# Patient Record
Sex: Female | Born: 1945
Health system: Southern US, Community
[De-identification: ages and names within clinical notes are randomized; demographics above are authoritative.]

## PROBLEM LIST (undated history)

## (undated) DIAGNOSIS — D472 Monoclonal gammopathy: Secondary | ICD-10-CM

## (undated) DIAGNOSIS — G4733 Obstructive sleep apnea (adult) (pediatric): Secondary | ICD-10-CM

## (undated) DIAGNOSIS — H269 Unspecified cataract: Secondary | ICD-10-CM

## (undated) DIAGNOSIS — I5189 Other ill-defined heart diseases: Secondary | ICD-10-CM

## (undated) DIAGNOSIS — G4730 Sleep apnea, unspecified: Secondary | ICD-10-CM

## (undated) DIAGNOSIS — K219 Gastro-esophageal reflux disease without esophagitis: Secondary | ICD-10-CM

## (undated) DIAGNOSIS — H9319 Tinnitus, unspecified ear: Secondary | ICD-10-CM

## (undated) DIAGNOSIS — E7800 Pure hypercholesterolemia, unspecified: Secondary | ICD-10-CM

## (undated) DIAGNOSIS — I700 Atherosclerosis of aorta: Secondary | ICD-10-CM

## (undated) DIAGNOSIS — Z860100 Personal history of colon polyps, unspecified: Secondary | ICD-10-CM

## (undated) DIAGNOSIS — K589 Irritable bowel syndrome without diarrhea: Secondary | ICD-10-CM

## (undated) DIAGNOSIS — I872 Venous insufficiency (chronic) (peripheral): Secondary | ICD-10-CM

## (undated) DIAGNOSIS — K449 Diaphragmatic hernia without obstruction or gangrene: Secondary | ICD-10-CM

## (undated) DIAGNOSIS — I340 Nonrheumatic mitral (valve) insufficiency: Secondary | ICD-10-CM

## (undated) DIAGNOSIS — N189 Chronic kidney disease, unspecified: Secondary | ICD-10-CM

## (undated) DIAGNOSIS — Z8619 Personal history of other infectious and parasitic diseases: Secondary | ICD-10-CM

## (undated) DIAGNOSIS — R112 Nausea with vomiting, unspecified: Secondary | ICD-10-CM

## (undated) DIAGNOSIS — Z9989 Dependence on other enabling machines and devices: Principal | ICD-10-CM

## (undated) DIAGNOSIS — M1990 Unspecified osteoarthritis, unspecified site: Secondary | ICD-10-CM

## (undated) DIAGNOSIS — R519 Headache, unspecified: Secondary | ICD-10-CM

## (undated) DIAGNOSIS — Z9889 Other specified postprocedural states: Secondary | ICD-10-CM

## (undated) DIAGNOSIS — T7840XA Allergy, unspecified, initial encounter: Secondary | ICD-10-CM

## (undated) DIAGNOSIS — R51 Headache: Secondary | ICD-10-CM

## (undated) DIAGNOSIS — M810 Age-related osteoporosis without current pathological fracture: Secondary | ICD-10-CM

## (undated) HISTORY — DX: Irritable bowel syndrome, unspecified: K58.9

## (undated) HISTORY — DX: Dependence on other enabling machines and devices: Z99.89

## (undated) HISTORY — DX: Tinnitus, unspecified ear: H93.19

## (undated) HISTORY — DX: Chronic kidney disease, unspecified: N18.9

## (undated) HISTORY — DX: Headache, unspecified: R51.9

## (undated) HISTORY — DX: Personal history of colon polyps, unspecified: Z86.0100

## (undated) HISTORY — DX: Personal history of colonic polyps: Z86.010

## (undated) HISTORY — DX: Personal history of other infectious and parasitic diseases: Z86.19

## (undated) HISTORY — DX: Allergy, unspecified, initial encounter: T78.40XA

## (undated) HISTORY — PX: ABDOMINAL HYSTERECTOMY: SHX81

## (undated) HISTORY — DX: Obstructive sleep apnea (adult) (pediatric): G47.33

## (undated) HISTORY — PX: DILATION AND CURETTAGE OF UTERUS: SHX78

## (undated) HISTORY — PX: OTHER SURGICAL HISTORY: SHX169

## (undated) HISTORY — PX: TONSILLECTOMY: SUR1361

## (undated) HISTORY — DX: Headache: R51

---

## 2005-12-09 DIAGNOSIS — K2960 Other gastritis without bleeding: Secondary | ICD-10-CM | POA: Insufficient documentation

## 2006-12-10 DIAGNOSIS — K449 Diaphragmatic hernia without obstruction or gangrene: Secondary | ICD-10-CM | POA: Insufficient documentation

## 2016-02-14 DIAGNOSIS — K5909 Other constipation: Secondary | ICD-10-CM | POA: Insufficient documentation

## 2016-02-14 DIAGNOSIS — K219 Gastro-esophageal reflux disease without esophagitis: Secondary | ICD-10-CM | POA: Insufficient documentation

## 2016-03-01 ENCOUNTER — Encounter: Payer: Self-pay | Admitting: Emergency Medicine

## 2016-03-01 ENCOUNTER — Emergency Department: Payer: Medicare Other

## 2016-03-01 ENCOUNTER — Emergency Department
Admission: EM | Admit: 2016-03-01 | Discharge: 2016-03-01 | Disposition: A | Discharge status: Home / Self Care | Payer: Medicare Other | Attending: Emergency Medicine | Admitting: Emergency Medicine

## 2016-03-01 DIAGNOSIS — R05 Cough: Secondary | ICD-10-CM | POA: Diagnosis not present

## 2016-03-01 DIAGNOSIS — R059 Cough, unspecified: Secondary | ICD-10-CM

## 2016-03-01 DIAGNOSIS — R197 Diarrhea, unspecified: Secondary | ICD-10-CM | POA: Diagnosis not present

## 2016-03-01 DIAGNOSIS — Z87891 Personal history of nicotine dependence: Secondary | ICD-10-CM | POA: Diagnosis not present

## 2016-03-01 DIAGNOSIS — Z79899 Other long term (current) drug therapy: Secondary | ICD-10-CM | POA: Insufficient documentation

## 2016-03-01 HISTORY — DX: Unspecified osteoarthritis, unspecified site: M19.90

## 2016-03-01 HISTORY — DX: Age-related osteoporosis without current pathological fracture: M81.0

## 2016-03-01 HISTORY — DX: Pure hypercholesterolemia, unspecified: E78.00

## 2016-03-01 LAB — COMPREHENSIVE METABOLIC PANEL
ALBUMIN: 3.7 g/dL (ref 3.5–5.0)
ALT: 17 U/L (ref 14–54)
AST: 26 U/L (ref 15–41)
Alkaline Phosphatase: 51 U/L (ref 38–126)
Anion gap: 9 (ref 5–15)
BUN: 17 mg/dL (ref 6–20)
CHLORIDE: 104 mmol/L (ref 101–111)
CO2: 21 mmol/L — ABNORMAL LOW (ref 22–32)
CREATININE: 0.9 mg/dL (ref 0.44–1.00)
Calcium: 8.5 mg/dL — ABNORMAL LOW (ref 8.9–10.3)
GFR calc Af Amer: >60 mL/min (ref >60)
GLUCOSE: 254 mg/dL — AB (ref 65–99)
POTASSIUM: 3.7 mmol/L (ref 3.5–5.1)
Sodium: 134 mmol/L — ABNORMAL LOW (ref 135–145)
Total Bilirubin: 0.5 mg/dL (ref 0.3–1.2)
Total Protein: 6.8 g/dL (ref 6.5–8.1)

## 2016-03-01 LAB — CBC WITH DIFFERENTIAL/PLATELET
Basophils Absolute: 0 10*3/uL (ref 0–0.1)
Basophils Relative: 1 %
EOS PCT: 0 %
Eosinophils Absolute: 0 10*3/uL (ref 0–0.7)
HEMATOCRIT: 39.7 % (ref 35.0–47.0)
Hemoglobin: 13.3 g/dL (ref 12.0–16.0)
LYMPHS ABS: 1.4 10*3/uL (ref 1.0–3.6)
LYMPHS PCT: 45 %
MCH: 30.2 pg (ref 26.0–34.0)
MCHC: 33.4 g/dL (ref 32.0–36.0)
MCV: 90.3 fL (ref 80.0–100.0)
MONO ABS: 0.1 10*3/uL — AB (ref 0.2–0.9)
MONOS PCT: 4 %
NEUTROS ABS: 1.5 10*3/uL (ref 1.4–6.5)
Neutrophils Relative %: 50 %
PLATELETS: 161 10*3/uL (ref 150–440)
RBC: 4.39 MIL/uL (ref 3.80–5.20)
RDW: 13.2 % (ref 11.5–14.5)
WBC: 3.1 10*3/uL — ABNORMAL LOW (ref 3.6–11.0)

## 2016-03-01 LAB — LIPASE, BLOOD: LIPASE: 63 U/L — AB (ref 11–51)

## 2016-03-01 LAB — GLUCOSE, CAPILLARY: Glucose-Capillary: 107 mg/dL — ABNORMAL HIGH (ref 65–99)

## 2016-03-01 MED ORDER — ALBUTEROL SULFATE HFA 108 (90 BASE) MCG/ACT IN AERS: 2.0000 | INHALATION_SPRAY | Freq: Four times a day (QID) | RESPIRATORY_TRACT | Status: DC | PRN

## 2016-03-01 MED ORDER — SODIUM CHLORIDE 0.9 % IV BOLUS (SEPSIS)
1000.0000 mL | Freq: Once | INTRAVENOUS | Status: AC
  Administered 2016-03-01: 1000 mL via INTRAVENOUS

## 2016-03-01 MED ORDER — IPRATROPIUM-ALBUTEROL 0.5-2.5 (3) MG/3ML IN SOLN
3.0000 mL | Freq: Once | RESPIRATORY_TRACT | Status: AC
  Administered 2016-03-01: 3 mL via RESPIRATORY_TRACT
  Filled 2016-03-01: qty 3

## 2016-03-01 MED ORDER — SODIUM CHLORIDE 0.9 % IV BOLUS (SEPSIS)
500.0000 mL | Freq: Once | INTRAVENOUS | Status: AC
  Administered 2016-03-01: 500 mL via INTRAVENOUS

## 2016-03-01 NOTE — ED Notes (Signed)
Pt in with co diarrhea for few days, today she started feeling dizzy.

## 2016-03-01 NOTE — ED Provider Notes (Addendum)
[facility] Emergency Department Provider Note  ____________________________________________   I have reviewed the triage vital signs and the nursing notes.   HISTORY  Chief Complaint Diarrhea    HPI Carol Stone is a 70 y.o. female presents today complaining of diarrhea. She is largely healthy person. She states she's had diarrhea for the last 3 days. Watery. She has had no recent antibiotics. She's had no recent travel. She has no history of C. difficile. She states that she felt a little lightheaded. She is eating and drinking well and affect medially prior to coming in she had 2 large sugar filled protein  shakes.She denies any fever or chills she denies any abdominal pain, she also states she has a slight cough which is been present for the last week or so which seems to be getting better. It is nonproductive. No fever or chills not vomited with any of these illnesses.  Past Medical History  Diagnosis Date  . Hypercholesteremia   . Arthritis   . Osteoporosis     There are no active problems to display for this patient.   History reviewed. No pertinent past surgical history.  Current Outpatient Rx  Name  Route  Sig  Dispense  Refill  . pravastatin (PRAVACHOL) 40 MG tablet   Oral   Take 40 mg by mouth daily.         . ranitidine (ZANTAC) 150 MG tablet   Oral   Take 150 mg by mouth 2 (two) times daily.         . simethicone (MYLICON) 0000000 MG chewable tablet   Oral   Chew 125 mg by mouth every 6 (six) hours as needed for flatulence.           Allergies Bactrim; Codeine; Compazine; Demerol; Indocin; and Reglan  History reviewed. No pertinent family history.  Social History Social History  Substance Use Topics  . Smoking status: Former Research scientist (life sciences)  . Smokeless tobacco: None  . Alcohol Use: No    Review of Systems Constitutional: No fever/chills Eyes: No visual changes. ENT: No sore throat. No stiff neck no neck  pain Cardiovascular: Denies chest pain. Respiratory: Denies shortness of breath. Gastrointestinal:   no vomiting.  Positive diarrhea.  No constipation. Genitourinary: Negative for dysuria. Musculoskeletal: Negative lower extremity swelling Skin: Negative for rash. Neurological: Negative for headaches, focal weakness or numbness. 10-point ROS otherwise negative.  ____________________________________________   PHYSICAL EXAM:  VITAL SIGNS: ED Triage Vitals  Enc Vitals Group     BP 03/01/16 0647 136/74 mmHg     Pulse Rate 03/01/16 0647 101     Resp 03/01/16 0647 18     Temp 03/01/16 0647 97.7 F (36.5 C)     Temp Source 03/01/16 0647 Oral     SpO2 03/01/16 0647 100 %     Weight 03/01/16 0647 123 lb (55.792 kg)     Height 03/01/16 0647 5\' 1"  (1.549 m)     Head Cir --      Peak Flow --      Pain Score 03/01/16 0648 0     Pain Loc --      Pain Edu? --      Excl. in [place]? --     Constitutional: Alert and oriented. Well appearing and in no acute distress. Eyes: Conjunctivae are normal. PERRL. EOMI. Head: Atraumatic. Nose: No congestion/rhinnorhea. Mouth/Throat: Mucous membranes are moist.  Oropharynx non-erythematous. Neck: No stridor.   Nontender with no meningismus Cardiovascular: Normal rate,  regular rhythm. Grossly normal heart sounds.  Good peripheral circulation. Respiratory: Normal respiratory effort.  No retractions. Lungs CTAB.Occasional dry cough appreciated Abdominal: Soft and nontender. No distention. No guarding no rebound Back:  There is no focal tenderness or step off there is no midline tenderness there are no lesions noted. there is no CVA tenderness Musculoskeletal: No lower extremity tenderness. No joint effusions, no DVT signs strong distal pulses no edema Neurologic:  Normal speech and language. No gross focal neurologic deficits are appreciated.  Skin:  Skin is warm, dry and intact. No rash noted. Psychiatric: Mood and affect are normal. Speech and behavior  are normal.  ____________________________________________   LABS (all labs ordered are listed, but only abnormal results are displayed)  Labs Reviewed  COMPREHENSIVE METABOLIC PANEL - Abnormal; Notable for the following:    Sodium 134 (*)    CO2 21 (*)    Glucose, Bld 254 (*)    Calcium 8.5 (*)    All other components within normal limits  LIPASE, BLOOD - Abnormal; Notable for the following:    Lipase 63 (*)    All other components within normal limits  CBC WITH DIFFERENTIAL/PLATELET - Abnormal; Notable for the following:    WBC 3.1 (*)    Monocytes Absolute 0.1 (*)    All other components within normal limits   ____________________________________________  EKG  I personally interpreted any EKGs ordered by me or triage  ____________________________________________  RADIOLOGY  I reviewed any imaging ordered by me or triage that were performed during my shift and, if possible, patient and/or family made aware of any abnormal findings. ____________________________________________   PROCEDURES  Procedure(s) performed: None  Critical Care performed: None  ____________________________________________   INITIAL IMPRESSION / ASSESSMENT AND PLAN / ED COURSE  Pertinent labs & imaging results that were available during my care of the patient were reviewed by me and considered in my medical decision making (see chart for details).  Patient with diarrheal illness, sugar somewhat up and just had a large amount of sugar to eat. We will recheck that. Giving her copious IV fluids. This is continuously supportive care. Abdomen is completely benign nothing to suggest diverticular other intra-abdominal pathology. No risk factors for C. difficile. Not likely amenable to antibiotics and in fact antibiotics will likely make things worse for her. Very strong correlation with likely viral illness. In addition, patient has a dry cough with no evidence of pneumonia. At her request we'll chest  x-ray although low suspicion for pneumonia.  ----------------------------------------- 9:38 AM on 03/01/2016 -----------------------------------------  Patient feels much better, we will check orthostatic vital signs after IV fluid is done. ____________________________________________   FINAL CLINICAL IMPRESSION(S) / ED DIAGNOSES  Final diagnoses:  None      This chart was dictated using voice recognition software.  Despite best efforts to proofread,  errors can occur which can change meaning.     Schuyler Amor, MD 03/01/16 [id number]  Schuyler Amor, MD 03/01/16 [phone]

## 2016-03-03 ENCOUNTER — Emergency Department: Payer: Medicare Other

## 2016-03-03 ENCOUNTER — Encounter: Payer: Self-pay | Admitting: Emergency Medicine

## 2016-03-03 ENCOUNTER — Emergency Department
Admission: EM | Admit: 2016-03-03 | Discharge: 2016-03-04 | Disposition: A | Discharge status: Home / Self Care | Payer: Medicare Other | Attending: Emergency Medicine | Admitting: Emergency Medicine

## 2016-03-03 DIAGNOSIS — K219 Gastro-esophageal reflux disease without esophagitis: Secondary | ICD-10-CM | POA: Diagnosis not present

## 2016-03-03 DIAGNOSIS — M1990 Unspecified osteoarthritis, unspecified site: Secondary | ICD-10-CM | POA: Insufficient documentation

## 2016-03-03 DIAGNOSIS — Z79899 Other long term (current) drug therapy: Secondary | ICD-10-CM | POA: Insufficient documentation

## 2016-03-03 DIAGNOSIS — M810 Age-related osteoporosis without current pathological fracture: Secondary | ICD-10-CM | POA: Diagnosis not present

## 2016-03-03 DIAGNOSIS — R0602 Shortness of breath: Secondary | ICD-10-CM | POA: Diagnosis present

## 2016-03-03 DIAGNOSIS — E782 Mixed hyperlipidemia: Secondary | ICD-10-CM | POA: Insufficient documentation

## 2016-03-03 DIAGNOSIS — K529 Noninfective gastroenteritis and colitis, unspecified: Secondary | ICD-10-CM

## 2016-03-03 DIAGNOSIS — Z87891 Personal history of nicotine dependence: Secondary | ICD-10-CM | POA: Insufficient documentation

## 2016-03-03 DIAGNOSIS — R197 Diarrhea, unspecified: Secondary | ICD-10-CM | POA: Diagnosis not present

## 2016-03-03 HISTORY — DX: Gastro-esophageal reflux disease without esophagitis: K21.9

## 2016-03-03 LAB — GASTROINTESTINAL PANEL BY PCR, STOOL (REPLACES STOOL CULTURE)
ASTROVIRUS: NOT DETECTED
Adenovirus F40/41: NOT DETECTED
Campylobacter species: NOT DETECTED
Cryptosporidium: NOT DETECTED
Cyclospora cayetanensis: NOT DETECTED
E. COLI O157: NOT DETECTED
ENTAMOEBA HISTOLYTICA: NOT DETECTED
ENTEROAGGREGATIVE E COLI (EAEC): NOT DETECTED
ENTEROTOXIGENIC E COLI (ETEC): NOT DETECTED
Enteropathogenic E coli (EPEC): NOT DETECTED
GIARDIA LAMBLIA: NOT DETECTED
NOROVIRUS GI/GII: NOT DETECTED
Plesimonas shigelloides: NOT DETECTED
Rotavirus A: NOT DETECTED
SHIGA LIKE TOXIN PRODUCING E COLI (STEC): NOT DETECTED
SHIGELLA/ENTEROINVASIVE E COLI (EIEC): NOT DETECTED
Salmonella species: NOT DETECTED
Sapovirus (I, II, IV, and V): NOT DETECTED
VIBRIO CHOLERAE: NOT DETECTED
Vibrio species: NOT DETECTED
Yersinia enterocolitica: NOT DETECTED

## 2016-03-03 LAB — COMPREHENSIVE METABOLIC PANEL
ALT: 32 U/L (ref 14–54)
AST: 40 U/L (ref 15–41)
Albumin: 4.1 g/dL (ref 3.5–5.0)
Alkaline Phosphatase: 63 U/L (ref 38–126)
Anion gap: 12 (ref 5–15)
BILIRUBIN TOTAL: 0.6 mg/dL (ref 0.3–1.2)
BUN: 10 mg/dL (ref 6–20)
CHLORIDE: 104 mmol/L (ref 101–111)
CO2: 19 mmol/L — ABNORMAL LOW (ref 22–32)
Calcium: 9.4 mg/dL (ref 8.9–10.3)
Creatinine, Ser: 0.68 mg/dL (ref 0.44–1.00)
Glucose, Bld: 103 mg/dL — ABNORMAL HIGH (ref 65–99)
POTASSIUM: 3.1 mmol/L — AB (ref 3.5–5.1)
Sodium: 135 mmol/L (ref 135–145)
TOTAL PROTEIN: 7.4 g/dL (ref 6.5–8.1)

## 2016-03-03 LAB — CBC
HEMATOCRIT: 41.4 % (ref 35.0–47.0)
Hemoglobin: 14.1 g/dL (ref 12.0–16.0)
MCH: 30.2 pg (ref 26.0–34.0)
MCHC: 34.2 g/dL (ref 32.0–36.0)
MCV: 88.5 fL (ref 80.0–100.0)
PLATELETS: 216 10*3/uL (ref 150–440)
RBC: 4.67 MIL/uL (ref 3.80–5.20)
RDW: 13 % (ref 11.5–14.5)
WBC: 3.7 10*3/uL (ref 3.6–11.0)

## 2016-03-03 LAB — C DIFFICILE QUICK SCREEN W PCR REFLEX
C DIFFICLE (CDIFF) ANTIGEN: NEGATIVE
C Diff interpretation: NEGATIVE
C Diff toxin: NEGATIVE

## 2016-03-03 LAB — TROPONIN I: Troponin I: <0.03 ng/mL (ref <0.031)

## 2016-03-03 MED ORDER — DIATRIZOATE MEGLUMINE & SODIUM 66-10 % PO SOLN
15.0000 mL | ORAL | Status: AC
  Administered 2016-03-03: 15 mL via ORAL

## 2016-03-03 MED ORDER — SODIUM CHLORIDE 0.9 % IV BOLUS (SEPSIS)
500.0000 mL | INTRAVENOUS | Status: AC
  Administered 2016-03-03: 500 mL via INTRAVENOUS

## 2016-03-03 MED ORDER — POTASSIUM CHLORIDE CRYS ER 20 MEQ PO TBCR
40.0000 meq | EXTENDED_RELEASE_TABLET | Freq: Once | ORAL | Status: AC
  Administered 2016-03-03: 40 meq via ORAL
  Filled 2016-03-03: qty 2

## 2016-03-03 MED ORDER — IOPAMIDOL (ISOVUE-370) INJECTION 76%
100.0000 mL | Freq: Once | INTRAVENOUS | Status: AC | PRN
  Administered 2016-03-03: 100 mL via INTRAVENOUS

## 2016-03-03 NOTE — ED Notes (Addendum)
Patient presents to the ED with tachypnea and shortness of breath since 2pm.  Patient was seen on Friday for shortness of breath and congestion with diarrhea x 6 days.  Patient reports the diarrhea has continued but is getting much better.  Patient appears very winded and her face appears reddened.  Patient denies any pain.

## 2016-03-03 NOTE — ED Provider Notes (Signed)
[facility] Emergency Department Provider Note  ____________________________________________  Time seen: Approximately 7:23 PM  I have reviewed the triage vital signs and the nursing notes.   HISTORY  Chief Complaint Shortness of Breath    HPI MAETTA BRANDSTETTER is a 70 y.o. female presents with persistent diarrhea for nearly a week.  She was seen about 3 days ago in the emergency Department for the same complaint.  The etiology of her diarrheal illness was unclear as she has no risk factors for C. difficile, no abdominal tenderness, no recent known sick contacts.  It was presumably a viral illness.  She was also noted to have a dry cough with no evidence of pneumonia and was given albuterol inhaler to try and help with a small amount of wheezing.  It was back today with persistent copious watery diarrhea with no blood.  It is greatly impacting her daily life as she is having so many episodes of bowel movements.  She continues to have no abdominal pain, just some mild cramping at times.  Additionally she is feeling increasingly short of breath and is noted to have tachypnea and even pursed lips upon arrival to the emergency department.  Overall she describes her symptoms as severe and nothing is making them better.  She wonders if the albuterol is actually making her feel worse.  She denies fever/chills, chest pain, dysuria.  She also denies N/V.   Past Medical History  Diagnosis Date  . Hypercholesteremia   . Arthritis   . Osteoporosis   . GERD (gastroesophageal reflux disease)     There are no active problems to display for this patient.   Past Surgical History  Procedure Laterality Date  . Abdominal hysterectomy      Current Outpatient Rx  Name  Route  Sig  Dispense  Refill  . pravastatin (PRAVACHOL) 40 MG tablet   Oral   Take 40 mg by mouth daily.         . ranitidine (ZANTAC) 150 MG tablet   Oral   Take 150 mg by mouth 2 (two) times daily.          . simethicone (MYLICON) 0000000 MG chewable tablet   Oral   Chew 125 mg by mouth every 6 (six) hours as needed for flatulence.         Marland Kitchen albuterol (PROVENTIL HFA;VENTOLIN HFA) 108 (90 Base) MCG/ACT inhaler   Inhalation   Inhale 2 puffs into the lungs every 6 (six) hours as needed for wheezing or shortness of breath.   1 Inhaler   2     Allergies Bactrim; Codeine; Compazine; Demerol; Indocin; and Reglan  No family history on file.  Social History Social History  Substance Use Topics  . Smoking status: Former Research scientist (life sciences)  . Smokeless tobacco: None  . Alcohol Use: No    Review of Systems Constitutional: No fever/chills Eyes: No visual changes. ENT: No sore throat. Cardiovascular: Denies chest pain. Respiratory: Denies shortness of breath. Gastrointestinal: No abdominal pain except mild intermittent cramping.  Copious watery diarrhea, no N/V. Genitourinary: Negative for dysuria. Musculoskeletal: Negative for back pain. Skin: Negative for rash. Neurological: Negative for headaches, focal weakness or numbness.  10-point ROS otherwise negative.  ____________________________________________   PHYSICAL EXAM:  VITAL SIGNS: ED Triage Vitals  Enc Vitals Group     BP 03/03/16 1734 154/78 mmHg     Pulse Rate 03/03/16 1734 94     Resp 03/03/16 1734 28     Temp 03/03/16  1734 97.5 F (36.4 C)     Temp Source 03/03/16 1734 Oral     SpO2 03/03/16 1734 100 %     Weight 03/03/16 1734 121 lb (54.885 kg)     Height 03/03/16 1734 5\' 1"  (1.549 m)     Head Cir --      Peak Flow --      Pain Score 03/03/16 1736 0     Pain Loc --      Pain Edu? --      Excl. in [place]? --     Constitutional: Alert and oriented. Well appearing in general but breathing rapidly and pursing lips at times. Eyes: Conjunctivae are normal. PERRL. EOMI. Head: Atraumatic. Nose: No congestion/rhinnorhea. Mouth/Throat: Mucous membranes are moist.  Oropharynx non-erythematous. Neck: No stridor.  No  meningeal signs.   Cardiovascular: Normal rate, regular rhythm. Good peripheral circulation. Grossly normal heart sounds.   Respiratory: Normal respiratory effort.  No retractions. Lungs CTAB. Gastrointestinal: Soft and nontender. No distention.  Musculoskeletal: No lower extremity tenderness nor edema. No gross deformities of extremities. Neurologic:  Normal speech and language. No gross focal neurologic deficits are appreciated.  Skin:  Skin is warm, dry and intact. No rash noted. Psychiatric: Mood and affect are normal. Speech and behavior are normal.  ____________________________________________   LABS (all labs ordered are listed, but only abnormal results are displayed)  Labs Reviewed  COMPREHENSIVE METABOLIC PANEL - Abnormal; Notable for the following:    Potassium 3.1 (*)    CO2 19 (*)    Glucose, Bld 103 (*)    All other components within normal limits  GASTROINTESTINAL PANEL BY PCR, STOOL (REPLACES STOOL CULTURE)  C DIFFICILE QUICK SCREEN W PCR REFLEX  CBC  TROPONIN I  URINALYSIS COMPLETEWITH MICROSCOPIC ([facility] ONLY)   ____________________________________________  EKG  ED ECG REPORT I, Reeder Brisby, the attending physician, personally viewed and interpreted this ECG.   Date: 03/03/2016  EKG Time: 17:33  Rate: 93  Rhythm: normal sinus rhythm  Axis: Normal  Intervals: Normal  ST&T Change: No evidence of acute ischemia  ____________________________________________  RADIOLOGY   Dg Chest 2 View  03/03/2016  CLINICAL DATA:  Worsening shortness of breath EXAM: CHEST  2 VIEW COMPARISON:  03/01/2016 FINDINGS: Lungs are clear.  No pleural effusion or pneumothorax. The heart is normal in size. Visualized osseous structures are within normal limits. IMPRESSION: No evidence of acute cardiopulmonary disease. Electronically Signed   By: Julian Hy M.D.   On: 03/03/2016 18:28    ____________________________________________   PROCEDURES  Procedure(s)  performed: None  Critical Care performed: No ____________________________________________   INITIAL IMPRESSION / ASSESSMENT AND PLAN / ED COURSE  Pertinent labs & imaging results that were available during my care of the patient were reviewed by me and considered in my medical decision making (see chart for details).  The patient has tachypnea consistently in the mid to upper 20s when I have evaluated her.  She purses her lips at times but has an unremarkable pulmonary exam.  Her chest x-ray is unremarkable.  It is possible that anxiety is congested into it, but the way she describes feeling winded like she has just exercise and cannot catch her breath, I am concerned about the possibility of a pulmonary embolism.  Additionally, I have evaluated her for diarrhea, but the GI pathogen panel is negative, greatly reducing the possibility that she has a specific viral or bacterial illness.  She has no significant tenderness to palpation.  However I  do not have a good reason for her diarrhea which is greatly affecting her life at this point.  I discussed with patient and her husband agreed to my plan of a CTA chest for rule out pulmonary embolism and CT abdomen and pelvis with by mouth and IV contrast to further evaluate the etiology of the diarrhea.  Transferred ED care to Dr. Marcelene Butte for ED follow up of CTA chest, CT abd/pelvis, and urinalysis.  ____________________________________________  FINAL CLINICAL IMPRESSION(S) / ED DIAGNOSES  Final diagnoses:  Severe diarrhea  Shortness of breath      NEW MEDICATIONS STARTED DURING THIS VISIT:  New Prescriptions   No medications on file      Note:  This document was prepared using Dragon voice recognition software and may include unintentional dictation errors.   Hinda Kehr, MD 03/03/16 2102

## 2016-03-04 DIAGNOSIS — R0602 Shortness of breath: Secondary | ICD-10-CM | POA: Diagnosis not present

## 2016-03-04 LAB — URINALYSIS COMPLETE WITH MICROSCOPIC (ARMC ONLY)
BACTERIA UA: NONE SEEN
BILIRUBIN URINE: NEGATIVE
GLUCOSE, UA: NEGATIVE mg/dL
Hgb urine dipstick: NEGATIVE
Leukocytes, UA: NEGATIVE
Nitrite: NEGATIVE
PH: 9 — AB (ref 5.0–8.0)
Protein, ur: NEGATIVE mg/dL
SQUAMOUS EPITHELIAL / LPF: NONE SEEN
Specific Gravity, Urine: 1.005 (ref 1.005–1.030)

## 2016-03-04 MED ORDER — LEVALBUTEROL TARTRATE 45 MCG/ACT IN AERO
2.0000 | INHALATION_SPRAY | RESPIRATORY_TRACT | Status: DC | PRN
Start: 2016-03-04 — End: 2017-04-18

## 2016-03-04 MED ORDER — METHYLPREDNISOLONE 4 MG PO TBPK: ORAL_TABLET | ORAL | Status: DC

## 2016-03-04 MED ORDER — CIPROFLOXACIN HCL 500 MG PO TABS
500.0000 mg | ORAL_TABLET | Freq: Once | ORAL | Status: AC
  Administered 2016-03-04: 500 mg via ORAL
  Filled 2016-03-04: qty 1

## 2016-03-04 MED ORDER — POTASSIUM CHLORIDE CRYS ER 20 MEQ PO TBCR: 40.0000 meq | EXTENDED_RELEASE_TABLET | Freq: Once | ORAL | Status: DC

## 2016-03-04 MED ORDER — CIPROFLOXACIN HCL 500 MG PO TABS: 500.0000 mg | ORAL_TABLET | Freq: Two times a day (BID) | ORAL | Status: DC

## 2016-03-04 NOTE — ED Provider Notes (Signed)
-----------------------------------------   23:45 PM on 03/04/2016 -----------------------------------------  Care assumed from Dr. Marcelene Butte. In short, this is a 70 year old female who presents with persistent diarrhea for nearly 1 week. She has had a prior recent emergency department visit for the same complaints. She is also having a dry cough for the past several days and was given an albuterol inhaler to help with her small amount of wheezing. Thus far, her biofire panel has been negative and she is pending CTA chest, CT abdomen/pelvis as well as urinalysis.  ----------------------------------------- 1:57 AM on 03/04/2016 -----------------------------------------  Updated patient and spouse of all results and had a lengthy discussion of her symptoms and course of management. For her wheezing and mild bronchiectasis, will start patient on Medrol dose pack. She feels very anxious while taking the albuterol inhaler; I will change this to Xopenex to lessen the side effects. As for the continued diarrhea, although the biofire panel and C. difficile are negative, we will try a three-day course of Cipro to see if this lessens patient's symptoms. Patient already received potassium replacement while in the emergency department. She will follow up closely with her PCP next week. Strict return precautions given. Both verbalize understanding the plan of care.  CTA chest, abdomen/pelvis interpreted per Dr. Radene Knee: 1. No evidence of pulmonary embolus. 2. Mild bilateral bronchiectasis noted. Lungs otherwise clear. 3. Dense calcification noted at the mitral valve. 4. Scattered diverticulosis along the mid sigmoid colon, without evidence of diverticulitis.  Paulette Blanch, MD 03/04/16 [phone]

## 2016-03-04 NOTE — Discharge Instructions (Signed)
1. Take antibiotic as prescribed (Cipro 500mg  twice daily x 3 days). 2. Take steroid taper as prescribed (Medrol dose pack). 3. Use Xopenex inhaler 2 puffs every 4 hours as need for wheezing. Use this instead of the albuterol inhaler. 4. Eat a BRAT diet for 1 week, then slowly advance diet as tolerated. 5. Return to the ER for worsening symptoms, persistent vomiting, difficult breathing or other concerns.  Food Choices to Help Relieve Diarrhea, Adult When you have diarrhea, the foods you eat and your eating habits are very important. Choosing the right foods and drinks can help relieve diarrhea. Also, because diarrhea can last up to 7 days, you need to replace lost fluids and electrolytes (such as sodium, potassium, and chloride) in order to help prevent dehydration.  WHAT GENERAL GUIDELINES DO I NEED TO FOLLOW?  Slowly drink 1 cup (8 oz) of fluid for each episode of diarrhea. If you are getting enough fluid, your urine will be clear or pale yellow.  Eat starchy foods. Some good choices include white rice, white toast, pasta, low-fiber cereal, baked potatoes (without the skin), saltine crackers, and bagels.  Avoid large servings of any cooked vegetables.  Limit fruit to two servings per day. A serving is  cup or 1 small piece.  Choose foods with less than 2 g of fiber per serving.  Limit fats to less than 8 tsp (38 g) per day.  Avoid fried foods.  Eat foods that have probiotics in them. Probiotics can be found in certain dairy products.  Avoid foods and beverages that may increase the speed at which food moves through the stomach and intestines (gastrointestinal tract). Things to avoid include:  High-fiber foods, such as dried fruit, raw fruits and vegetables, nuts, seeds, and whole grain foods.  Spicy foods and high-fat foods.  Foods and beverages sweetened with high-fructose corn syrup, honey, or sugar alcohols such as xylitol, sorbitol, and mannitol. WHAT FOODS ARE  RECOMMENDED? Grains White rice. White, [place], or pita breads (fresh or toasted), including plain rolls, buns, or bagels. White pasta. Saltine, soda, or graham crackers. Pretzels. Low-fiber cereal. Cooked cereals made with water (such as cornmeal, farina, or cream cereals). Plain muffins. Matzo. Melba toast. Zwieback.  Vegetables Potatoes (without the skin). Strained tomato and vegetable juices. Most well-cooked and canned vegetables without seeds. Tender lettuce. Fruits Cooked or canned applesauce, apricots, cherries, fruit cocktail, grapefruit, peaches, pears, or plums. Fresh bananas, apples without skin, cherries, grapes, cantaloupe, grapefruit, peaches, oranges, or plums.  Meat and Other Protein Products Baked or boiled chicken. Eggs. Tofu. Fish. Seafood. Smooth peanut butter. Ground or well-cooked tender beef, ham, veal, lamb, pork, or poultry.  Dairy Plain yogurt, kefir, and unsweetened liquid yogurt. Lactose-free milk, buttermilk, or soy milk. Plain hard cheese. Beverages Sport drinks. Clear broths. Diluted fruit juices (except prune). Regular, caffeine-free sodas such as ginger ale. Water. Decaffeinated teas. Oral rehydration solutions. Sugar-free beverages not sweetened with sugar alcohols. Other Bouillon, broth, or soups made from recommended foods.  The items listed above may not be a complete list of recommended foods or beverages. Contact your dietitian for more options. WHAT FOODS ARE NOT RECOMMENDED? Grains Whole grain, whole wheat, bran, or rye breads, rolls, pastas, crackers, and cereals. Wild or brown rice. Cereals that contain more than 2 g of fiber per serving. Corn tortillas or taco shells. Cooked or dry oatmeal. Granola. Popcorn. Vegetables Raw vegetables. Cabbage, broccoli, Brussels sprouts, artichokes, baked beans, beet greens, corn, kale, legumes, peas, sweet potatoes, and yams. Potato skins.  Cooked spinach and cabbage. Fruits Dried fruit, including raisins and dates.  Raw fruits. Stewed or dried prunes. Fresh apples with skin, apricots, mangoes, pears, raspberries, and strawberries.  Meat and Other Protein Products Chunky peanut butter. Nuts and seeds. Beans and lentils. Berniece Salines.  Dairy High-fat cheeses. Milk, chocolate milk, and beverages made with milk, such as milk shakes. Cream. Ice cream. Sweets and Desserts Sweet rolls, doughnuts, and sweet breads. Pancakes and waffles. Fats and Oils Butter. Cream sauces. Margarine. Salad oils. Plain salad dressings. Olives. Avocados.  Beverages Caffeinated beverages (such as coffee, tea, soda, or energy drinks). Alcoholic beverages. Fruit juices with pulp. Prune juice. Soft drinks sweetened with high-fructose corn syrup or sugar alcohols. Other Coconut. Hot sauce. Chili powder. Mayonnaise. Gravy. Cream-based or milk-based soups.  The items listed above may not be a complete list of foods and beverages to avoid. Contact your dietitian for more information. WHAT SHOULD I DO IF I BECOME DEHYDRATED? Diarrhea can sometimes lead to dehydration. Signs of dehydration include dark urine and dry mouth and skin. If you think you are dehydrated, you should rehydrate with an oral rehydration solution. These solutions can be purchased at pharmacies, retail stores, or online.  Drink -1 cup (120-240 mL) of oral rehydration solution each time you have an episode of diarrhea. If drinking this amount makes your diarrhea worse, try drinking smaller amounts more often. For example, drink 1-3 tsp (5-15 mL) every 5-10 minutes.  A general rule for staying hydrated is to drink 1-2 L of fluid per day. Talk to your health care provider about the specific amount you should be drinking each day. Drink enough fluids to keep your urine clear or pale yellow.   This information is not intended to replace advice given to you by your health care provider. Make sure you discuss any questions you have with your health care provider.   Document Released:  02/15/2004 Document Revised: 12/16/2014 Document Reviewed: 10/18/2013 Elsevier Interactive Patient Education 2016 [place] of Breath Shortness of breath means you have trouble breathing. Shortness of breath needs medical care right away. HOME CARE   Do not smoke.  Avoid being around chemicals or things (paint fumes, dust) that may bother your breathing.  Rest as needed. Slowly begin your normal activities.  Only take medicines as told by your doctor.  Keep all doctor visits as told. GET HELP RIGHT AWAY IF:   Your shortness of breath gets worse.  You feel lightheaded, pass out (faint), or have a cough that is not helped by medicine.  You cough up blood.  You have pain with breathing.  You have pain in your chest, arms, shoulders, or belly (abdomen).  You have a fever.  You cannot walk up stairs or exercise the way you normally do.  You do not get better in the time expected.  You have a hard time doing normal activities even with rest.  You have problems with your medicines.  You have any new symptoms. MAKE SURE YOU:  Understand these instructions.  Will watch your condition.  Will get help right away if you are not doing well or get worse.   This information is not intended to replace advice given to you by your health care provider. Make sure you discuss any questions you have with your health care provider.   Document Released: 05/13/2008 Document Revised: 11/30/2013 Document Reviewed: 02/10/2012 Elsevier Interactive Patient Education [employer].

## 2016-03-13 ENCOUNTER — Encounter: Payer: Self-pay | Admitting: *Deleted

## 2016-03-14 ENCOUNTER — Ambulatory Visit
Admission: RE | Admit: 2016-03-14 | Discharge: 2016-03-14 | Disposition: A | Discharge status: Home / Self Care | Payer: Medicare Other | Source: Ambulatory Visit | Attending: Gastroenterology | Admitting: Gastroenterology

## 2016-03-14 ENCOUNTER — Ambulatory Visit: Payer: Medicare Other | Admitting: Anesthesiology

## 2016-03-14 ENCOUNTER — Encounter
Admission: RE | Disposition: A | Discharge status: Home / Self Care | Payer: Self-pay | Source: Ambulatory Visit | Attending: Gastroenterology

## 2016-03-14 ENCOUNTER — Encounter: Payer: Self-pay | Admitting: *Deleted

## 2016-03-14 DIAGNOSIS — Z79899 Other long term (current) drug therapy: Secondary | ICD-10-CM | POA: Insufficient documentation

## 2016-03-14 DIAGNOSIS — I739 Peripheral vascular disease, unspecified: Secondary | ICD-10-CM | POA: Insufficient documentation

## 2016-03-14 DIAGNOSIS — Z87891 Personal history of nicotine dependence: Secondary | ICD-10-CM | POA: Insufficient documentation

## 2016-03-14 DIAGNOSIS — Z882 Allergy status to sulfonamides status: Secondary | ICD-10-CM | POA: Diagnosis not present

## 2016-03-14 DIAGNOSIS — E7800 Pure hypercholesterolemia, unspecified: Secondary | ICD-10-CM | POA: Insufficient documentation

## 2016-03-14 DIAGNOSIS — R197 Diarrhea, unspecified: Secondary | ICD-10-CM | POA: Diagnosis not present

## 2016-03-14 DIAGNOSIS — G4730 Sleep apnea, unspecified: Secondary | ICD-10-CM | POA: Insufficient documentation

## 2016-03-14 DIAGNOSIS — K219 Gastro-esophageal reflux disease without esophagitis: Secondary | ICD-10-CM | POA: Diagnosis not present

## 2016-03-14 DIAGNOSIS — Z90710 Acquired absence of both cervix and uterus: Secondary | ICD-10-CM | POA: Insufficient documentation

## 2016-03-14 DIAGNOSIS — K5730 Diverticulosis of large intestine without perforation or abscess without bleeding: Secondary | ICD-10-CM | POA: Diagnosis not present

## 2016-03-14 DIAGNOSIS — M1990 Unspecified osteoarthritis, unspecified site: Secondary | ICD-10-CM | POA: Insufficient documentation

## 2016-03-14 DIAGNOSIS — M810 Age-related osteoporosis without current pathological fracture: Secondary | ICD-10-CM | POA: Diagnosis not present

## 2016-03-14 DIAGNOSIS — Z888 Allergy status to other drugs, medicaments and biological substances status: Secondary | ICD-10-CM | POA: Insufficient documentation

## 2016-03-14 DIAGNOSIS — Z885 Allergy status to narcotic agent status: Secondary | ICD-10-CM | POA: Insufficient documentation

## 2016-03-14 HISTORY — DX: Sleep apnea, unspecified: G47.30

## 2016-03-14 HISTORY — DX: Venous insufficiency (chronic) (peripheral): I87.2

## 2016-03-14 HISTORY — DX: Unspecified cataract: H26.9

## 2016-03-14 HISTORY — PX: COLONOSCOPY WITH PROPOFOL: SHX5780

## 2016-03-14 SURGERY — COLONOSCOPY WITH PROPOFOL
Anesthesia: General

## 2016-03-14 MED ORDER — SODIUM CHLORIDE 0.9 % IV SOLN
INTRAVENOUS | Status: DC
  Administered 2016-03-14: 1000 mL via INTRAVENOUS

## 2016-03-14 MED ORDER — LIDOCAINE HCL (CARDIAC) 20 MG/ML IV SOLN
INTRAVENOUS | Status: DC | PRN
  Administered 2016-03-14: 100 mg via INTRAVENOUS

## 2016-03-14 MED ORDER — PROPOFOL 10 MG/ML IV BOLUS
INTRAVENOUS | Status: DC | PRN
  Administered 2016-03-14: 30 mg via INTRAVENOUS
  Administered 2016-03-14: 60 mg via INTRAVENOUS

## 2016-03-14 MED ORDER — PROPOFOL 500 MG/50ML IV EMUL
INTRAVENOUS | Status: DC | PRN
  Administered 2016-03-14: 100 ug/kg/min via INTRAVENOUS

## 2016-03-14 MED ORDER — SODIUM CHLORIDE 0.9 % IV SOLN: INTRAVENOUS | Status: DC

## 2016-03-14 NOTE — Anesthesia Postprocedure Evaluation (Signed)
Anesthesia Post Note  Patient: ANU CAMILLERI  Procedure(s) Performed: Procedure(s) (LRB): COLONOSCOPY WITH PROPOFOL (N/A)  Patient location during evaluation: PACU Anesthesia Type: General Level of consciousness: awake and alert Pain management: pain level controlled Vital Signs Assessment: post-procedure vital signs reviewed and stable Respiratory status: spontaneous breathing, nonlabored ventilation, respiratory function stable and patient connected to nasal cannula oxygen Cardiovascular status: blood pressure returned to baseline and stable Postop Assessment: no signs of nausea or vomiting Anesthetic complications: no    Last Vitals:  Filed Vitals:   03/14/16 1425 03/14/16 1622  BP: 161/78 101/53  Pulse: 104 84  Temp: 36.4 C 36 C  Resp: 20 15    Last Pain: There were no vitals filed for this visit.               Molli Barrows

## 2016-03-14 NOTE — Transfer of Care (Signed)
Immediate Anesthesia Transfer of Care Note  Patient: Carol Stone  Procedure(s) Performed: Procedure(s): COLONOSCOPY WITH PROPOFOL (N/A)  Patient Location: PACU  Anesthesia Type:General  Level of Consciousness: sedated  Airway & Oxygen Therapy: Patient Spontanous Breathing and Patient connected to nasal cannula oxygen  Post-op Assessment: Report given to RN and Post -op Vital signs reviewed and stable  Post vital signs: Reviewed and stable  Last Vitals:  Filed Vitals:   03/14/16 1425  BP: 161/78  Pulse: 104  Temp: 36.4 C  Resp: 20    Complications: No apparent anesthesia complications

## 2016-03-14 NOTE — Op Note (Signed)
[facility] Patient Name: Carol Stone Procedure Date: 03/14/2016 3:58 PM MRN: [id number] Account #: [id number] Date of Birth: [birth year] Admit Type: Outpatient Age: 70 Room: [facility] ENDO ROOM 4 Gender: Female Note Status: Finalized Procedure:            Colonoscopy Indications:          Diarrhea, Diarhea stopped after one day of medrol dose                        pak. Now on Brat diet.. Providers:            Lupita Dawn. Candace Cruise, MD Referring MD:         Youlanda Roys. Lovie Macadamia, MD (Referring MD) Medicines:            Monitored Anesthesia Care Complications:        No immediate complications. Procedure:            Pre-Anesthesia Assessment:                       - Prior to the procedure, a History and Physical was                        performed, and patient medications, allergies and                        sensitivities were reviewed. The patient's tolerance of                        previous anesthesia was reviewed.                       - The risks and benefits of the procedure and the                        sedation options and risks were discussed with the                        patient. All questions were answered and informed                        consent was obtained.                       - After reviewing the risks and benefits, the patient                        was deemed in satisfactory condition to undergo the                        procedure.                       After obtaining informed consent, the colonoscope was                        passed under direct vision. Throughout the procedure,                        the patient's blood pressure, pulse, and oxygen  saturations were monitored continuously. The                        Colonoscope was introduced through the anus and                        advanced to the the cecum, identified by appendiceal                        orifice and ileocecal valve. The colonoscopy was                         performed without difficulty. The patient tolerated the                        procedure well. The quality of the bowel preparation                        was good. Findings:      Multiple small-mouthed diverticula were found in the sigmoid colon.       Biopsies for histology were taken with a cold forceps from the entire       colon for evaluation of microscopic colitis.      The exam was otherwise without abnormality. Impression:           - Diverticulosis in the sigmoid colon. Biopsied.                       - The examination was otherwise normal. Recommendation:       - Discharge patient to home.                       - Await pathology results.                       - The findings and recommendations were discussed with                        the patient. Procedure Code(s):    --- Professional ---                       [phone], Colonoscopy, flexible; with biopsy, single or                        multiple Diagnosis Code(s):    --- Professional ---                       R19.7, Diarrhea, unspecified                       K57.30, Diverticulosis of large intestine without                        perforation or abscess without bleeding CPT copyright 2016 American Medical Association. All rights reserved. The codes documented in this report are preliminary and upon coder review may  be revised to meet current compliance requirements. Hulen Luster, MD 03/14/2016 4:15:57 PM This report has been signed electronically. Number of Addenda: 0 Note Initiated On: 03/14/2016 3:58 PM Scope Withdrawal Time: 0 hours 4 minutes 37 seconds  Total Procedure Duration: 0 hours 10 minutes 6 seconds  [facility]

## 2016-03-14 NOTE — Anesthesia Preprocedure Evaluation (Signed)
Anesthesia Evaluation  Patient identified by MRN, date of birth, ID band Patient awake    Reviewed: Allergy & Precautions, H&P , NPO status , Patient's Chart, lab work & pertinent test results, reviewed documented beta blocker date and time   Airway Mallampati: II   Neck ROM: full    Dental  (+) Poor Dentition   Pulmonary neg pulmonary ROS, former smoker,    Pulmonary exam normal        Cardiovascular + Peripheral Vascular Disease  negative cardio ROS Normal cardiovascular exam     Neuro/Psych negative neurological ROS  negative psych ROS   GI/Hepatic negative GI ROS, Neg liver ROS, GERD  ,  Endo/Other  negative endocrine ROS  Renal/GU negative Renal ROS  negative genitourinary   Musculoskeletal   Abdominal   Peds  Hematology negative hematology ROS (+)   Anesthesia Other Findings Past Medical History:   Hypercholesteremia                                           Arthritis                                                    Osteoporosis                                                 GERD (gastroesophageal reflux disease)                       Cataract                                                     Sleep apnea                                                  Venous insufficiency                                       Past Surgical History:   ABDOMINAL HYSTERECTOMY                                        DILATION AND CURETTAGE OF UTERUS                              Reproductive/Obstetrics                             Anesthesia Physical Anesthesia Plan  ASA: III  Anesthesia Plan: General   Post-op Pain Management:    Induction:  Airway Management Planned:   Additional Equipment:   Intra-op Plan:   Post-operative Plan:   Informed Consent: I have reviewed the patients History and Physical, chart, labs and discussed the procedure including the risks, benefits and  alternatives for the proposed anesthesia with the patient or authorized representative who has indicated his/her understanding and acceptance.   Dental Advisory Given  Plan Discussed with: CRNA  Anesthesia Plan Comments:         Anesthesia Quick Evaluation

## 2016-03-14 NOTE — H&P (Signed)
  Date of Initial H&P: 03/07/2016  History reviewed, patient examined, no change in status, stable for surgery.

## 2016-03-18 LAB — SURGICAL PATHOLOGY

## 2016-03-19 ENCOUNTER — Encounter: Payer: Self-pay | Admitting: Gastroenterology

## 2016-08-08 ENCOUNTER — Other Ambulatory Visit: Payer: Self-pay | Admitting: Family Medicine

## 2016-08-08 DIAGNOSIS — Z1231 Encounter for screening mammogram for malignant neoplasm of breast: Secondary | ICD-10-CM

## 2016-08-16 DIAGNOSIS — M4850XD Collapsed vertebra, not elsewhere classified, site unspecified, subsequent encounter for fracture with routine healing: Secondary | ICD-10-CM | POA: Insufficient documentation

## 2016-08-27 ENCOUNTER — Other Ambulatory Visit: Payer: Self-pay | Admitting: Family Medicine

## 2016-08-27 ENCOUNTER — Ambulatory Visit
Admission: RE | Admit: 2016-08-27 | Discharge: 2016-08-27 | Disposition: A | Discharge status: Home / Self Care | Payer: Medicare Other | Source: Ambulatory Visit | Attending: Family Medicine | Admitting: Family Medicine

## 2016-08-27 DIAGNOSIS — Z1231 Encounter for screening mammogram for malignant neoplasm of breast: Secondary | ICD-10-CM | POA: Diagnosis present

## 2017-01-28 DIAGNOSIS — S299XXA Unspecified injury of thorax, initial encounter: Secondary | ICD-10-CM | POA: Diagnosis not present

## 2017-01-28 DIAGNOSIS — Z9181 History of falling: Secondary | ICD-10-CM | POA: Diagnosis not present

## 2017-01-28 DIAGNOSIS — R0781 Pleurodynia: Secondary | ICD-10-CM | POA: Diagnosis not present

## 2017-04-17 DIAGNOSIS — J301 Allergic rhinitis due to pollen: Secondary | ICD-10-CM | POA: Diagnosis not present

## 2017-04-17 DIAGNOSIS — R42 Dizziness and giddiness: Secondary | ICD-10-CM | POA: Diagnosis not present

## 2017-04-17 DIAGNOSIS — H6980 Other specified disorders of Eustachian tube, unspecified ear: Secondary | ICD-10-CM | POA: Diagnosis not present

## 2017-04-17 DIAGNOSIS — H903 Sensorineural hearing loss, bilateral: Secondary | ICD-10-CM | POA: Diagnosis not present

## 2017-04-17 DIAGNOSIS — H9319 Tinnitus, unspecified ear: Secondary | ICD-10-CM | POA: Diagnosis not present

## 2017-04-18 ENCOUNTER — Ambulatory Visit (INDEPENDENT_AMBULATORY_CARE_PROVIDER_SITE_OTHER): Payer: Medicare Other | Admitting: Obstetrics and Gynecology

## 2017-04-18 ENCOUNTER — Encounter: Payer: Self-pay | Admitting: Obstetrics and Gynecology

## 2017-04-18 VITALS — BP 141/80 | HR 92 | Ht 59.0 in | Wt 138.4 lb

## 2017-04-18 DIAGNOSIS — Z01419 Encounter for gynecological examination (general) (routine) without abnormal findings: Secondary | ICD-10-CM

## 2017-04-18 NOTE — Progress Notes (Signed)
Subjective:     Carol Stone is a 71 y.o. female G0 postmenopausal who is here for a comprehensive physical exam. The patient reports no problems. She is not currently sexually active. She underwent a hysterectomy for fibroids in the 1980's. She denies any pelvic pain or abnormal discharge. She is been followed by [facility] for HTN and osteoporosis. She exercises regularly and walks 3 miles daily  Past Medical History:  Diagnosis Date  . Arthritis   . Cataract   . GERD (gastroesophageal reflux disease)   . Hypercholesteremia   . Osteoporosis   . Sleep apnea   . Tinnitus   . Venous insufficiency    Past Surgical History:  Procedure Laterality Date  . ABDOMINAL HYSTERECTOMY    . COLONOSCOPY WITH PROPOFOL N/A 03/14/2016   Procedure: COLONOSCOPY WITH PROPOFOL;  Surgeon: Hulen Luster, MD;  [facility] ENDOSCOPY;  Service: Gastroenterology;  Laterality: N/A;  . DILATION AND CURETTAGE OF UTERUS     Family History  Problem Relation Age of Onset  . Prostate cancer Father   . Lung cancer Sister   . Breast cancer Maternal Aunt 52    Social History   Social History  . Marital status: Married    Spouse name: N/A  . Number of children: N/A  . Years of education: N/A   Occupational History  . Not on file.   Social History Main Topics  . Smoking status: Former Research scientist (life sciences)  . Smokeless tobacco: Never Used  . Alcohol use No  . Drug use: No  . Sexual activity: Not Currently    Birth control/ protection: Surgical   Other Topics Concern  . Not on file   Social History Narrative  . No narrative on file   Health Maintenance  Topic Date Due  . Hepatitis C Screening  1946-10-08  . TETANUS/TDAP  09/21/1964  . MAMMOGRAM  09/22/1995  . DEXA SCAN  09/21/2010  . PNA vac Low Risk Adult (1 of 2 - PCV13) 09/21/2010  . INFLUENZA VACCINE  07/09/2017  . COLONOSCOPY  03/14/2026       Review of Systems Pertinent items are noted in HPI.   Objective:  Blood pressure (!) 141/80, pulse 92, height  4\' 11"  (1.499 m), weight 138 lb 6.4 oz (62.8 kg).     GENERAL: Well-developed, well-nourished female in no acute distress.  HEENT: Normocephalic, atraumatic. Sclerae anicteric.  NECK: Supple. Normal thyroid.  LUNGS: Clear to auscultation bilaterally.  HEART: Regular rate and rhythm. BREASTS: Symmetric in size. No palpable masses or lymphadenopathy, skin changes, or nipple drainage. ABDOMEN: Soft, nontender, nondistended. No organomegaly. PELVIC: Normal external female genitalia. Vagina is atrophic.  Normal discharge. Vaginal vault is intact. No adnexal mass or tenderness. EXTREMITIES: No cyanosis, clubbing, or edema, 2+ distal pulses.    Assessment:    Healthy female exam.      Plan:    Pap smear not indicated Screening mammogram due in fall Follow up with PCP for routine care See After Visit Summary for Counseling Recommendations

## 2017-04-18 NOTE — Progress Notes (Signed)
Pt presents for Annual no concerns per pt.

## 2017-07-23 ENCOUNTER — Other Ambulatory Visit: Payer: Self-pay | Admitting: Family Medicine

## 2017-07-23 DIAGNOSIS — Z1231 Encounter for screening mammogram for malignant neoplasm of breast: Secondary | ICD-10-CM

## 2017-07-31 DIAGNOSIS — X32XXXA Exposure to sunlight, initial encounter: Secondary | ICD-10-CM | POA: Diagnosis not present

## 2017-07-31 DIAGNOSIS — D2262 Melanocytic nevi of left upper limb, including shoulder: Secondary | ICD-10-CM | POA: Diagnosis not present

## 2017-07-31 DIAGNOSIS — D225 Melanocytic nevi of trunk: Secondary | ICD-10-CM | POA: Diagnosis not present

## 2017-07-31 DIAGNOSIS — L570 Actinic keratosis: Secondary | ICD-10-CM | POA: Diagnosis not present

## 2017-07-31 DIAGNOSIS — L718 Other rosacea: Secondary | ICD-10-CM | POA: Diagnosis not present

## 2017-07-31 DIAGNOSIS — L821 Other seborrheic keratosis: Secondary | ICD-10-CM | POA: Diagnosis not present

## 2017-08-04 IMAGING — CR DG CHEST 2V
1 series · 2 of 2 positions shown · non-contrast
Comparison: None.

CLINICAL DATA: Dehydration and cough

EXAM:
CHEST  2 VIEW

[Series 1: dg chest 2 view · 0.14mm/px · 2 of 2 slices shown]
[im 1/2]
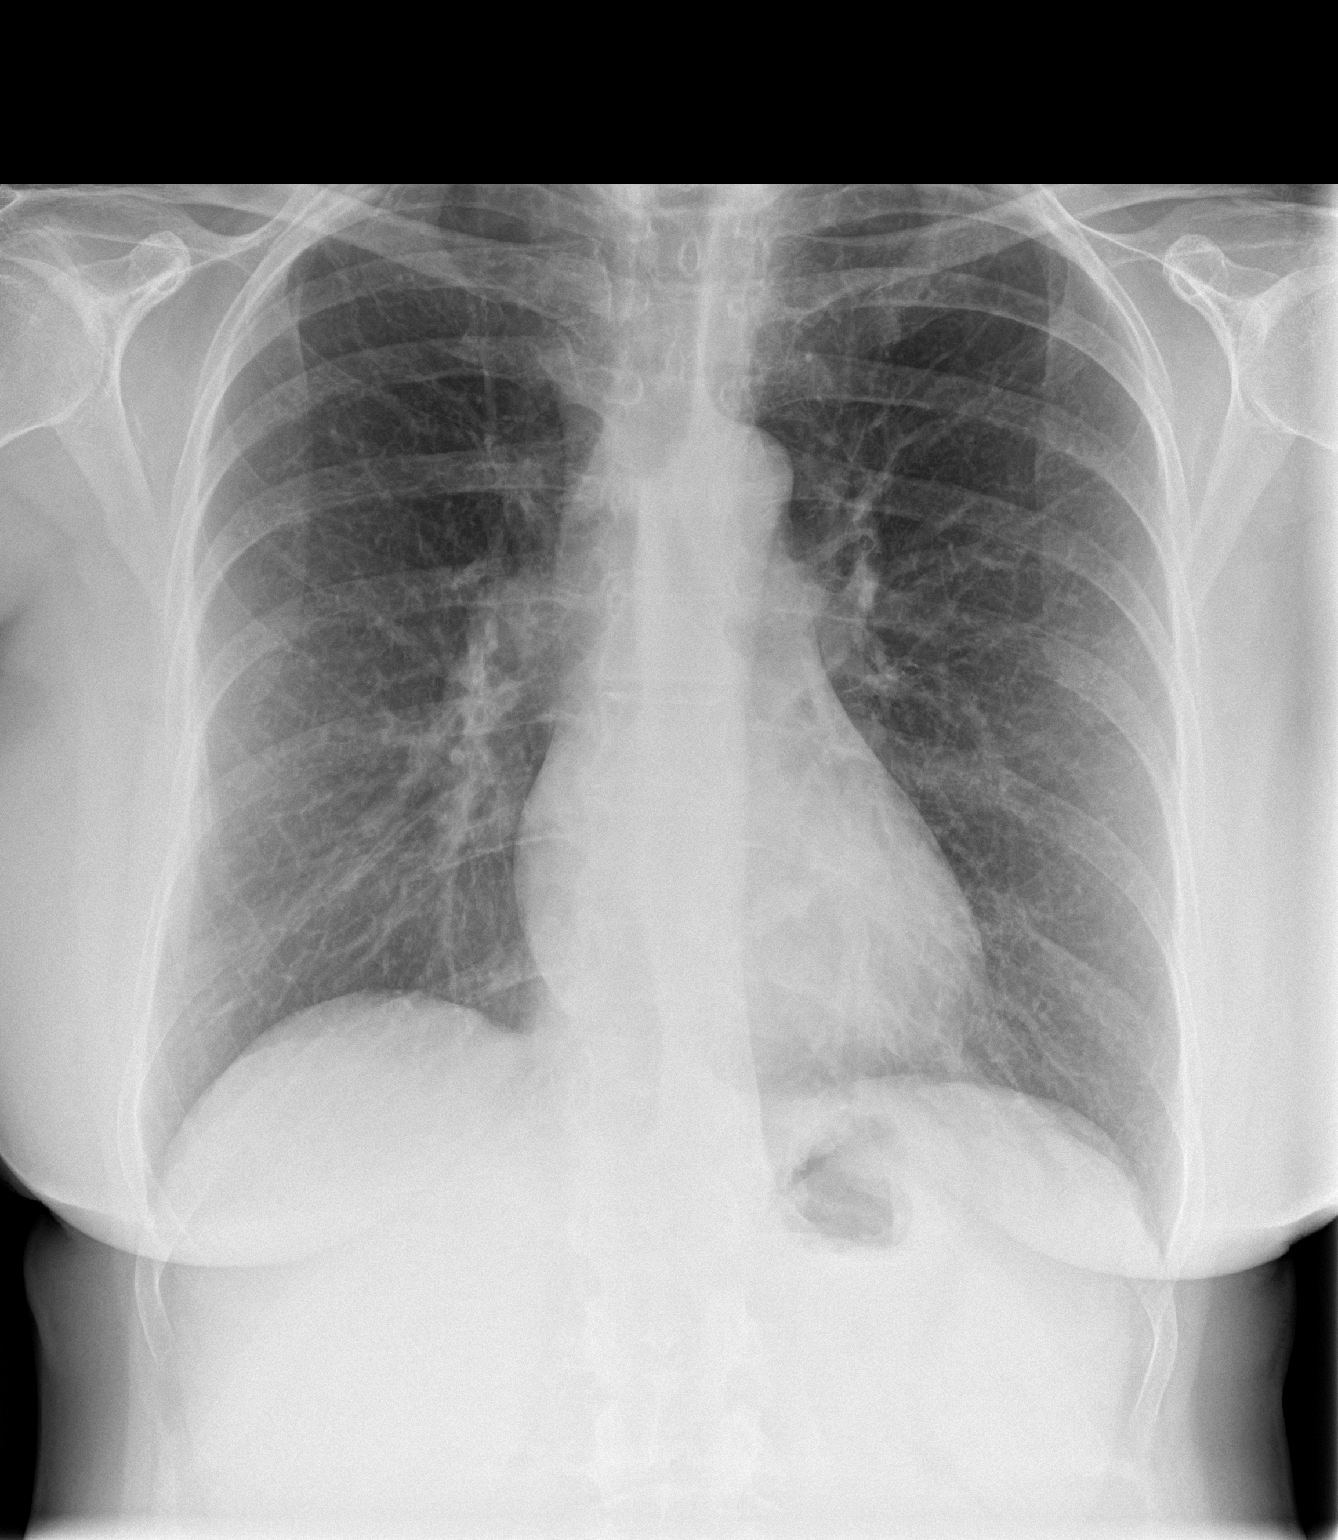
[im 2/2]
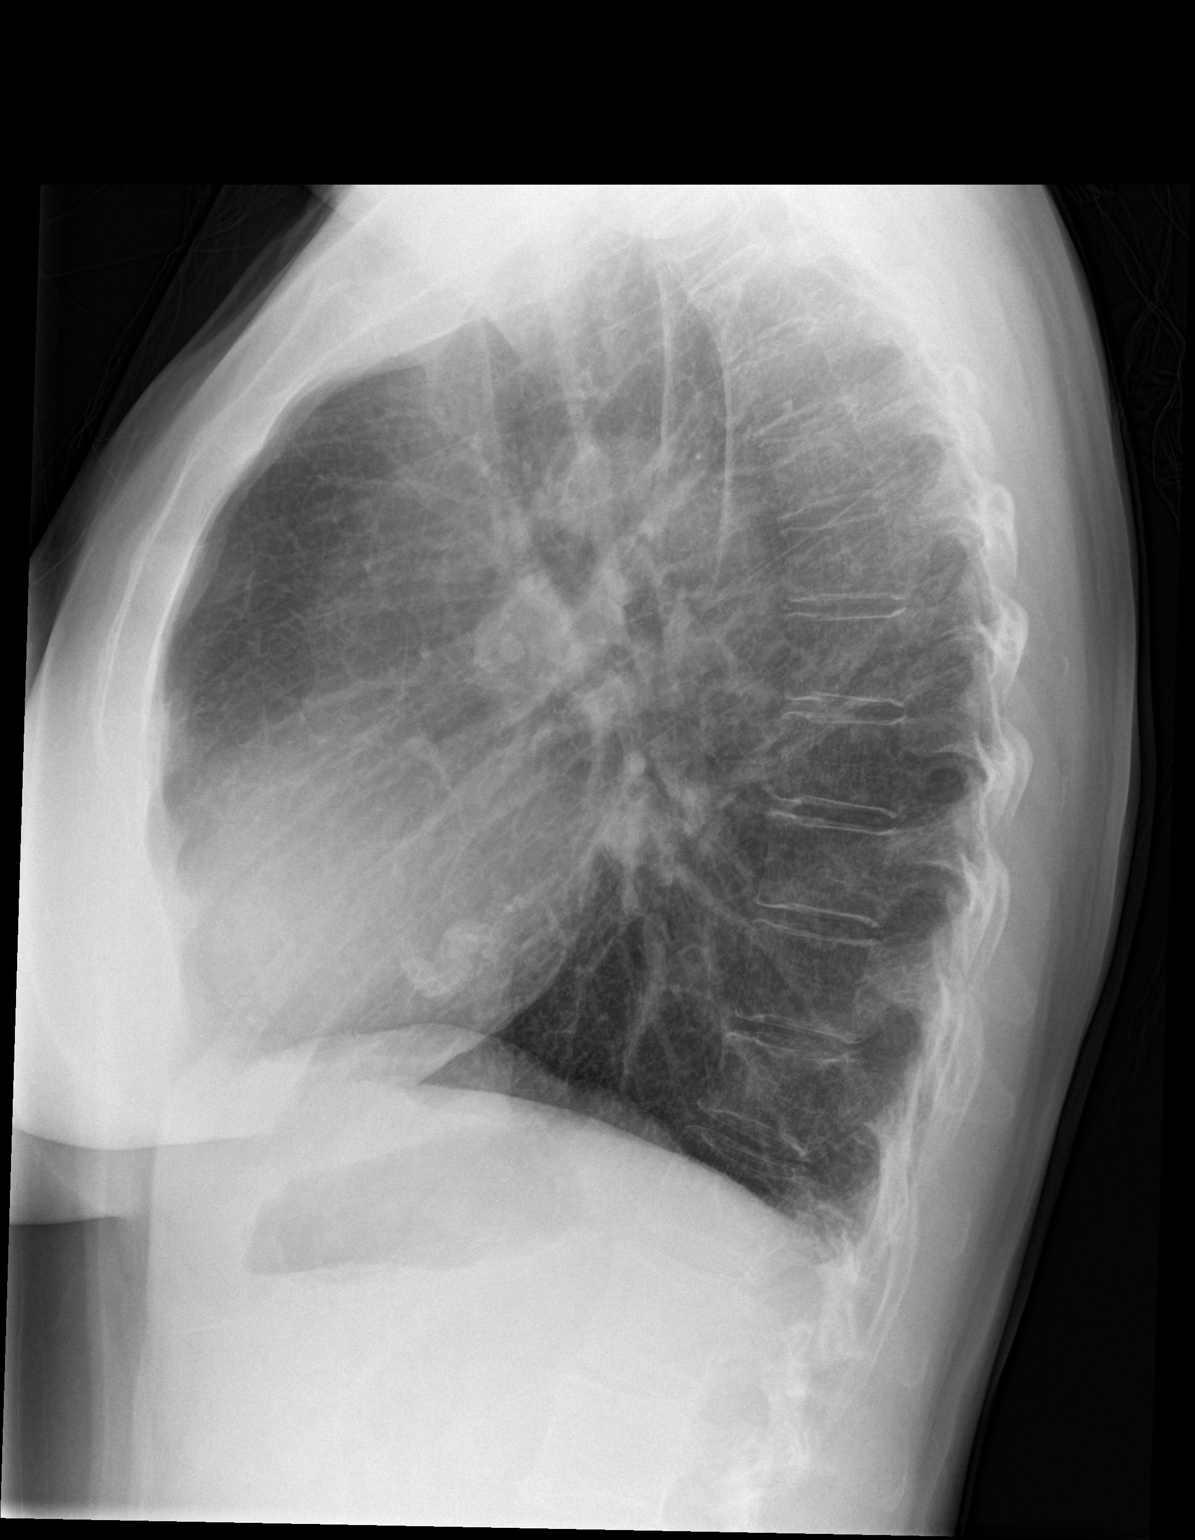

[2 of 2 positions shown; findings below may reference images not displayed]

FINDINGS: Cardiac shadow is within normal limits. The lungs are hyperinflated
consistent with COPD. No focal infiltrate or sizable effusion is
noted. Mitral calcifications are noted. No bony abnormality is seen.
IMPRESSION: COPD without acute abnormality.

## 2017-08-18 DIAGNOSIS — J019 Acute sinusitis, unspecified: Secondary | ICD-10-CM | POA: Diagnosis not present

## 2017-08-20 DIAGNOSIS — K5909 Other constipation: Secondary | ICD-10-CM | POA: Diagnosis not present

## 2017-08-20 DIAGNOSIS — E785 Hyperlipidemia, unspecified: Secondary | ICD-10-CM | POA: Diagnosis not present

## 2017-08-20 DIAGNOSIS — J069 Acute upper respiratory infection, unspecified: Secondary | ICD-10-CM | POA: Diagnosis not present

## 2017-08-20 DIAGNOSIS — M8000XD Age-related osteoporosis with current pathological fracture, unspecified site, subsequent encounter for fracture with routine healing: Secondary | ICD-10-CM | POA: Diagnosis not present

## 2017-08-20 DIAGNOSIS — Z0000 Encounter for general adult medical examination without abnormal findings: Secondary | ICD-10-CM | POA: Diagnosis not present

## 2017-09-03 ENCOUNTER — Ambulatory Visit
Admission: RE | Admit: 2017-09-03 | Discharge: 2017-09-03 | Disposition: A | Discharge status: Home / Self Care | Payer: Medicare Other | Source: Ambulatory Visit | Attending: Family Medicine | Admitting: Family Medicine

## 2017-09-03 DIAGNOSIS — Z1231 Encounter for screening mammogram for malignant neoplasm of breast: Secondary | ICD-10-CM | POA: Diagnosis not present

## 2017-09-15 DIAGNOSIS — K635 Polyp of colon: Secondary | ICD-10-CM | POA: Diagnosis not present

## 2017-09-15 DIAGNOSIS — K219 Gastro-esophageal reflux disease without esophagitis: Secondary | ICD-10-CM | POA: Diagnosis not present

## 2017-09-15 DIAGNOSIS — K5909 Other constipation: Secondary | ICD-10-CM | POA: Diagnosis not present

## 2017-10-02 DIAGNOSIS — Z23 Encounter for immunization: Secondary | ICD-10-CM | POA: Diagnosis not present

## 2017-10-03 DIAGNOSIS — H2511 Age-related nuclear cataract, right eye: Secondary | ICD-10-CM | POA: Diagnosis not present

## 2018-01-09 DIAGNOSIS — I788 Other diseases of capillaries: Secondary | ICD-10-CM | POA: Insufficient documentation

## 2018-02-23 ENCOUNTER — Encounter: Payer: Self-pay | Admitting: Radiology

## 2018-03-06 DIAGNOSIS — E785 Hyperlipidemia, unspecified: Secondary | ICD-10-CM | POA: Diagnosis not present

## 2018-03-06 DIAGNOSIS — G4730 Sleep apnea, unspecified: Secondary | ICD-10-CM | POA: Diagnosis not present

## 2018-03-10 DIAGNOSIS — K5909 Other constipation: Secondary | ICD-10-CM | POA: Diagnosis not present

## 2018-03-10 DIAGNOSIS — K219 Gastro-esophageal reflux disease without esophagitis: Secondary | ICD-10-CM | POA: Diagnosis not present

## 2018-03-10 DIAGNOSIS — G4730 Sleep apnea, unspecified: Secondary | ICD-10-CM | POA: Diagnosis not present

## 2018-03-15 DIAGNOSIS — G4733 Obstructive sleep apnea (adult) (pediatric): Secondary | ICD-10-CM | POA: Diagnosis not present

## 2018-04-22 ENCOUNTER — Encounter: Payer: Self-pay | Admitting: Family Medicine

## 2018-04-22 ENCOUNTER — Ambulatory Visit (INDEPENDENT_AMBULATORY_CARE_PROVIDER_SITE_OTHER): Payer: Medicare Other | Admitting: Family Medicine

## 2018-04-22 VITALS — BP 123/76 | HR 71 | Wt 138.2 lb

## 2018-04-22 DIAGNOSIS — Z0000 Encounter for general adult medical examination without abnormal findings: Secondary | ICD-10-CM

## 2018-04-22 DIAGNOSIS — Z01419 Encounter for gynecological examination (general) (routine) without abnormal findings: Secondary | ICD-10-CM

## 2018-04-22 NOTE — Progress Notes (Signed)
   GYNECOLOGY ANNUAL PREVENTATIVE CARE ENCOUNTER NOTE  Subjective:   Carol Stone is a 72 y.o. G0P0000 female here for a routine annual gynecologic exam.  Current complaints: none.   Denies abnormal vaginal bleeding, discharge, pelvic pain, problems with intercourse or other gynecologic concerns.    Gynecologic History No LMP recorded. Patient has had a hysterectomy. Contraception: none Last Pap: prior to hysterectomy Last mammogram: 08/2018- wnl  Health Maintenance Due  Topic Date Due  . Hepatitis C Screening  [birth year]  . TETANUS/TDAP  09/21/1964  . DEXA SCAN  09/21/2010  . PNA vac Low Risk Adult (1 of 2 - PCV13) 09/21/2010     The following portions of the patient's history were reviewed and updated as appropriate: allergies, current medications, past family history, past medical history, past social history, past surgical history and problem list.  Review of Systems Pertinent items are noted in HPI.   Objective:  BP 123/76   Pulse 71   Wt 138 lb 3.2 oz (62.7 kg)   BMI 27.91 kg/m  CONSTITUTIONAL: Well-developed, well-nourished female in no acute distress.  HENT:  Normocephalic, atraumatic, External right and left ear normal. Oropharynx is clear and moist EYES:  No scleral icterus.  NECK: Normal range of motion, supple, no masses.  Normal thyroid.  SKIN: Skin is warm and dry. No rash noted. Not diaphoretic. No erythema. No pallor. NEUROLOGIC: Alert and oriented to person, place, and time. Normal reflexes, muscle tone coordination. No cranial nerve deficit noted. PSYCHIATRIC: Normal mood and affect. Normal behavior. Normal judgment and thought content. CARDIOVASCULAR: Normal heart rate noted, regular rhythm. 2+ distal pulses. RESPIRATORY: Effort and breath sounds normal, no problems with respiration noted. BREASTS: Symmetric in size. No masses, skin changes, nipple drainage, or lymphadenopathy. ABDOMEN: Soft,  no distention noted.  No tenderness, rebound or guarding.    PELVIC: Normal appearing external genitalia; normal appearing vaginal mucosa. No abnormal discharge noted.  *surigcally absent uterus and left ovary. Right ovary palpable and not enlarged, no other palpable masses, no uterine or adnexal tenderness. MUSCULOSKELETAL: Normal range of motion.    Assessment and Plan:  1) Annual gynecologic examination  Pap not indicated  Mammogram wnl in Sept, next in 08/2019  Encouraged PCP visit- reviewed HCM  Reports having had PNA vaccination, shingles and Tdap.   >50% of this 25 minute visit was spent discussing prevenative care, women's health, warning s/sx to return to care  Please refer to After Visit Summary for other counseling recommendations.   Return in about 1 year (around 04/23/2019) for Yearly wellness exam.  Caren Macadam, MD, MPH, ABFM Attending [place] for [facility]

## 2018-04-22 NOTE — Patient Instructions (Signed)
Preventive Care 31 Years and Older, Female Preventive care refers to lifestyle choices and visits with your health care provider that can promote health and wellness. What does preventive care include?  A yearly physical exam. This is also called an annual well check.  Dental exams once or twice a year.  Routine eye exams. Ask your health care provider how often you should have your eyes checked.  Personal lifestyle choices, including: ? Daily care of your teeth and gums. ? Regular physical activity. ? Eating a healthy diet. ? Avoiding tobacco and drug use. ? Limiting alcohol use. ? Practicing safe sex. ? Taking low-dose aspirin every day. ? Taking vitamin and mineral supplements as recommended by your health care provider. What happens during an annual well check? The services and screenings done by your health care provider during your annual well check will depend on your age, overall health, lifestyle risk factors, and family history of disease. Counseling Your health care provider may ask you questions about your:  Alcohol use.  Tobacco use.  Drug use.  Emotional well-being.  Home and relationship well-being.  Sexual activity.  Eating habits.  History of falls.  Memory and ability to understand (cognition).  Work and work Statistician.  Reproductive health.  Screening You may have the following tests or measurements:  Height, weight, and BMI.  Blood pressure.  Lipid and cholesterol levels. These may be checked every 5 years, or more frequently if you are over 73 years old.  Skin check.  Lung cancer screening. You may have this screening every year starting at age 73 if you have a 30-pack-year history of smoking and currently smoke or have quit within the past 15 years.  Fecal occult blood test (FOBT) of the stool. You may have this test every year starting at age 73.  Flexible sigmoidoscopy or colonoscopy. You may have a sigmoidoscopy every 5 years or  a colonoscopy every 10 years starting at age 73.  Hepatitis C blood test.  Hepatitis B blood test.  Sexually transmitted disease (STD) testing.  Diabetes screening. This is done by checking your blood sugar (glucose) after you have not eaten for a while (fasting). You may have this done every 1-3 years.  Bone density scan. This is done to screen for osteoporosis. You may have this done starting at age 73.  Mammogram. This may be done every 1-2 years. Talk to your health care provider about how often you should have regular mammograms.  Talk with your health care provider about your test results, treatment options, and if necessary, the need for more tests. Vaccines Your health care provider may recommend certain vaccines, such as:  Influenza vaccine. This is recommended every year.  Tetanus, diphtheria, and acellular pertussis (Tdap, Td) vaccine. You may need a Td booster every 10 years.  Varicella vaccine. You may need this if you have not been vaccinated.  Zoster vaccine. You may need this after age 73.  Measles, mumps, and rubella (MMR) vaccine. You may need at least one dose of MMR if you were born in 1957 or later. You may also need a second dose.  Pneumococcal 13-valent conjugate (PCV13) vaccine. One dose is recommended after age 73.  Pneumococcal polysaccharide (PPSV23) vaccine. One dose is recommended after age 73.  Meningococcal vaccine. You may need this if you have certain conditions.  Hepatitis A vaccine. You may need this if you have certain conditions or if you travel or work in places where you may be exposed to hepatitis  A.  Hepatitis B vaccine. You may need this if you have certain conditions or if you travel or work in places where you may be exposed to hepatitis B.  Haemophilus influenzae type b (Hib) vaccine. You may need this if you have certain conditions.  Talk to your health care provider about which screenings and vaccines you need and how often you  need them. This information is not intended to replace advice given to you by your health care provider. Make sure you discuss any questions you have with your health care provider. Document Released: 12/22/2015 Document Revised: 08/14/2016 Document Reviewed: 09/26/2015 Elsevier Interactive Patient Education  [employer].

## 2018-04-22 NOTE — Progress Notes (Signed)
Mammogram 09/03/2017 normal

## 2018-06-04 DIAGNOSIS — R197 Diarrhea, unspecified: Secondary | ICD-10-CM | POA: Diagnosis not present

## 2018-06-04 DIAGNOSIS — K219 Gastro-esophageal reflux disease without esophagitis: Secondary | ICD-10-CM | POA: Diagnosis not present

## 2018-06-04 DIAGNOSIS — K5909 Other constipation: Secondary | ICD-10-CM | POA: Diagnosis not present

## 2018-06-05 ENCOUNTER — Other Ambulatory Visit
Admission: RE | Admit: 2018-06-05 | Discharge: 2018-06-05 | Disposition: A | Discharge status: Home / Self Care | Payer: Medicare Other | Source: Ambulatory Visit | Attending: Internal Medicine | Admitting: Internal Medicine

## 2018-06-05 DIAGNOSIS — R197 Diarrhea, unspecified: Secondary | ICD-10-CM | POA: Diagnosis not present

## 2018-06-05 LAB — C DIFFICILE QUICK SCREEN W PCR REFLEX
C Diff antigen: NEGATIVE
C Diff interpretation: NOT DETECTED
C Diff toxin: NEGATIVE

## 2018-06-05 LAB — GASTROINTESTINAL PANEL BY PCR, STOOL (REPLACES STOOL CULTURE)

## 2018-06-05 LAB — LACTOFERRIN, FECAL, QUALITATIVE: LACTOFERRIN, FECAL, QUAL: NEGATIVE

## 2018-06-08 DIAGNOSIS — H9319 Tinnitus, unspecified ear: Secondary | ICD-10-CM | POA: Insufficient documentation

## 2018-07-02 DIAGNOSIS — M79674 Pain in right toe(s): Secondary | ICD-10-CM | POA: Diagnosis not present

## 2018-07-02 DIAGNOSIS — M79675 Pain in left toe(s): Secondary | ICD-10-CM | POA: Diagnosis not present

## 2018-07-02 DIAGNOSIS — B351 Tinea unguium: Secondary | ICD-10-CM | POA: Diagnosis not present

## 2018-07-22 ENCOUNTER — Other Ambulatory Visit: Payer: Self-pay | Admitting: Family Medicine

## 2018-07-22 DIAGNOSIS — Z1231 Encounter for screening mammogram for malignant neoplasm of breast: Secondary | ICD-10-CM

## 2018-08-06 DIAGNOSIS — D2272 Melanocytic nevi of left lower limb, including hip: Secondary | ICD-10-CM | POA: Diagnosis not present

## 2018-08-06 DIAGNOSIS — D2261 Melanocytic nevi of right upper limb, including shoulder: Secondary | ICD-10-CM | POA: Diagnosis not present

## 2018-08-06 DIAGNOSIS — X32XXXA Exposure to sunlight, initial encounter: Secondary | ICD-10-CM | POA: Diagnosis not present

## 2018-08-06 DIAGNOSIS — D2271 Melanocytic nevi of right lower limb, including hip: Secondary | ICD-10-CM | POA: Diagnosis not present

## 2018-08-06 DIAGNOSIS — L570 Actinic keratosis: Secondary | ICD-10-CM | POA: Diagnosis not present

## 2018-08-06 DIAGNOSIS — I788 Other diseases of capillaries: Secondary | ICD-10-CM | POA: Diagnosis not present

## 2018-08-06 DIAGNOSIS — D225 Melanocytic nevi of trunk: Secondary | ICD-10-CM | POA: Diagnosis not present

## 2018-08-06 DIAGNOSIS — D2262 Melanocytic nevi of left upper limb, including shoulder: Secondary | ICD-10-CM | POA: Diagnosis not present

## 2018-08-24 DIAGNOSIS — K219 Gastro-esophageal reflux disease without esophagitis: Secondary | ICD-10-CM | POA: Diagnosis not present

## 2018-08-24 DIAGNOSIS — Z0000 Encounter for general adult medical examination without abnormal findings: Secondary | ICD-10-CM | POA: Diagnosis not present

## 2018-08-24 DIAGNOSIS — E785 Hyperlipidemia, unspecified: Secondary | ICD-10-CM | POA: Diagnosis not present

## 2018-08-24 DIAGNOSIS — M8000XD Age-related osteoporosis with current pathological fracture, unspecified site, subsequent encounter for fracture with routine healing: Secondary | ICD-10-CM | POA: Diagnosis not present

## 2018-08-25 DIAGNOSIS — K219 Gastro-esophageal reflux disease without esophagitis: Secondary | ICD-10-CM | POA: Diagnosis not present

## 2018-08-25 DIAGNOSIS — E785 Hyperlipidemia, unspecified: Secondary | ICD-10-CM | POA: Diagnosis not present

## 2018-08-25 DIAGNOSIS — M8000XD Age-related osteoporosis with current pathological fracture, unspecified site, subsequent encounter for fracture with routine healing: Secondary | ICD-10-CM | POA: Diagnosis not present

## 2018-09-04 ENCOUNTER — Ambulatory Visit
Admission: RE | Admit: 2018-09-04 | Discharge: 2018-09-04 | Disposition: A | Discharge status: Home / Self Care | Payer: Medicare Other | Source: Ambulatory Visit | Attending: Family Medicine | Admitting: Family Medicine

## 2018-09-04 DIAGNOSIS — Z1231 Encounter for screening mammogram for malignant neoplasm of breast: Secondary | ICD-10-CM | POA: Insufficient documentation

## 2018-09-24 DIAGNOSIS — Z23 Encounter for immunization: Secondary | ICD-10-CM | POA: Diagnosis not present

## 2018-11-20 ENCOUNTER — Ambulatory Visit (INDEPENDENT_AMBULATORY_CARE_PROVIDER_SITE_OTHER): Payer: Medicare Other | Admitting: Internal Medicine

## 2018-11-20 VITALS — BP 144/80 | HR 88 | Temp 98.4°F | Ht 59.0 in | Wt 138.2 lb

## 2018-11-20 DIAGNOSIS — Z1329 Encounter for screening for other suspected endocrine disorder: Secondary | ICD-10-CM

## 2018-11-20 DIAGNOSIS — E785 Hyperlipidemia, unspecified: Secondary | ICD-10-CM

## 2018-11-20 DIAGNOSIS — I10 Essential (primary) hypertension: Secondary | ICD-10-CM

## 2018-11-20 DIAGNOSIS — R030 Elevated blood-pressure reading, without diagnosis of hypertension: Secondary | ICD-10-CM | POA: Diagnosis not present

## 2018-11-20 DIAGNOSIS — G4700 Insomnia, unspecified: Secondary | ICD-10-CM | POA: Diagnosis not present

## 2018-11-20 DIAGNOSIS — Z1159 Encounter for screening for other viral diseases: Secondary | ICD-10-CM

## 2018-11-20 DIAGNOSIS — E559 Vitamin D deficiency, unspecified: Secondary | ICD-10-CM | POA: Diagnosis not present

## 2018-11-20 DIAGNOSIS — Z13818 Encounter for screening for other digestive system disorders: Secondary | ICD-10-CM | POA: Diagnosis not present

## 2018-11-20 DIAGNOSIS — Z0184 Encounter for antibody response examination: Secondary | ICD-10-CM | POA: Diagnosis not present

## 2018-11-20 DIAGNOSIS — Z1389 Encounter for screening for other disorder: Secondary | ICD-10-CM | POA: Diagnosis not present

## 2018-11-20 DIAGNOSIS — M810 Age-related osteoporosis without current pathological fracture: Secondary | ICD-10-CM | POA: Diagnosis not present

## 2018-11-20 DIAGNOSIS — G4733 Obstructive sleep apnea (adult) (pediatric): Secondary | ICD-10-CM

## 2018-11-20 DIAGNOSIS — R21 Rash and other nonspecific skin eruption: Secondary | ICD-10-CM

## 2018-11-20 DIAGNOSIS — Z9989 Dependence on other enabling machines and devices: Secondary | ICD-10-CM

## 2018-11-20 NOTE — Patient Instructions (Addendum)
Calcium 600 mg 1-2 x per day  Vitamin D3 1000-2000 IU daily    Pigmented Purpuric Eruption, Stasis Dermatitis, or Vasculitis -dermatology Dr. Kellie Moor to confirm  Consider getting circulation checked with vascular in the future    Stasis Dermatitis Stasis dermatitis is a long-term (chronic) skin condition that happens when veins can no longer pump blood back to the heart (poor circulation). This condition causes a red or brown scaly rash or sores (ulcers) from the pooling of blood (stasis). This condition usually affects the lower legs. It may affect one leg or both legs. Without treatment, severe stasis dermatitis can lead to other skin conditions and infections. What are the causes? This condition is caused by poor circulation. What increases the risk? This condition is more likely to develop in people who:  Are not very active.  Stand for long periods of time.  Have veins that have become enlarged and twisted (varicose veins).  Have leg veins that are not strong enough to send blood back to the heart (venous insufficiency).  Have had a blood clot.  Have been pregnant many times.  Have had vein surgery.  Are obese.  Have heart or kidney failure.  Are 41 years of age or older.  What are the signs or symptoms? Common early symptoms of this condition include:  Swelling in your ankle or leg. This might get better overnight but be worse again in the day.  Skin that looks thin on your ankle and leg.  Owens Shark marks that develop slowly.  Skin that is easily irritated or cracked.  Red, swollen skin.  An achy or heavy feeling after you walk or stand for long periods of time.  Pain.  Later and more severe symptoms of this condition include:  Skin that looks shiny.  Small, open sores (ulcers). These are often red or purple.  Dry, cracking skin.  Skin that feels hard.  Severe itching.  A change in the shape or color of your lower legs.  Severe  pain.  Difficulty walking.  How is this diagnosed? Your health care provider may suspect this condition from your symptoms and medical history. Your health care provider will also do a physical exam. You may need to see a health care provider who specializes in skin diseases (dermatologist). You may also have tests to confirm the diagnosis, including:  Blood tests.  Imaging studies to check blood flow (Doppler ultrasound).  Allergy tests.  How is this treated? Treatment for this condition may include medicine, such as:  Corticosteroid creams and ointments.  Non-corticosteroid medicines applied to the skin (topical).  Medicine to reduce swelling in the legs (diuretics).  Antibiotics.  Medicine to relieve itching (antihistamines).  You may also have to wear:  Compression stockings or an elastic wrap to improve circulation.  A bandage (dressing).  A wrap that contains zinc and gelatin (Unna boot).  Follow these instructions at home: Castine your skin as told by your health care provider. Do not use moisturizers with fragrance. This can irritate your skin.  Apply cool compresses to the affected areas.  Do not scratch your skin.  Do not rub your skin dry after a bath or shower. Gently pat your skin dry.  Do not use scented soaps, detergents, or perfumes. Medicines  Take or use over-the-counter and prescription medicines only as told by your health care provider.  If you were prescribed an antibiotic medicine, take or use it as told by your health care provider. Do not  stop taking or using the antibiotic even if your condition starts to improve. Lifestyle  Do not stand or sit in one position for long periods of time.  Do not cross your legs when you sit.  Raise (elevate) your legs above the level of your heart when you are sitting or lying down.  Walk as told by your health care provider. Walking increases blood flow.  Wear comfortable,  loose-fitting clothing. Circulation in your legs will be worse if you wear tight pants, belts, and waistbands. General instructions  Change and remove any dressing as told by your health care provider, if this applies.  Wear compression stockings as told by your health care provider, if this applies. These stockings help to prevent blood clots and reduce swelling in your legs.  Wear the [employer] as told by your health care provider, if this applies.  Keep all follow-up visits as told by your health care provider. This is important. Contact a health care provider if:  Your condition does not improve with treatment.  Your condition gets worse.  You have signs of infection in the affected area. Watch for: ? Swelling. ? Tenderness. ? Redness. ? Soreness. ? Warmth.  You have a fever. Get help right away if:  You notice red streaks coming from the affected area.  Your bone or joint underneath the affected area becomes painful after the skin has healed.  The affected area turns darker.  You feel a deep pain in your leg or groin.  You are short of breath. This information is not intended to replace advice given to you by your health care provider. Make sure you discuss any questions you have with your health care provider. Document Released: 03/06/2006 Document Revised: 07/23/2016 Document Reviewed: 04/12/2015 Elsevier Interactive Patient Education  2018 [employer].   Osteoporosis Osteoporosis is the thinning and loss of density in the bones. Osteoporosis makes the bones more brittle, fragile, and likely to break (fracture). Over time, osteoporosis can cause the bones to become so weak that they fracture after a simple fall. The bones most likely to fracture are the bones in the hip, wrist, and spine. What are the causes? The exact cause is not known. What increases the risk? Anyone can develop osteoporosis. You may be at greater risk if you have a family history of the  condition or have poor nutrition. You may also have a higher risk if you are:  Female.  70 years old or older.  A smoker.  Not physically active.  White or Asian.  Slender.  What are the signs or symptoms? A fracture might be the first sign of the disease, especially if it results from a fall or injury that would not usually cause a bone to break. Other signs and symptoms include:  Low back and neck pain.  Stooped posture.  Height loss.  How is this diagnosed? To make a diagnosis, your health care provider may:  Take a medical history.  Perform a physical exam.  Order tests, such as: ? A bone mineral density test. ? A dual-energy X-ray absorptiometry test.  How is this treated? The goal of osteoporosis treatment is to strengthen your bones to reduce your risk of a fracture. Treatment may involve:  Making lifestyle changes, such as: ? Eating a diet rich in calcium. ? Doing weight-bearing and muscle-strengthening exercises. ? Stopping tobacco use. ? Limiting alcohol intake.  Taking medicine to slow the process of bone loss or to increase bone density.  Monitoring  your levels of calcium and vitamin D.  Follow these instructions at home:  Include calcium and vitamin D in your diet. Calcium is important for bone health, and vitamin D helps the body absorb calcium.  Perform weight-bearing and muscle-strengthening exercises as directed by your health care provider.  Do not use any tobacco products, including cigarettes, chewing tobacco, and electronic cigarettes. If you need help quitting, ask your health care provider.  Limit your alcohol intake.  Take medicines only as directed by your health care provider.  Keep all follow-up visits as directed by your health care provider. This is important.  Take precautions at home to lower your risk of falling, such as: ? Keeping rooms well lit and clutter free. ? Installing safety rails on stairs. ? Using rubber mats  in the bathroom and other areas that are often wet or slippery. Get help right away if: You fall or injure yourself. This information is not intended to replace advice given to you by your health care provider. Make sure you discuss any questions you have with your health care provider. Document Released: 09/04/2005 Document Revised: 04/29/2016 Document Reviewed: 05/05/2014 Elsevier Interactive Patient Education  [employer].

## 2018-11-20 NOTE — Progress Notes (Signed)
shingrix is not at [facility].

## 2018-11-20 NOTE — Progress Notes (Signed)
Chief Complaint  Patient presents with  . Establish Care   New patient former PCP Dr. Lovie Macadamia  1. OSA on cpap but machine is >73 years old and outdated she reports she is getting 6 hrs of sleep but interrupted and needs new supplies and cpap will refer to Lung MD discussed today  2. Disc rash to lower legs more red a times w/o sx's I.e itching or pain. She will discuss with Dr. Kellie Moor when it flares again  3. HLD on pravastatin 40 mg qhs reviewed lipid panel 08/2018 former PCP see A&P 4. Osteoporosis she was on oral bisphosphonates x 10 + years but had jaw necrosis denies any further meds to tx   Review of Systems  Constitutional: Negative for weight loss.  HENT: Negative for hearing loss.   Eyes: Negative for blurred vision.  Respiratory: Negative for shortness of breath.        +OSA needs new cpap    Cardiovascular: Negative for chest pain.  Gastrointestinal: Negative for abdominal pain.  Musculoskeletal: Negative for falls.  Skin: Negative for rash.       Rash to legs    Neurological: Negative for headaches.  Endo/Heme/Allergies: Positive for environmental allergies.  Psychiatric/Behavioral: Negative for depression and memory loss.   Past Medical History:  Diagnosis Date  . Allergy   . Arthritis   . Cataract   . Frequent headaches    seasonal  . GERD (gastroesophageal reflux disease)   . History of chicken pox   . History of colon polyps   . Hypercholesteremia   . IBS (irritable bowel syndrome)    constipation   . OSA on CPAP   . Osteoporosis   . Sleep apnea   . Tinnitus   . Venous insufficiency    Past Surgical History:  Procedure Laterality Date  . ABDOMINAL HYSTERECTOMY    . COLONOSCOPY WITH PROPOFOL N/A 03/14/2016   Procedure: COLONOSCOPY WITH PROPOFOL;  Surgeon: Hulen Luster, MD;  Location: [facility] ENDOSCOPY;  Service: Gastroenterology;  Laterality: N/A;  . DILATION AND CURETTAGE OF UTERUS    . TONSILLECTOMY     Family History  Problem Relation Age of Onset   . Prostate cancer Father   . Cancer Father        prostate   . Hypertension Father   . Lung cancer Sister   . Cancer Sister        lung smoker   . Deep vein thrombosis Sister   . Pulmonary embolism Sister   . Breast cancer Maternal Aunt 52  . Cancer Maternal Aunt        breast  . Stroke Mother   . Osteoporosis Mother   . Heart disease Maternal Grandmother        CHF  . AAA (abdominal aortic aneurysm) Paternal Grandmother   . Hypertension Paternal Grandmother   . Cancer Paternal Grandfather        prostate   Social History   Socioeconomic History  . Marital status: Married    Spouse name: Not on file  . Number of children: Not on file  . Years of education: Not on file  . Highest education level: Not on file  Occupational History  . Not on file  Social Needs  . Financial resource strain: Not on file  . Food insecurity:    Worry: Not on file    Inability: Not on file  . Transportation needs:    Medical: Not on file    Non-medical: Not on  file  Tobacco Use  . Smoking status: Former Research scientist (life sciences)  . Smokeless tobacco: Never Used  Substance and Sexual Activity  . Alcohol use: No  . Drug use: No  . Sexual activity: Not Currently    Birth control/protection: Surgical  Lifestyle  . Physical activity:    Days per week: Not on file    Minutes per session: Not on file  . Stress: Not on file  Relationships  . Social connections:    Talks on phone: Not on file    Gets together: Not on file    Attends religious service: Not on file    Active member of club or organization: Not on file    Attends meetings of clubs or organizations: Not on file    Relationship status: Not on file  . Intimate partner violence:    Fear of current or ex partner: Not on file    Emotionally abused: Not on file    Physically abused: Not on file    Forced sexual activity: Not on file  Other Topics Concern  . Not on file  Social History Narrative   Married, husband in house    Wears seat belt,  husband owns guns, safe in relationship    No kids    Masters, previous nurse office work    No kids    Retired    Current Meds  Medication Sig  . calcium-vitamin D ([place] D) 500-200 MG-UNIT tablet Take 1 tablet by mouth.  . diclofenac sodium (VOLTAREN) 1 % GEL Apply topically 4 (four) times daily. Prn  . Multiple Vitamin (MULTIVITAMIN) tablet Take 1 tablet by mouth daily.  . polyethylene glycol powder (GLYCOLAX/MIRALAX) powder Take by mouth.  . pravastatin (PRAVACHOL) 40 MG tablet Take 40 mg by mouth daily.  . RESTASIS 0.05 % ophthalmic emulsion PLACE 1 DROP INTO BOTH EYES TWICE A DAY   Allergies  Allergen Reactions  . Bactrim [Sulfamethoxazole-Trimethoprim] Hives  . Codeine Other (See Comments)  . Compazine [Prochlorperazine Edisylate] Other (See Comments)  . Demerol [Meperidine] Other (See Comments)  . Indocin [Indomethacin] Other (See Comments)  . Other     Onions and garlic    . Reglan [Metoclopramide] Other (See Comments)   No results found for this or any previous visit (from the past 2160 hour(s)). Objective  Body mass index is 27.91 kg/m. Wt Readings from Last 3 Encounters:  11/20/18 138 lb 3.2 oz (62.7 kg)  04/22/18 138 lb 3.2 oz (62.7 kg)  04/18/17 138 lb 6.4 oz (62.8 kg)   Temp Readings from Last 3 Encounters:  11/20/18 98.4 F (36.9 C) (Oral)  03/14/16 (!) 96.8 F (36 C) (Tympanic)  03/03/16 97.5 F (36.4 C) (Oral)   BP Readings from Last 3 Encounters:  11/20/18 (!) 144/80  04/22/18 123/76  04/18/17 (!) 141/80   Pulse Readings from Last 3 Encounters:  11/20/18 88  04/22/18 71  04/18/17 92    Physical Exam Vitals signs and nursing note reviewed.  Constitutional:      Appearance: Normal appearance.     Comments: Knitting in the room today   HENT:     Head: Normocephalic and atraumatic.     Mouth/Throat:     Mouth: Mucous membranes are moist.     Pharynx: Oropharynx is clear.  Eyes:     Conjunctiva/sclera: Conjunctivae normal.      Pupils: Pupils are equal, round, and reactive to light.  Cardiovascular:     Rate and Rhythm: Normal rate and regular  rhythm.     Pulses: Normal pulses.          Dorsalis pedis pulses are 2+ on the right side and 2+ on the left side.       Posterior tibial pulses are 2+ on the right side and 2+ on the left side.     Heart sounds: Normal heart sounds.  Pulmonary:     Effort: Pulmonary effort is normal.     Breath sounds: Normal breath sounds.  Skin:    General: Skin is warm and dry.     Comments: Mild erythema b/l legs    Neurological:     General: No focal deficit present.     Mental Status: She is alert and oriented to person, place, and time.     Gait: Gait normal.  Psychiatric:        Attention and Perception: Attention and perception normal.        Mood and Affect: Mood and affect normal.        Speech: Speech normal.        Behavior: Behavior normal. Behavior is cooperative.        Thought Content: Thought content normal.        Cognition and Memory: Cognition and memory normal.        Judgment: Judgment normal.     Assessment   1.elevated blood pressure today per pt white coat HTN  2. HLD 3. OSA on cpap  4. Insomnia sleeping 6 hours interrupted sleep  5. Rash to legs ddx PPE, stasis dermatitis less likely vasculitis  6. HM Plan   1. Monitor BP  2. Check fasting labs 02/2019  3. Refer to pulm needs new sleep study and machine/equipment  4. Assess after repeat sleep study consider otc melatonin  5.  Disc with dermatology could consider [facility] steroid cream  If not better and disc derm consider vascular consult  6.  Flu shot utd  prevnar utd, consider pna 23 vaccine  Tdap had 08/16/16 Had zostervax  shingrix vaccine per pt had 2/2 no record at [facility] of age window for pap s/p hysterectomy 1984 for fibroids 1 ovary intact  Mammogram neg 09/04/18 neg  DEXA 09/03/16 +osteoporosis was on oral bisphonates and had jaw necrosis declines other meds for now I.e prolia  discussed today. rec calcium 600 mg 1-2 x per day and D3 1000-2000 IU daily  Colonoscopy h/o polyps last [place] Dr. Candace Cruise diverticulosis 03/14/16 bxs negative  Dermatology Dr. Kellie Moor Aks right cheek LN2 saw 2019 f/u in 1 year  Former smoker age [age] light smoker   Reviewed labs 08/25/18 CMET, CBC normal TC 207, TG 167, HDL 60.3, LDL 113, TSH had 1.122 03/31/16, UA normal, vitamin D 79.2 11/07/16   Eye Dr. Merla Riches saw 11/27/18  Dentist Dr. Kendell Bane  GI Dr. Alice Reichert    Provider: Dr. Olivia Mackie McLean-Scocuzza-Internal Medicine    Fransisco Beau CMA Pre visit review using our clinic review tool, if applicable. No additional management support is needed unless otherwise documented below in the visit note.

## 2018-11-23 ENCOUNTER — Encounter: Payer: Self-pay | Admitting: Internal Medicine

## 2018-11-23 DIAGNOSIS — G4733 Obstructive sleep apnea (adult) (pediatric): Secondary | ICD-10-CM | POA: Insufficient documentation

## 2018-11-23 DIAGNOSIS — Z9989 Dependence on other enabling machines and devices: Principal | ICD-10-CM

## 2018-11-23 DIAGNOSIS — G4700 Insomnia, unspecified: Secondary | ICD-10-CM

## 2018-11-23 DIAGNOSIS — R030 Elevated blood-pressure reading, without diagnosis of hypertension: Secondary | ICD-10-CM

## 2018-11-23 DIAGNOSIS — E785 Hyperlipidemia, unspecified: Secondary | ICD-10-CM | POA: Insufficient documentation

## 2018-11-23 DIAGNOSIS — M810 Age-related osteoporosis without current pathological fracture: Secondary | ICD-10-CM | POA: Insufficient documentation

## 2018-11-23 HISTORY — DX: Insomnia, unspecified: G47.00

## 2018-11-23 HISTORY — DX: Elevated blood-pressure reading, without diagnosis of hypertension: R03.0

## 2018-11-27 DIAGNOSIS — H2513 Age-related nuclear cataract, bilateral: Secondary | ICD-10-CM | POA: Diagnosis not present

## 2018-12-04 ENCOUNTER — Encounter: Payer: Self-pay | Admitting: Internal Medicine

## 2018-12-17 ENCOUNTER — Ambulatory Visit (INDEPENDENT_AMBULATORY_CARE_PROVIDER_SITE_OTHER): Payer: Medicare Other | Admitting: Internal Medicine

## 2018-12-17 ENCOUNTER — Encounter: Payer: Self-pay | Admitting: Internal Medicine

## 2018-12-17 VITALS — BP 138/76 | HR 104 | Ht 61.0 in | Wt 140.0 lb

## 2018-12-17 DIAGNOSIS — G4733 Obstructive sleep apnea (adult) (pediatric): Secondary | ICD-10-CM

## 2018-12-17 NOTE — Progress Notes (Signed)
Name: Carol Stone MRN: [id number] DOB: 12/04/1946     CONSULTATION DATE: 1.9.20 REFERRING MD : [place]: I have sleep apnea   HISTORY OF PRESENT ILLNESS: 73 yo pleasant white female seen today for assessment for sleep apnea Dx with Sleep apnea 15 years ago Doing real well with nasal CPAP  She has been having increased daytime sleepiness She thinks settings have changed She does NOT know her current settings  No signs of infection No signs of CHF  No lower ext swelling  Discussed sleep data and reviewed with patient.  Encouraged proper weight management.  Discussed driving precautions and its relationship with hypersomnolence.  Discussed operating dangerous equipment and its relationship with hypersomnolence.  Discussed sleep hygiene, and benefits of a fixed sleep waked time.  The importance of getting eight or more hours of sleep discussed with patient.  Discussed limiting the use of the computer and television before bedtime.  Decrease naps during the day, so night time sleep will become enhanced.  Limit caffeine, and sleep deprivation.  HTN, stroke, and heart failure are potential risk factors.    She is nonsmoker Non ETOH Retired Therapist, sports    PAST MEDICAL HISTORY :   has a past medical history of Allergy, Arthritis, Cataract, Frequent headaches, GERD (gastroesophageal reflux disease), History of chicken pox, History of colon polyps, Hypercholesteremia, IBS (irritable bowel syndrome), OSA on CPAP, Osteoporosis, Sleep apnea, Tinnitus, and Venous insufficiency.  has a past surgical history that includes Abdominal hysterectomy; Dilation and curettage of uterus; Colonoscopy with propofol (N/A, 03/14/2016); and Tonsillectomy. Prior to Admission medications   Medication Sig Start Date End Date Taking? Authorizing Provider  calcium-vitamin D (OSCAL WITH D) 500-200 MG-UNIT tablet Take 1 tablet by mouth.   Yes [provider]  diclofenac sodium  (VOLTAREN) 1 % GEL Apply topically 4 (four) times daily. Prn   Yes [provider]  Multiple Vitamin (MULTIVITAMIN) tablet Take 1 tablet by mouth daily.   Yes [provider]  polyethylene glycol powder (GLYCOLAX/MIRALAX) powder Take by mouth. 09/23/17  Yes [provider]  pravastatin (PRAVACHOL) 40 MG tablet Take 40 mg by mouth daily.   Yes [provider]  RESTASIS 0.05 % ophthalmic emulsion PLACE 1 DROP INTO BOTH EYES TWICE A DAY 03/15/17  Yes [provider]   Allergies  Allergen Reactions  . Bactrim [Sulfamethoxazole-Trimethoprim] Hives  . Codeine Other (See Comments)  . Compazine [Prochlorperazine Edisylate] Other (See Comments)  . Demerol [Meperidine] Other (See Comments)  . Indocin [Indomethacin] Other (See Comments)  . Other     Onions and garlic    . Reglan [Metoclopramide] Other (See Comments)    FAMILY HISTORY:  family history includes AAA (abdominal aortic aneurysm) in her paternal grandmother; Breast cancer (age of onset: 63) in her maternal aunt; Cancer in her father, maternal aunt, paternal grandfather, and sister; Deep vein thrombosis in her sister; Heart disease in her maternal grandmother; Hypertension in her father and paternal grandmother; Lung cancer in her sister; Osteoporosis in her mother; Prostate cancer in her father; Pulmonary embolism in her sister; Stroke in her mother. SOCIAL HISTORY:  reports that she has quit smoking. She has never used smokeless tobacco. She reports that she does not drink alcohol or use drugs.  REVIEW OF SYSTEMS:   Constitutional: Negative for fever, chills, weight loss, + malaise/fatigue and diaphoresis.  HENT: Negative for hearing loss, ear pain, nosebleeds, congestion, sore throat, neck pain, tinnitus and ear discharge.   Eyes: Negative for  blurred vision, double vision, photophobia, pain, discharge and redness.  Respiratory: Negative for cough, hemoptysis, sputum production, shortness of  breath, wheezing and stridor.   Cardiovascular: Negative for chest pain, palpitations, orthopnea, claudication, leg swelling and PND.  Gastrointestinal: Negative for heartburn, nausea, vomiting, abdominal pain, diarrhea, constipation, blood in stool and melena.  Genitourinary: Negative for dysuria, urgency, frequency, hematuria and flank pain.  Musculoskeletal: Negative for myalgias, back pain, joint pain and falls.  Skin: Negative for itching and rash.  Neurological: Negative for dizziness, tingling, tremors, sensory change, speech change, focal weakness, seizures, loss of consciousness, weakness and headaches.  Endo/Heme/Allergies: Negative for environmental allergies and polydipsia. Does not bruise/bleed easily.  ALL OTHER ROS ARE NEGATIVE    BP 138/76 (BP Location: Left Arm, Cuff Size: Normal)   Pulse (!) 104   Ht 5\' 1"  (1.549 m)   Wt 140 lb (63.5 kg)   SpO2 99%   BMI 26.45 kg/m    Physical Examination:   GENERAL:NAD, no fevers, chills, no weakness no fatigue HEAD: Normocephalic, atraumatic.  EYES: Pupils equal, round, reactive to light. Extraocular muscles intact. No scleral icterus.  MOUTH: Moist mucosal membrane.   EAR, NOSE, THROAT: Clear without exudates. No external lesions.  NECK: Supple. No thyromegaly. No nodules. No JVD.  PULMONARY:CTA B/L no wheezes, no crackles, no rhonchi CARDIOVASCULAR: S1 and S2. Regular rate and rhythm. No murmurs, rubs, or gallops. No edema.  GASTROINTESTINAL: Soft, nontender, nondistended. No masses. Positive bowel sounds.  MUSCULOSKELETAL: No swelling, clubbing, or edema. Range of motion full in all extremities.  NEUROLOGIC: Cranial nerves II through XII are intact. No gross focal neurological deficits.  SKIN: No ulceration, lesions, rashes, or cyanosis. Skin warm and dry. Turgor intact.  PSYCHIATRIC: Mood, affect within normal limits. The patient is awake, alert and oriented x 3. Insight, judgment intact.      ASSESSMENT /  PLAN: OSA-for many years, increased daytime sleepiness Needs repeat Split night sleep study  Obesity -recommend significant weight loss -recommend changing diet  Deconditioned state -Recommend increased daily activity and exercise    Patient satisfied with Plan of action and management. All questions answered Follow up after sleep study completed   Corrin Parker, M.D.  [facility]  Medical Director [place] Director [facility] Department

## 2018-12-17 NOTE — Patient Instructions (Signed)
Obtain Spit Night sleep study  CONTINUE CPAP AS PRESCRIBED

## 2018-12-23 DIAGNOSIS — I788 Other diseases of capillaries: Secondary | ICD-10-CM | POA: Diagnosis not present

## 2019-01-06 ENCOUNTER — Ambulatory Visit: Payer: Medicare Other | Attending: Internal Medicine

## 2019-01-06 DIAGNOSIS — G4733 Obstructive sleep apnea (adult) (pediatric): Secondary | ICD-10-CM | POA: Insufficient documentation

## 2019-01-18 ENCOUNTER — Telehealth: Payer: Self-pay

## 2019-01-18 NOTE — Telephone Encounter (Signed)
Spoke to patient regarding sleep study results.   Obstructive sleep apnea NOT seen on this study. AHI was less than 5 during both REM and supine sleep.   Recommend evaluation of other conditions which could cause sleepiness such as depression, hypothyroidism, medications.  Education on sleep hygiene.   She questions whether she needs to keep using CPAP as she has been wearing it for years.   Will send to [facility] for clarification.

## 2019-01-19 NOTE — Telephone Encounter (Signed)
Pt is aware of above message and voiced her understanding. Nothing further is needed.

## 2019-01-19 NOTE — Telephone Encounter (Signed)
No need for CPAP

## 2019-02-11 DIAGNOSIS — H903 Sensorineural hearing loss, bilateral: Secondary | ICD-10-CM | POA: Diagnosis not present

## 2019-02-16 ENCOUNTER — Encounter: Payer: Self-pay | Admitting: Radiology

## 2019-02-19 ENCOUNTER — Other Ambulatory Visit: Payer: Self-pay

## 2019-02-19 ENCOUNTER — Other Ambulatory Visit (INDEPENDENT_AMBULATORY_CARE_PROVIDER_SITE_OTHER): Payer: Medicare Other

## 2019-02-19 DIAGNOSIS — I10 Essential (primary) hypertension: Secondary | ICD-10-CM

## 2019-02-19 DIAGNOSIS — Z13818 Encounter for screening for other digestive system disorders: Secondary | ICD-10-CM

## 2019-02-19 DIAGNOSIS — Z1329 Encounter for screening for other suspected endocrine disorder: Secondary | ICD-10-CM

## 2019-02-19 DIAGNOSIS — E559 Vitamin D deficiency, unspecified: Secondary | ICD-10-CM | POA: Diagnosis not present

## 2019-02-19 DIAGNOSIS — G4733 Obstructive sleep apnea (adult) (pediatric): Secondary | ICD-10-CM

## 2019-02-19 DIAGNOSIS — Z0184 Encounter for antibody response examination: Secondary | ICD-10-CM

## 2019-02-19 DIAGNOSIS — Z1389 Encounter for screening for other disorder: Secondary | ICD-10-CM

## 2019-02-19 DIAGNOSIS — R030 Elevated blood-pressure reading, without diagnosis of hypertension: Secondary | ICD-10-CM

## 2019-02-19 DIAGNOSIS — E785 Hyperlipidemia, unspecified: Secondary | ICD-10-CM

## 2019-02-19 LAB — CBC WITH DIFFERENTIAL/PLATELET
BASOS ABS: 0 10*3/uL (ref 0.0–0.1)
Basophils Relative: 0.6 % (ref 0.0–3.0)
EOS PCT: 2.4 % (ref 0.0–5.0)
Eosinophils Absolute: 0.1 10*3/uL (ref 0.0–0.7)
HEMATOCRIT: 40.7 % (ref 36.0–46.0)
Hemoglobin: 13.9 g/dL (ref 12.0–15.0)
LYMPHS ABS: 1.9 10*3/uL (ref 0.7–4.0)
LYMPHS PCT: 38.8 % (ref 12.0–46.0)
MCHC: 34.2 g/dL (ref 30.0–36.0)
MCV: 92.3 fl (ref 78.0–100.0)
MONOS PCT: 7.3 % (ref 3.0–12.0)
Monocytes Absolute: 0.4 10*3/uL (ref 0.1–1.0)
Neutro Abs: 2.5 10*3/uL (ref 1.4–7.7)
Neutrophils Relative %: 50.9 % (ref 43.0–77.0)
Platelets: 207 10*3/uL (ref 150.0–400.0)
RBC: 4.41 Mil/uL (ref 3.87–5.11)
RDW: 13.1 % (ref 11.5–15.5)
WBC: 4.9 10*3/uL (ref 4.0–10.5)

## 2019-02-19 LAB — LIPID PANEL
CHOLESTEROL: 200 mg/dL (ref 0–200)
HDL: 69.9 mg/dL (ref >39.00)
LDL Cholesterol: 106 mg/dL — ABNORMAL HIGH (ref 0–99)
NONHDL: 129.88
Total CHOL/HDL Ratio: 3
Triglycerides: 120 mg/dL (ref 0.0–149.0)
VLDL: 24 mg/dL (ref 0.0–40.0)

## 2019-02-19 LAB — TSH: TSH: 1.71 u[IU]/mL (ref 0.35–4.50)

## 2019-02-19 LAB — COMPREHENSIVE METABOLIC PANEL
ALBUMIN: 4.4 g/dL (ref 3.5–5.2)
ALK PHOS: 64 U/L (ref 39–117)
ALT: 17 U/L (ref 0–35)
AST: 18 U/L (ref 0–37)
BILIRUBIN TOTAL: 0.7 mg/dL (ref 0.2–1.2)
BUN: 21 mg/dL (ref 6–23)
CALCIUM: 9.3 mg/dL (ref 8.4–10.5)
CO2: 26 mEq/L (ref 19–32)
Chloride: 105 mEq/L (ref 96–112)
Creatinine, Ser: 0.79 mg/dL (ref 0.40–1.20)
GFR: 71.26 mL/min (ref >60.00)
Glucose, Bld: 92 mg/dL (ref 70–99)
POTASSIUM: 4.4 meq/L (ref 3.5–5.1)
Sodium: 140 mEq/L (ref 135–145)
TOTAL PROTEIN: 6.8 g/dL (ref 6.0–8.3)

## 2019-02-19 LAB — URINALYSIS, ROUTINE W REFLEX MICROSCOPIC
BILIRUBIN URINE: NEGATIVE
GLUCOSE, UA: NEGATIVE
Hgb urine dipstick: NEGATIVE
Ketones, ur: NEGATIVE
LEUKOCYTE UA: NEGATIVE
Nitrite: NEGATIVE
PROTEIN: NEGATIVE
SPECIFIC GRAVITY, URINE: 1.019 (ref 1.001–1.03)
pH: 6.5 (ref 5.0–8.0)

## 2019-02-19 LAB — VITAMIN D 25 HYDROXY (VIT D DEFICIENCY, FRACTURES): VITD: 53.42 ng/mL (ref 30.00–100.00)

## 2019-02-22 LAB — HEPATITIS C ANTIBODY
HEP C AB: NONREACTIVE
SIGNAL TO CUT-OFF: 0 (ref <1.00)

## 2019-02-22 LAB — MEASLES/MUMPS/RUBELLA IMMUNITY
Mumps IgG: >300 AU/mL
Rubeola IgG: >300 AU/mL

## 2019-03-17 ENCOUNTER — Other Ambulatory Visit: Payer: Self-pay

## 2019-03-17 ENCOUNTER — Ambulatory Visit (INDEPENDENT_AMBULATORY_CARE_PROVIDER_SITE_OTHER): Payer: Medicare Other | Admitting: Internal Medicine

## 2019-03-17 ENCOUNTER — Encounter: Payer: Self-pay | Admitting: Internal Medicine

## 2019-03-17 DIAGNOSIS — R0683 Snoring: Secondary | ICD-10-CM

## 2019-03-17 NOTE — Patient Instructions (Signed)
Patient will continue to use CPAP as prescribed

## 2019-03-17 NOTE — Progress Notes (Signed)
Name: Carol Stone MRN: [id number] DOB: Nov 27, 1946     CONSULTATION DATE: 1.9.20 REFERRING MD : [place] 24 minutes  TELEPHONE VISIT    In the setting of the current Covid19 crisis, you are scheduled for a  visit with me on 03/17/2019  Just as we do with many in-office visits, in order for you to participate in this visit, we must obtain consent.   I can obtain your verbal consent now.  PATIENT AGREES AND CONFIRMS -YES This Visit has Audio and Visual Capabilities for optimal patient care experience   Evaluation Performed:  Follow-up visit  This visit type was conducted due to national recommendations for restrictions regarding the COVID-19 Pandemic (e.g. social distancing).  This format is felt to be most appropriate for this patient at this time.  All issues noted in this document were discussed and addressed.     Virtual Visit via Telephone Note   I connected with patient on 03/17/2019  by telephone and verified that I am speaking with the correct person using two identifiers.   I discussed the limitations, risks, security and privacy concerns of performing an evaluation and management service by telephone and the availability of in person appointments. I also discussed with the patient that there may be a patient responsible charge related to this service. The patient expressed understanding and agreed to proceed.   Location of the patient: Home Location of provider: Home Participating persons: Patient and provider only      CHIEF COMPLAINT: follow up sleep study   HISTORY OF PRESENT ILLNESS: Patient still with excessive snoring that is causing insomnia Her sleep study performed in the last month does not reveal any evidence of sleep apnea There was excessive snoring during that time  Patient still feels like she needs to wear her CPAP machine She is happy that she would like to continue wearing her CPAP machine and she feels better  No  signs of infection at this time No evidence of shortness of breath or dyspnea No evidence of CHF     73 yo pleasant white female seen today for assessment for sleep apnea Dx with Sleep apnea 15 years ago Doing real well with nasal CPAP  She has been having increased daytime sleepiness She thinks settings have changed She does NOT know her current settings  No signs of infection No signs of CHF  Denies lower ext swelling    She is nonsmoker Non ETOH Retired Therapist, sports    PAST MEDICAL HISTORY :   has a past medical history of Allergy, Arthritis, Cataract, Frequent headaches, GERD (gastroesophageal reflux disease), History of chicken pox, History of colon polyps, Hypercholesteremia, IBS (irritable bowel syndrome), OSA on CPAP, Osteoporosis, Sleep apnea, Tinnitus, and Venous insufficiency.  has a past surgical history that includes Abdominal hysterectomy; Dilation and curettage of uterus; Colonoscopy with propofol (N/A, 03/14/2016); and Tonsillectomy. Prior to Admission medications   Medication Sig Start Date End Date Taking? Authorizing Provider  calcium-vitamin D (OSCAL WITH D) 500-200 MG-UNIT tablet Take 1 tablet by mouth.   Yes [provider]  diclofenac sodium (VOLTAREN) 1 % GEL Apply topically 4 (four) times daily. Prn   Yes [provider]  Multiple Vitamin (MULTIVITAMIN) tablet Take 1 tablet by mouth daily.   Yes [provider]  polyethylene glycol powder (GLYCOLAX/MIRALAX) powder Take by mouth. 09/23/17  Yes [provider]  pravastatin (PRAVACHOL) 40 MG tablet Take 40 mg by mouth daily.   Yes  [provider]  RESTASIS 0.05 % ophthalmic emulsion PLACE 1 DROP INTO BOTH EYES TWICE A DAY 03/15/17  Yes [provider]   Allergies  Allergen Reactions  . Bactrim [Sulfamethoxazole-Trimethoprim] Hives  . Codeine Other (See Comments)  . Compazine [Prochlorperazine Edisylate] Other (See Comments)  . Demerol [Meperidine] Other (See  Comments)  . Indocin [Indomethacin] Other (See Comments)  . Other     Onions and garlic    . Reglan [Metoclopramide] Other (See Comments)    FAMILY HISTORY:  family history includes AAA (abdominal aortic aneurysm) in her paternal grandmother; Breast cancer (age of onset: 79) in her maternal aunt; Cancer in her father, maternal aunt, paternal grandfather, and sister; Deep vein thrombosis in her sister; Heart disease in her maternal grandmother; Hypertension in her father and paternal grandmother; Lung cancer in her sister; Osteoporosis in her mother; Prostate cancer in her father; Pulmonary embolism in her sister; Stroke in her mother. SOCIAL HISTORY:  reports that she has quit smoking. She has never used smokeless tobacco. She reports that she does not drink alcohol or use drugs.   Review of Systems:  Gen:  Denies  fever, sweats, chills weigh loss  HEENT: Denies blurred vision, double vision, ear pain, eye pain, hearing loss, nose bleeds, sore throat Cardiac:  No dizziness, chest pain or heaviness, chest tightness,edema, No JVD Resp:   No cough, -sputum production, -shortness of breath,-wheezing, -hemoptysis,  Gi: Denies swallowing difficulty, stomach pain, nausea or vomiting, diarrhea, constipation, bowel incontinence Gu:  Denies bladder incontinence, burning urine Ext:   Denies Joint pain, stiffness or swelling Skin: Denies  skin rash, easy bruising or bleeding or hives Endoc:  Denies polyuria, polydipsia , polyphagia or weight change Psych:   Denies depression, insomnia or hallucinations  Other:  All other systems negative      ASSESSMENT / PLAN: Patient with respiratory effort related arousals from snoring  No significant evidence of obstructive sleep apnea  Patient continues to use CPAP therapy as previously prescribed  She is happy with this regimen and she will continue to use her CPAP as prescribed    Patient advised to call us with any further questions comments or  concerns    Obesity -recommend significant weight loss -recommend changing diet  Deconditioned state -Recommend increased daily activity and exercise      COVID-19 Education: The signs and symptoms of COVID-19 were discussed with the patient and how to seek care for testing (follow up with PCP or arrange E-visit).  The importance of social distancing was discussed today.   Patient satisfied with Plan of action and management. All questions answered  Corrin Parker, M.D.  [facility]  Medical Director [facility] Director [facility] Cardio-Pulmonary Department

## 2019-03-23 ENCOUNTER — Telehealth: Payer: Self-pay

## 2019-03-23 ENCOUNTER — Ambulatory Visit (INDEPENDENT_AMBULATORY_CARE_PROVIDER_SITE_OTHER): Payer: Medicare Other | Admitting: Internal Medicine

## 2019-03-23 ENCOUNTER — Encounter: Payer: Self-pay | Admitting: Internal Medicine

## 2019-03-23 DIAGNOSIS — I788 Other diseases of capillaries: Secondary | ICD-10-CM | POA: Diagnosis not present

## 2019-03-23 DIAGNOSIS — R21 Rash and other nonspecific skin eruption: Secondary | ICD-10-CM | POA: Diagnosis not present

## 2019-03-23 DIAGNOSIS — E785 Hyperlipidemia, unspecified: Secondary | ICD-10-CM

## 2019-03-23 DIAGNOSIS — M2550 Pain in unspecified joint: Secondary | ICD-10-CM

## 2019-03-23 DIAGNOSIS — Z1231 Encounter for screening mammogram for malignant neoplasm of breast: Secondary | ICD-10-CM

## 2019-03-23 HISTORY — DX: Other diseases of capillaries: I78.8

## 2019-03-23 MED ORDER — PRAVASTATIN SODIUM 40 MG PO TABS: 40.0000 mg | ORAL_TABLET | Freq: Every day | ORAL | 3 refills | Status: DC

## 2019-03-23 MED ORDER — TRIAMCINOLONE ACETONIDE 0.1 % EX CREA: 1.0000 "application " | TOPICAL_CREAM | Freq: Two times a day (BID) | CUTANEOUS | 0 refills | Status: DC

## 2019-03-23 NOTE — Telephone Encounter (Signed)
Copied from [place] [id number]. Topic: General - Other >> Mar 22, 2019  4:35 PM Mcneil, Ja-Kwan wrote: Reason for CRM: Pt stated she was told that she would receive an e-mail regarding the appt but she did not received anything. Pt requests call back. Cb# [phone]

## 2019-03-23 NOTE — Progress Notes (Signed)
Virtual Visit via Video Note [place]  I connected with Carol Stone   on 03/23/19 at 11:05 AM EDT by a video enabled telemedicine application and verified that I am speaking with the correct person using two identifiers.  Location patient: home Location provider:work  Persons participating in the virtual visit: patient, provider  I discussed the limitations of evaluation and management by telemedicine and the availability of in person appointments. The patient expressed understanding and agreed to proceed.   HPI: 1. H/o elevated BP not been checking her BP last was 138/76  2. HLD LDL 106 02/2019 needs refill of pravachol  3. Rash to legs and aching at night saw derm who stated this could be capillaritis. Achy legs worse at night tries Tylenol and helps her go back to sleep no burning/tingling in legs. She has compression stockings but not wearing  4. OSA cpap pending with doctor in [place] has not been able to coordinate with COVID 19   ROS: See pertinent positives and negatives per HPI.  Past Medical History:  Diagnosis Date  . Allergy   . Arthritis   . Cataract   . Frequent headaches    seasonal  . GERD (gastroesophageal reflux disease)   . History of chicken pox   . History of colon polyps   . Hypercholesteremia   . IBS (irritable bowel syndrome)    constipation   . OSA on CPAP   . Osteoporosis   . Sleep apnea   . Tinnitus   . Venous insufficiency     Past Surgical History:  Procedure Laterality Date  . ABDOMINAL HYSTERECTOMY    . COLONOSCOPY WITH PROPOFOL N/A 03/14/2016   Procedure: COLONOSCOPY WITH PROPOFOL;  Surgeon: Hulen Luster, MD;  Location: [facility] ENDOSCOPY;  Service: Gastroenterology;  Laterality: N/A;  . DILATION AND CURETTAGE OF UTERUS    . TONSILLECTOMY      Family History  Problem Relation Age of Onset  . Prostate cancer Father   . Cancer Father        prostate   . Hypertension Father   . Lung cancer Sister   . Cancer Sister        lung smoker   .  Deep vein thrombosis Sister   . Pulmonary embolism Sister   . Breast cancer Maternal Aunt 52  . Cancer Maternal Aunt        breast  . Stroke Mother   . Osteoporosis Mother   . Heart disease Maternal Grandmother        CHF  . AAA (abdominal aortic aneurysm) Paternal Grandmother   . Hypertension Paternal Grandmother   . Cancer Paternal Grandfather        prostate    SOCIAL HX: married lives [place]   Current Outpatient Medications:  .  calcium-vitamin D (OSCAL WITH D) 500-200 MG-UNIT tablet, Take 1 tablet by mouth., Disp: , Rfl:  .  diclofenac sodium (VOLTAREN) 1 % GEL, Apply topically 4 (four) times daily. Prn, Disp: , Rfl:  .  Multiple Vitamin (MULTIVITAMIN) tablet, Take 1 tablet by mouth daily., Disp: , Rfl:  .  polyethylene glycol powder (GLYCOLAX/MIRALAX) powder, Take by mouth., Disp: , Rfl:  .  pravastatin (PRAVACHOL) 40 MG tablet, Take 1 tablet (40 mg total) by mouth at bedtime., Disp: 90 tablet, Rfl: 3 .  RESTASIS 0.05 % ophthalmic emulsion, PLACE 1 DROP INTO BOTH EYES TWICE A DAY, Disp: , Rfl: 2 .  triamcinolone cream (KENALOG) 0.1 %, Apply 1 application topically 2 (  two) times daily. Prn legs b/l, Disp: 454 g, Rfl: 0  EXAM:  VITALS per patient if applicable:  GENERAL: alert, oriented, appears well and in no acute distress  HEENT: atraumatic, conjunttiva clear, no obvious abnormalities on inspection of external nose and ears  NECK: normal movements of the head and neck  LUNGS: on inspection no signs of respiratory distress, breathing rate appears normal, no obvious gross SOB, gasping or wheezing  CV: no obvious cyanosis  MS: moves all visible extremities without noticeable abnormality  PSYCH/NEURO: pleasant and cooperative, no obvious depression or anxiety, speech and thought processing grossly intact  ASSESSMENT AND PLAN:  Discussed the following assessment and plan:  Hyperlipidemia, unspecified hyperlipidemia type - Plan: pravastatin (PRAVACHOL) 40 MG  tablet  Capillaritis - Plan: triamcinolone cream (KENALOG) 0.1 %, compression stockings if continues to get worse will w/u with labs I.e esr, crp, ana,  In the future  Consider derm again to bx vs vascular to w/u for PVD  HM Flu shot utd  prevnar utd, consider pna 23 vaccine  Tdap had 08/16/16 Had zostervax  shingrix vaccine per pt had 2/2   Out of age window for pap s/p hysterectomy 1984 for fibroids 1 ovary intact  Mammogram neg 09/04/18 neg  -referred mammogram today  DEXA 09/03/16 +osteoporosis was on oral bisphonates and had jaw necrosis declines other meds for now I.e prolia discussed prev. rec calcium 600 mg 1-2 x per day and D3 1000-2000 IU daily  Colonoscopy h/o polyps last [facility] Dr. Candace Cruise diverticulosis 03/14/16 bxs negative will see if needed in 2022 h/o polyps pt does not know type   Dermatology Dr. Kellie Moor Aks right cheek LN2 saw 2019 f/u in 1 year   Former smoker age [age] light smoker   Eye Dr. Merla Riches saw 11/27/18  Dentist Dr. Kendell Bane  GI Dr. Alice Reichert     I discussed the assessment and treatment plan with the patient. The patient was provided an opportunity to ask questions and all were answered. The patient agreed with the plan and demonstrated an understanding of the instructions.   The patient was advised to call back or seek an in-person evaluation if the symptoms worsen or if the condition fails to improve as anticipated.  Time spent 15 minutes  Delorise Jackson, MD

## 2019-03-24 ENCOUNTER — Encounter: Payer: Self-pay | Admitting: Internal Medicine

## 2019-03-24 ENCOUNTER — Other Ambulatory Visit: Payer: Self-pay | Admitting: Internal Medicine

## 2019-03-24 DIAGNOSIS — E785 Hyperlipidemia, unspecified: Secondary | ICD-10-CM

## 2019-03-24 MED ORDER — PRAVASTATIN SODIUM 80 MG PO TABS: 80.0000 mg | ORAL_TABLET | Freq: Every day | ORAL | 12 refills | Status: DC

## 2019-03-30 ENCOUNTER — Encounter: Payer: Self-pay | Admitting: Internal Medicine

## 2019-05-14 ENCOUNTER — Encounter: Payer: Self-pay | Admitting: Internal Medicine

## 2019-05-18 NOTE — Addendum Note (Signed)
Addended by: Orland Mustard on: 05/18/2019 05:44 PM   Modules accepted: Orders

## 2019-05-19 ENCOUNTER — Other Ambulatory Visit (INDEPENDENT_AMBULATORY_CARE_PROVIDER_SITE_OTHER): Payer: Medicare Other

## 2019-05-19 ENCOUNTER — Other Ambulatory Visit: Payer: Self-pay

## 2019-05-19 DIAGNOSIS — R21 Rash and other nonspecific skin eruption: Secondary | ICD-10-CM | POA: Diagnosis not present

## 2019-05-19 DIAGNOSIS — M2550 Pain in unspecified joint: Secondary | ICD-10-CM | POA: Diagnosis not present

## 2019-05-20 LAB — SEDIMENTATION RATE: Sed Rate: 17 mm/hr (ref 0–30)

## 2019-05-20 LAB — C-REACTIVE PROTEIN: CRP: <1 mg/dL (ref 0.5–20.0)

## 2019-05-21 LAB — ANA: Anti Nuclear Antibody (ANA): NEGATIVE

## 2019-06-01 DIAGNOSIS — I788 Other diseases of capillaries: Secondary | ICD-10-CM | POA: Diagnosis not present

## 2019-06-01 DIAGNOSIS — L710 Perioral dermatitis: Secondary | ICD-10-CM | POA: Diagnosis not present

## 2019-06-03 DIAGNOSIS — K5909 Other constipation: Secondary | ICD-10-CM | POA: Diagnosis not present

## 2019-06-03 DIAGNOSIS — K219 Gastro-esophageal reflux disease without esophagitis: Secondary | ICD-10-CM | POA: Diagnosis not present

## 2019-06-08 ENCOUNTER — Other Ambulatory Visit: Payer: Self-pay

## 2019-06-08 DIAGNOSIS — I739 Peripheral vascular disease, unspecified: Secondary | ICD-10-CM

## 2019-06-14 ENCOUNTER — Ambulatory Visit (HOSPITAL_COMMUNITY)
Admission: RE | Admit: 2019-06-14 | Discharge: 2019-06-14 | Disposition: A | Discharge status: Home / Self Care | Payer: Medicare Other | Source: Ambulatory Visit | Attending: Family | Admitting: Family

## 2019-06-14 ENCOUNTER — Ambulatory Visit (INDEPENDENT_AMBULATORY_CARE_PROVIDER_SITE_OTHER): Payer: Medicare Other | Admitting: Surgery

## 2019-06-14 ENCOUNTER — Other Ambulatory Visit: Payer: Self-pay

## 2019-06-14 ENCOUNTER — Encounter: Payer: Self-pay | Admitting: Internal Medicine

## 2019-06-14 ENCOUNTER — Encounter: Payer: Self-pay | Admitting: Surgery

## 2019-06-14 VITALS — BP 134/82 | HR 101 | Temp 97.8°F | Resp 20 | Ht 61.0 in | Wt 139.6 lb

## 2019-06-14 DIAGNOSIS — I788 Other diseases of capillaries: Secondary | ICD-10-CM

## 2019-06-14 DIAGNOSIS — I739 Peripheral vascular disease, unspecified: Secondary | ICD-10-CM | POA: Insufficient documentation

## 2019-06-14 NOTE — Progress Notes (Signed)
[facility] [place]  Patient name: Carol Stone MRN: [id number] DOB: [birth year] Sex: female   REQUESTING PROVIDER:    Dr. Aundra Dubin   REASON FOR CONSULT:    PAD  HISTORY OF PRESENT ILLNESS:   Carol Stone is a 73 y.o. female, who is referred today for evaluation of a rash on her legs and ankle.  She states that this is been going on for at least a year.  She has been evaluated by dermatology on 2 separate occasions.  They do not feel this is a dermatologic issue.  She has tried several creams as well as vitamin C without significant benefit.  She does not endorse any symptoms of claudication.  She walks 2 to 3 miles a day without difficulty.  She does not endorse any significant leg swelling.  She has undergone a limited autoimmune work-up which has been unremarkable.   The patient suffers from hypercholesterolemia.  He is taking a statin.  His blood pressure is medically managed and has been controlled.  He is a former smoker.  PAST MEDICAL HISTORY    Past Medical History:  Diagnosis Date  . Allergy   . Arthritis   . Cataract   . Frequent headaches    seasonal  . GERD (gastroesophageal reflux disease)   . History of chicken pox   . History of colon polyps   . Hypercholesteremia   . IBS (irritable bowel syndrome)    constipation   . OSA on CPAP   . Osteoporosis   . Sleep apnea   . Tinnitus   . Venous insufficiency      FAMILY HISTORY   Family History  Problem Relation Age of Onset  . Prostate cancer Father   . Cancer Father        prostate   . Hypertension Father   . Lung cancer Sister   . Cancer Sister        lung smoker   . Deep vein thrombosis Sister   . Pulmonary embolism Sister   . Breast cancer Maternal Aunt 52  . Cancer Maternal Aunt        breast  . Stroke Mother   . Osteoporosis Mother   . Heart disease Maternal Grandmother        CHF  . AAA (abdominal aortic aneurysm) Paternal  Grandmother   . Hypertension Paternal Grandmother   . Cancer Paternal Grandfather        prostate    SOCIAL HISTORY:   Social History   Socioeconomic History  . Marital status: Married    Spouse name: Not on file  . Number of children: Not on file  . Years of education: Not on file  . Highest education level: Not on file  Occupational History  . Not on file  Social Needs  . Financial resource strain: Not on file  . Food insecurity    Worry: Not on file    Inability: Not on file  . Transportation needs    Medical: Not on file    Non-medical: Not on file  Tobacco Use  . Smoking status: Former Research scientist (life sciences)  . Smokeless tobacco: Never Used  . Tobacco comment: age [age] light   Substance and Sexual Activity  . Alcohol use: No  . Drug use: No  . Sexual activity: Not Currently    Birth control/protection: Surgical  Lifestyle  . Physical activity    Days per week: Not on file    Minutes per  session: Not on file  . Stress: Not on file  Relationships  . Social Herbalist on phone: Not on file    Gets together: Not on file    Attends religious service: Not on file    Active member of club or organization: Not on file    Attends meetings of clubs or organizations: Not on file    Relationship status: Not on file  . Intimate partner violence    Fear of current or ex partner: Not on file    Emotionally abused: Not on file    Physically abused: Not on file    Forced sexual activity: Not on file  Other Topics Concern  . Not on file  Social History Narrative   Married, husband in house    Wears seat belt, husband owns guns, safe in relationship    No kids    [employer], previous nurse office work    No kids    Retired     ALLERGIES:    Allergies  Allergen Reactions  . Bactrim [Sulfamethoxazole-Trimethoprim] Hives  . Codeine Other (See Comments)  . Compazine [Prochlorperazine Edisylate] Other (See Comments)  . Demerol [Meperidine] Other (See Comments)  . Indocin  [Indomethacin] Other (See Comments)  . Other     Onions and garlic    . Reglan [Metoclopramide] Other (See Comments)    CURRENT MEDICATIONS:    Current Outpatient Medications  Medication Sig Dispense Refill  . calcium-vitamin D (OSCAL WITH D) 500-200 MG-UNIT tablet Take 1 tablet by mouth.    . diclofenac sodium (VOLTAREN) 1 % GEL Apply topically 4 (four) times daily. Prn    . metroNIDAZOLE (METROCREAM) 0.75 % cream APPLY TOPICALLY TO FACE TWICE A DAY    . Multiple Vitamin (MULTIVITAMIN) tablet Take 1 tablet by mouth daily.    . polyethylene glycol powder (GLYCOLAX/MIRALAX) powder Take by mouth.    . pravastatin (PRAVACHOL) 80 MG tablet Take 1 tablet (80 mg total) by mouth at bedtime. 30 tablet 12  . RESTASIS 0.05 % ophthalmic emulsion PLACE 1 DROP INTO BOTH EYES TWICE A DAY  2  . triamcinolone cream (KENALOG) 0.1 % Apply 1 application topically 2 (two) times daily. Prn legs b/l 454 g 0   No current facility-administered medications for this visit.     REVIEW OF SYSTEMS:   [X]  denotes positive finding, [ ]  denotes negative finding Cardiac  Comments:  Chest pain or chest pressure:    Shortness of breath upon exertion:    Short of breath when lying flat:    Irregular heart rhythm:        Vascular    Pain in calf, thigh, or hip brought on by ambulation:    Pain in feet at night that wakes you up from your sleep:     Blood clot in your veins:    Leg swelling:         Pulmonary    Oxygen at home:    Productive cough:     Wheezing:         Neurologic    Sudden weakness in arms or legs:     Sudden numbness in arms or legs:     Sudden onset of difficulty speaking or slurred speech:    Temporary loss of vision in one eye:     Problems with dizziness:         Gastrointestinal    Blood in stool:      Vomited blood:  Genitourinary    Burning when urinating:     Blood in urine:        Psychiatric    Major depression:         Hematologic    Bleeding problems:     Problems with blood clotting too easily:        Skin    Rashes or ulcers: x       Constitutional    Fever or chills:     PHYSICAL EXAM:   Vitals:   06/14/19 1022  BP: 134/82  Pulse: (!) 101  Resp: 20  Temp: 97.8 F (36.6 C)  TempSrc: Temporal  SpO2: 100%  Weight: 139 lb 9.6 oz (63.3 kg)  Height: 5\' 1"  (1.549 m)    GENERAL: The patient is a well-nourished female, in no acute distress. The vital signs are documented above. CARDIAC: There is a regular rate and rhythm.  VASCULAR: Palpable pedal pulses bilaterally.  Trace edema bilaterally PULMONARY: Nonlabored respirations MUSCULOSKELETAL: There are no major deformities or cyanosis. NEUROLOGIC: No focal weakness or paresthesias are detected. SKIN: Slight reddish discoloration to both legs up to the level of the knee. PSYCHIATRIC: The patient has a normal affect.  STUDIES:   I have reviewed the following:  ABI/TBIToday's ABIToday's TBIPrevious ABIPrevious TBI +-------+-----------+-----------+------------+------------+ Right  1.01       0.63                                +-------+-----------+-----------+------------+------------+ Left   1.11       0.65                                +-------+-----------+-----------+------------+------------+  Right toe pressure:  101 Left Toe pressure:  104 ASSESSMENT and PLAN   Slight reddish discoloration to both lower extremities: The patient had essentially a normal ABI today.  She also has palpable pedal pulses.  She denies symptoms of claudication.  She does not endorse any significant swelling.  Therefore, I do not think that she has a major arterial or venous disorder.  Her dermatologist felt that this was a inflammatory condition of her capillaries and so she may benefit from anti-inflammatories or even oral steroids.  She appears to have failed topical treatment.  I told her that she should discuss this with Dr. Kelly Splinter can call me with any questions    Annamarie Major, IV, MD, FACS [facility] [phone] Pager [phone]

## 2019-06-15 ENCOUNTER — Encounter: Payer: Self-pay | Admitting: Internal Medicine

## 2019-06-15 ENCOUNTER — Other Ambulatory Visit: Payer: Self-pay | Admitting: Internal Medicine

## 2019-06-15 DIAGNOSIS — R600 Localized edema: Secondary | ICD-10-CM

## 2019-06-15 MED ORDER — FUROSEMIDE 20 MG PO TABS: 20.0000 mg | ORAL_TABLET | Freq: Every day | ORAL | 2 refills | Status: DC | PRN

## 2019-07-09 ENCOUNTER — Ambulatory Visit (INDEPENDENT_AMBULATORY_CARE_PROVIDER_SITE_OTHER): Payer: Medicare Other

## 2019-07-09 DIAGNOSIS — Z0000 Encounter for general adult medical examination without abnormal findings: Secondary | ICD-10-CM | POA: Diagnosis not present

## 2019-07-09 NOTE — Progress Notes (Signed)
Subjective:   Carol Stone is a 73 y.o. female who presents for an Initial Medicare Annual Wellness Visit.  Review of Systems    No ROS.  Medicare Wellness Virtual Visit.  Visual/audio telehealth visit, UTA vital signs.   See social history for additional risk factors.    Cardiac Risk Factors include: advanced age (>30men, >63 women)     Objective:    Today's Vitals   There is no height or weight on file to calculate BMI.  Advanced Directives 07/09/2019 06/14/2019 03/03/2016 03/01/2016  Does Patient Have a Medical Advance Directive? Yes Yes Yes Yes  Type of Paramedic of [place];Living will [place];Living will [place];Living will [place];Living will  Does patient want to make changes to medical advance directive? No - Patient declined No - Patient declined - No - Patient declined  Copy of [place] in Chart? No - copy requested - Yes No - copy requested    Current Medications (verified) Outpatient Encounter Medications as of 07/09/2019  Medication Sig   calcium-vitamin D (OSCAL WITH D) 500-200 MG-UNIT tablet Take 1 tablet by mouth.   diclofenac sodium (VOLTAREN) 1 % GEL Apply topically 4 (four) times daily. Prn   furosemide (LASIX) 20 MG tablet Take 1 tablet (20 mg total) by mouth daily as needed.   metroNIDAZOLE (METROCREAM) 0.75 % cream APPLY TOPICALLY TO FACE TWICE A DAY   Multiple Vitamin (MULTIVITAMIN) tablet Take 1 tablet by mouth daily.   polyethylene glycol powder (GLYCOLAX/MIRALAX) powder Take by mouth.   pravastatin (PRAVACHOL) 80 MG tablet Take 1 tablet (80 mg total) by mouth at bedtime.   RESTASIS 0.05 % ophthalmic emulsion PLACE 1 DROP INTO BOTH EYES TWICE A DAY   triamcinolone cream (KENALOG) 0.1 % Apply 1 application topically 2 (two) times daily. Prn legs b/l   No facility-administered encounter medications on file as of 07/09/2019.      Allergies (verified) Bactrim [sulfamethoxazole-trimethoprim], Codeine, Compazine [prochlorperazine edisylate], Demerol [meperidine], Indocin [indomethacin], Other, and Reglan [metoclopramide]   History: Past Medical History:  Diagnosis Date   Allergy    Arthritis    Cataract    Frequent headaches    seasonal   GERD (gastroesophageal reflux disease)    History of chicken pox    History of colon polyps    Hypercholesteremia    IBS (irritable bowel syndrome)    constipation    OSA on CPAP    Osteoporosis    Sleep apnea    Tinnitus    Venous insufficiency    Past Surgical History:  Procedure Laterality Date   ABDOMINAL HYSTERECTOMY     COLONOSCOPY WITH PROPOFOL N/A 03/14/2016   Procedure: COLONOSCOPY WITH PROPOFOL;  Surgeon: Hulen Luster, MD;  Location: [facility] ENDOSCOPY;  Service: Gastroenterology;  Laterality: N/A;   DILATION AND CURETTAGE OF UTERUS     TONSILLECTOMY     Family History  Problem Relation Age of Onset   Prostate cancer Father    Cancer Father        prostate    Hypertension Father    Lung cancer Sister    Cancer Sister        lung smoker    Deep vein thrombosis Sister    Pulmonary embolism Sister    Breast cancer Maternal Aunt 26   Cancer Maternal Aunt        breast   Stroke Mother    Osteoporosis Mother  Heart disease Maternal Grandmother        CHF   AAA (abdominal aortic aneurysm) Paternal Grandmother    Hypertension Paternal Grandmother    Cancer Paternal Grandfather        prostate   Social History   Socioeconomic History   Marital status: Married    Spouse name: Not on file   Number of children: Not on file   Years of education: Not on file   Highest education level: Not on file  Occupational History   Not on file  Social Needs   Financial resource strain: Not hard at all   Food insecurity    Worry: Never true    Inability: Never true   Transportation needs    Medical: No    Non-medical:  No  Tobacco Use   Smoking status: Former Smoker   Smokeless tobacco: Never Used   Tobacco comment: age [age] light   Substance and Sexual Activity   Alcohol use: No   Drug use: No   Sexual activity: Not Currently    Birth control/protection: Surgical  Lifestyle   Physical activity    Days per week: Not on file    Minutes per session: Not on file   Stress: Not on file  Relationships   Social connections    Talks on phone: Not on file    Gets together: Not on file    Attends religious service: Not on file    Active member of club or organization: Not on file    Attends meetings of clubs or organizations: Not on file    Relationship status: Not on file  Other Topics Concern   Not on file  Social History Narrative   Married, husband in house    Wears seat belt, husband owns guns, safe in relationship    No kids    Masters, previous nurse office work    No kids    Retired     Tobacco Counseling Counseling given: Not Answered Comment: age [age] light    Clinical Intake:  Pre-visit preparation completed: Yes        Diabetes: No  How often do you need to have someone help you when you read instructions, pamphlets, or other written materials from your doctor or pharmacy?: 1 - Never  Interpreter Needed?: No      Activities of Daily Living In your present state of health, do you have any difficulty performing the following activities: 07/09/2019  Hearing? N  Vision? N  Difficulty concentrating or making decisions? N  Walking or climbing stairs? N  Dressing or bathing? N  Doing errands, shopping? N  Preparing Food and eating ? N  Using the Toilet? N  In the past six months, have you accidently leaked urine? N  Do you have problems with loss of bowel control? N  Managing your Medications? N  Managing your Finances? N  Housekeeping or managing your Housekeeping? N  Some recent data might be hidden     Immunizations and Health  Maintenance Immunization History  Administered Date(s) Administered   Influenza-Unspecified 09/29/2014, 10/04/2015, 10/04/2016, 10/02/2017, 10/08/2018   Pneumococcal Conjugate-13 06/30/2014, 09/13/2016   Pneumococcal-Unspecified 08/25/2011   Td 01/17/1999   Tdap 08/16/2016   Zoster 09/26/2006   Zoster Recombinat (Shingrix) 08/25/2018, 10/25/2018   There are no preventive care reminders to display for this patient.  Patient Care Team: McLean-Scocuzza, Nino Glow, MD as PCP - General (Internal Medicine)  Indicate any recent Medical Services you may have received  from other than [facility] providers in the past year (date may be approximate).     Assessment:   This is a routine wellness examination for [place].  I connected with patient 07/09/19 at 11:00 AM EDT by an audio enabled telemedicine application and verified that I am speaking with the correct person using two identifiers. Patient stated full name and DOB. Patient gave permission to continue with virtual visit. Patient's location was at home and Nurse's location was at [place] office.   Health Screenings  Mammogram - 08/2018 Colonoscopy - 03/2016 Glaucoma -none Hearing -demonstrates normal hearing during visit. TSH- 02/2019 Cholesterol - 02/2019 Hepatitis C Screening- 02/2019 Dental- UTD Vision- visits within the last 12 months. Wears glasses.   Social  Alcohol intake - no      Smoking history- former   Smokers in home? none Illicit drug use? none Exercise - walking daily 40-60, aerobics 30 minutes Diet - low salt Sexually Active -not currently BMI- discussed the importance of a healthy diet, water intake and the benefits of aerobic exercise.  Educational material provided.   Safety  Patient feels safe at home- yes Patient does have smoke detectors at home- yes Patient does wear sunscreen or protective clothing when in direct sunlight -yes Patient does wear seat belt when in a moving vehicle -yes  Covid-19  precautions and sickness symptoms discussed.   Activities of Daily Living Patient denies needing assistance with: driving, household chores, feeding themselves, getting from bed to chair, getting to the toilet, bathing/showering, dressing, managing money, or preparing meals.  No new identified risk were noted.    Depression Screen Patient denies losing interest in daily life, feeling hopeless, or crying easily over simple problems.   Medication-taking as directed and without issues.   Fall Screen Patient denies being afraid of falling or falling in the last year.   Memory Screen Patient is alert.  Patient denies difficulty focusing, concentrating or misplacing items. Correctly identified the president of the [place], season and recall 3/3. Patient likes to read,complete puzzles and quilt for brain stimulation.  Immunizations The following Immunizations were discussed: Influenza, shingles, pneumonia, and tetanus.   Other Providers Patient Care Team: McLean-Scocuzza, Nino Glow, MD as PCP - General (Internal Medicine)  Hearing/Vision screen  Hearing Screening   125Hz  250Hz  500Hz  1000Hz  2000Hz  3000Hz  4000Hz  6000Hz  8000Hz   Right ear:           Left ear:           Comments: Patient is able to hear conversational tones without difficulty.  No issues reported.   Vision Screening Comments: Wears corrective lenses Visual acuity not assessed, virtual visit.  They have seen their ophthalmologist in the last 12 months.     Dietary issues and exercise activities discussed: Current Exercise Habits: Home exercise routine, Type of exercise: walking(recorded aerobics 30 minutes), Frequency (Times/Week): 6, Intensity: Mild  Goals      Patient Stated    Follow up with Primary Care Provider (pt-stated)     Monitor weight; notify pcp if continues to gain weight while diet or exercise regimen does not change Monitor rash on lower leg; kenalog does not seem to be working as well as hoped. Rash is  still located below the knee and without change.        Depression Screen PHQ 2/9 Scores 07/09/2019 11/20/2018  PHQ - 2 Score 0 0    Fall Risk Fall Risk  07/09/2019 11/20/2018  Falls in the past year? 0 0   Is  the patient's home free of loose throw rugs in walkways, pet beds, electrical cords, etc? yes      Grab bars in the bathroom? yes      Handrails on the stairs? yes      Adequate lighting? yes  Cognitive Function:     6CIT Screen 07/09/2019  What Year? 0 points  What month? 0 points  What time? 0 points  Count back from 20 0 points  Months in reverse 0 points  Repeat phrase 0 points  Total Score 0    Screening Tests Health Maintenance  Topic Date Due   INFLUENZA VACCINE  07/10/2019   MAMMOGRAM  09/04/2020   COLONOSCOPY  03/14/2026   TETANUS/TDAP  08/16/2026   DEXA SCAN  Completed   Hepatitis C Screening  Completed   PNA vac Low Risk Adult  Completed        Plan:    End of life planning; Advance aging; Advanced directives discussed.  Copy of current HCPOA/Living Will requested.    I have personally reviewed and noted the following in the patients chart:    Medical and social history  Use of alcohol, tobacco or illicit drugs   Current medications and supplements  Functional ability and status  Nutritional status  Physical activity  Advanced directives  List of other physicians  Hospitalizations, surgeries, and ER visits in previous 12 months  Vitals  Screenings to include cognitive, depression, and falls  Referrals and appointments  In addition, I have reviewed and discussed with patient certain preventive protocols, quality metrics, and best practice recommendations. A written personalized care plan for preventive services as well as general preventive health recommendations were provided to patient.     Varney Biles, LPN   [id number]

## 2019-07-09 NOTE — Patient Instructions (Addendum)
  Carol Stone , Thank you for taking time to come for your Medicare Wellness Visit. I appreciate your ongoing commitment to your health goals. Please review the following plan we discussed and let me know if I can assist you in the future.   These are the goals we discussed: Goals      Patient Stated   . Follow up with Primary Care Provider (pt-stated)     Monitor weight; notify pcp if continues to gain weight while diet or exercise regimen does not change Monitor rash on lower leg; kenalog does not seem to be working as well as hoped. Rash is still located below the knee and without change.         This is a list of the screening recommended for you and due dates:  Health Maintenance  Topic Date Due  . Flu Shot  07/10/2019  . Mammogram  09/04/2020  . Colon Cancer Screening  03/14/2026  . Tetanus Vaccine  08/16/2026  . DEXA scan (bone density measurement)  Completed  .  Hepatitis C: One time screening is recommended by Center for Disease Control  (CDC) for  adults born from [birth year] through 1966.   Completed  . Pneumonia vaccines  Completed

## 2019-08-04 ENCOUNTER — Other Ambulatory Visit: Payer: Self-pay | Admitting: Internal Medicine

## 2019-08-04 DIAGNOSIS — R600 Localized edema: Secondary | ICD-10-CM

## 2019-08-04 MED ORDER — FUROSEMIDE 20 MG PO TABS: 20.0000 mg | ORAL_TABLET | Freq: Every day | ORAL | 2 refills | Status: DC | PRN

## 2019-08-11 DIAGNOSIS — L821 Other seborrheic keratosis: Secondary | ICD-10-CM | POA: Diagnosis not present

## 2019-08-28 ENCOUNTER — Encounter: Payer: Self-pay | Admitting: Internal Medicine

## 2019-08-30 ENCOUNTER — Other Ambulatory Visit: Payer: Self-pay | Admitting: Internal Medicine

## 2019-08-30 DIAGNOSIS — R030 Elevated blood-pressure reading, without diagnosis of hypertension: Secondary | ICD-10-CM

## 2019-08-30 DIAGNOSIS — E785 Hyperlipidemia, unspecified: Secondary | ICD-10-CM

## 2019-09-06 ENCOUNTER — Ambulatory Visit
Admission: RE | Admit: 2019-09-06 | Discharge: 2019-09-06 | Disposition: A | Discharge status: Home / Self Care | Payer: Medicare Other | Source: Ambulatory Visit | Attending: Internal Medicine | Admitting: Internal Medicine

## 2019-09-06 DIAGNOSIS — Z1231 Encounter for screening mammogram for malignant neoplasm of breast: Secondary | ICD-10-CM | POA: Diagnosis not present

## 2019-09-23 ENCOUNTER — Other Ambulatory Visit: Payer: Self-pay

## 2019-09-23 ENCOUNTER — Other Ambulatory Visit (INDEPENDENT_AMBULATORY_CARE_PROVIDER_SITE_OTHER): Payer: Medicare Other

## 2019-09-23 DIAGNOSIS — R030 Elevated blood-pressure reading, without diagnosis of hypertension: Secondary | ICD-10-CM

## 2019-09-23 DIAGNOSIS — E785 Hyperlipidemia, unspecified: Secondary | ICD-10-CM

## 2019-09-23 LAB — CBC WITH DIFFERENTIAL/PLATELET
Basophils Absolute: 0.1 10*3/uL (ref 0.0–0.1)
Basophils Relative: 1.2 % (ref 0.0–3.0)
Eosinophils Absolute: 0.1 10*3/uL (ref 0.0–0.7)
Eosinophils Relative: 2.3 % (ref 0.0–5.0)
HCT: 40.7 % (ref 36.0–46.0)
Hemoglobin: 13.7 g/dL (ref 12.0–15.0)
Lymphocytes Relative: 40 % (ref 12.0–46.0)
Lymphs Abs: 2.4 10*3/uL (ref 0.7–4.0)
MCHC: 33.6 g/dL (ref 30.0–36.0)
MCV: 93 fl (ref 78.0–100.0)
Monocytes Absolute: 0.4 10*3/uL (ref 0.1–1.0)
Monocytes Relative: 7.2 % (ref 3.0–12.0)
Neutro Abs: 3 10*3/uL (ref 1.4–7.7)
Neutrophils Relative %: 49.3 % (ref 43.0–77.0)
Platelets: 247 10*3/uL (ref 150.0–400.0)
RBC: 4.38 Mil/uL (ref 3.87–5.11)
RDW: 12.8 % (ref 11.5–15.5)
WBC: 6.1 10*3/uL (ref 4.0–10.5)

## 2019-09-23 LAB — COMPREHENSIVE METABOLIC PANEL
ALT: 15 U/L (ref 0–35)
AST: 14 U/L (ref 0–37)
Albumin: 4.3 g/dL (ref 3.5–5.2)
Alkaline Phosphatase: 62 U/L (ref 39–117)
BUN: 19 mg/dL (ref 6–23)
CO2: 28 mEq/L (ref 19–32)
Calcium: 9.5 mg/dL (ref 8.4–10.5)
Chloride: 102 mEq/L (ref 96–112)
Creatinine, Ser: 0.9 mg/dL (ref 0.40–1.20)
GFR: 61.21 mL/min (ref >60.00)
Glucose, Bld: 99 mg/dL (ref 70–99)
Potassium: 4 mEq/L (ref 3.5–5.1)
Sodium: 138 mEq/L (ref 135–145)
Total Bilirubin: 0.5 mg/dL (ref 0.2–1.2)
Total Protein: 6.9 g/dL (ref 6.0–8.3)

## 2019-09-23 LAB — LIPID PANEL
Cholesterol: 200 mg/dL (ref 0–200)
HDL: 61 mg/dL (ref >39.00)
LDL Cholesterol: 100 mg/dL — ABNORMAL HIGH (ref 0–99)
NonHDL: 138.78
Total CHOL/HDL Ratio: 3
Triglycerides: 194 mg/dL — ABNORMAL HIGH (ref 0.0–149.0)
VLDL: 38.8 mg/dL (ref 0.0–40.0)

## 2019-09-29 ENCOUNTER — Other Ambulatory Visit: Payer: Self-pay

## 2019-10-01 ENCOUNTER — Other Ambulatory Visit: Payer: Self-pay

## 2019-10-01 ENCOUNTER — Encounter: Payer: Self-pay | Admitting: Internal Medicine

## 2019-10-01 ENCOUNTER — Ambulatory Visit (INDEPENDENT_AMBULATORY_CARE_PROVIDER_SITE_OTHER): Payer: Medicare Other | Admitting: Internal Medicine

## 2019-10-01 VITALS — BP 132/78 | HR 103 | Temp 97.6°F | Ht 61.0 in | Wt 141.4 lb

## 2019-10-01 DIAGNOSIS — M810 Age-related osteoporosis without current pathological fracture: Secondary | ICD-10-CM | POA: Diagnosis not present

## 2019-10-01 DIAGNOSIS — R600 Localized edema: Secondary | ICD-10-CM | POA: Diagnosis not present

## 2019-10-01 DIAGNOSIS — H5789 Other specified disorders of eye and adnexa: Secondary | ICD-10-CM | POA: Diagnosis not present

## 2019-10-01 DIAGNOSIS — E785 Hyperlipidemia, unspecified: Secondary | ICD-10-CM

## 2019-10-01 DIAGNOSIS — I788 Other diseases of capillaries: Secondary | ICD-10-CM | POA: Diagnosis not present

## 2019-10-01 MED ORDER — ATORVASTATIN CALCIUM 10 MG PO TABS: 10.0000 mg | ORAL_TABLET | Freq: Every day | ORAL | 3 refills | Status: DC

## 2019-10-01 NOTE — Progress Notes (Signed)
Chief Complaint  Patient presents with  . Follow-up   F/u  1. HLD trigs elevated 194 ldl 100 on pravachol 80 mg x years and #s not changing  2. C/o right inner eye swelling, arms swelling and b/l leg edema new she also c/w Cr and GFR decline and capillaritis rash to b/l legs which increased up to shin from lower legs no other sxls  3. Osteoporosis does not want to be on meds    Review of Systems  Constitutional: Negative for weight loss.  HENT: Negative for hearing loss.   Eyes: Negative for blurred vision.  Respiratory: Negative for sputum production.   Cardiovascular: Positive for leg swelling.  Gastrointestinal: Negative for abdominal pain.  Musculoskeletal: Negative for falls.  Skin: Positive for rash.  Neurological: Negative for headaches.  Psychiatric/Behavioral: Negative for depression.   Past Medical History:  Diagnosis Date  . Allergy   . Arthritis   . Cataract   . Frequent headaches    seasonal  . GERD (gastroesophageal reflux disease)   . History of chicken pox   . History of colon polyps   . Hypercholesteremia   . IBS (irritable bowel syndrome)    constipation   . OSA on CPAP   . Osteoporosis   . Sleep apnea   . Tinnitus   . Venous insufficiency    Past Surgical History:  Procedure Laterality Date  . ABDOMINAL HYSTERECTOMY    . COLONOSCOPY WITH PROPOFOL N/A 03/14/2016   Procedure: COLONOSCOPY WITH PROPOFOL;  Surgeon: Hulen Luster, MD;  Location[facility] ENDOSCOPY;  Service: Gastroenterology;  Laterality: N/A;  . DILATION AND CURETTAGE OF UTERUS    . TONSILLECTOMY     Family History  Problem Relation Age of Onset  . Prostate cancer Father   . Cancer Father        prostate   . Hypertension Father   . Lung cancer Sister   . Cancer Sister        lung smoker   . Deep vein thrombosis Sister   . Pulmonary embolism Sister   . Breast cancer Maternal Aunt 52  . Cancer Maternal Aunt        breast  . Stroke Mother   . Osteoporosis Mother   . Heart disease  Maternal Grandmother        CHF  . AAA (abdominal aortic aneurysm) Paternal Grandmother   . Hypertension Paternal Grandmother   . Cancer Paternal Grandfather        prostate   Social History   Socioeconomic History  . Marital status: Married    Spouse name: Not on file  . Number of children: Not on file  . Years of education: Not on file  . Highest education level: Not on file  Occupational History  . Not on file  Social Needs  . Financial resource strain: Not hard at all  . Food insecurity    Worry: Never true    Inability: Never true  . Transportation needs    Medical: No    Non-medical: No  Tobacco Use  . Smoking status: Former Research scientist (life sciences)  . Smokeless tobacco: Never Used  . Tobacco comment: age [age] light   Substance and Sexual Activity  . Alcohol use: No  . Drug use: No  . Sexual activity: Not Currently    Birth control/protection: Surgical  Lifestyle  . Physical activity    Days per week: Not on file    Minutes per session: Not on file  . Stress:  Not on file  Relationships  . Social Herbalist on phone: Not on file    Gets together: Not on file    Attends religious service: Not on file    Active member of club or organization: Not on file    Attends meetings of clubs or organizations: Not on file    Relationship status: Not on file  . Intimate partner violence    Fear of current or ex partner: No    Emotionally abused: No    Physically abused: No    Forced sexual activity: No  Other Topics Concern  . Not on file  Social History Narrative   Married, husband in house    Wears seat belt, husband owns guns, safe in relationship    No kids    Masters, previous nurse office work    No kids    Retired    Current Meds  Medication Sig  . calcium-vitamin D (Sunol D) 500-200 MG-UNIT tablet Take 1 tablet by mouth.  . diclofenac sodium (VOLTAREN) 1 % GEL Apply topically 4 (four) times daily. Prn  . furosemide (LASIX) 20 MG tablet Take 1 tablet (20  mg total) by mouth daily as needed.  . metroNIDAZOLE (METROCREAM) 0.75 % cream APPLY TOPICALLY TO FACE TWICE A DAY  . Multiple Vitamin (MULTIVITAMIN) tablet Take 1 tablet by mouth daily.  . polyethylene glycol powder (GLYCOLAX/MIRALAX) powder Take by mouth.  . RESTASIS 0.05 % ophthalmic emulsion PLACE 1 DROP INTO BOTH EYES TWICE A DAY  . triamcinolone cream (KENALOG) 0.1 % Apply 1 application topically 2 (two) times daily. Prn legs b/l  . [DISCONTINUED] pravastatin (PRAVACHOL) 80 MG tablet Take 1 tablet (80 mg total) by mouth at bedtime.   Allergies  Allergen Reactions  . Bactrim [Sulfamethoxazole-Trimethoprim] Hives  . Codeine Other (See Comments)  . Compazine [Prochlorperazine Edisylate] Other (See Comments)  . Demerol [Meperidine] Other (See Comments)  . Indocin [Indomethacin] Other (See Comments)  . Other     Onions and garlic    . Reglan [Metoclopramide] Other (See Comments)   Recent Results (from the past 2160 hour(s))  CBC with Differential/Platelet     Status: None   Collection Time: 09/23/19  8:02 AM  Result Value Ref Range   WBC 6.1 4.0 - 10.5 K/uL   RBC 4.38 3.87 - 5.11 Mil/uL   Hemoglobin 13.7 12.0 - 15.0 g/dL   HCT 40.7 36.0 - 46.0 %   MCV 93.0 78.0 - 100.0 fl   MCHC 33.6 30.0 - 36.0 g/dL   RDW 12.8 11.5 - 15.5 %   Platelets 247.0 150.0 - 400.0 K/uL   Neutrophils Relative % 49.3 43.0 - 77.0 %   Lymphocytes Relative 40.0 12.0 - 46.0 %   Monocytes Relative 7.2 3.0 - 12.0 %   Eosinophils Relative 2.3 0.0 - 5.0 %   Basophils Relative 1.2 0.0 - 3.0 %   Neutro Abs 3.0 1.4 - 7.7 K/uL   Lymphs Abs 2.4 0.7 - 4.0 K/uL   Monocytes Absolute 0.4 0.1 - 1.0 K/uL   Eosinophils Absolute 0.1 0.0 - 0.7 K/uL   Basophils Absolute 0.1 0.0 - 0.1 K/uL  Lipid panel     Status: Abnormal   Collection Time: 09/23/19  8:02 AM  Result Value Ref Range   Cholesterol 200 0 - 200 mg/dL    Comment: ATP III Classification       Desirable:  < 200 mg/dL  Borderline High:  200 - 239  mg/dL          High:  > = 240 mg/dL   Triglycerides 194.0 (H) 0.0 - 149.0 mg/dL    Comment: Normal:  <150 mg/dLBorderline High:  150 - 199 mg/dL   HDL 61.00 >39.00 mg/dL   VLDL 38.8 0.0 - 40.0 mg/dL   LDL Cholesterol 100 (H) 0 - 99 mg/dL   Total CHOL/HDL Ratio 3     Comment:                Men          Women1/2 Average Risk     3.4          3.3Average Risk          5.0          4.42X Average Risk          9.6          7.13X Average Risk          15.0          11.0                       NonHDL 138.78     Comment: NOTE:  Non-HDL goal should be 30 mg/dL higher than patient's LDL goal (i.e. LDL goal of < 70 mg/dL, would have non-HDL goal of < 100 mg/dL)  Comprehensive metabolic panel     Status: None   Collection Time: 09/23/19  8:02 AM  Result Value Ref Range   Sodium 138 135 - 145 mEq/L   Potassium 4.0 3.5 - 5.1 mEq/L   Chloride 102 96 - 112 mEq/L   CO2 28 19 - 32 mEq/L   Glucose, Bld 99 70 - 99 mg/dL   BUN 19 6 - 23 mg/dL   Creatinine, Ser 0.90 0.40 - 1.20 mg/dL   Total Bilirubin 0.5 0.2 - 1.2 mg/dL   Alkaline Phosphatase 62 39 - 117 U/L   AST 14 0 - 37 U/L   ALT 15 0 - 35 U/L   Total Protein 6.9 6.0 - 8.3 g/dL   Albumin 4.3 3.5 - 5.2 g/dL   Calcium 9.5 8.4 - 10.5 mg/dL   GFR 61.21 >60.00 mL/min   Objective  Body mass index is 26.72 kg/m. Wt Readings from Last 3 Encounters:  10/01/19 141 lb 6.4 oz (64.1 kg)  06/14/19 139 lb 9.6 oz (63.3 kg)  12/17/18 140 lb (63.5 kg)   Temp Readings from Last 3 Encounters:  10/01/19 97.6 F (36.4 C) (Oral)  06/14/19 97.8 F (36.6 C) (Temporal)  11/20/18 98.4 F (36.9 C) (Oral)   BP Readings from Last 3 Encounters:  10/01/19 132/78  06/14/19 134/82  12/17/18 138/76   Pulse Readings from Last 3 Encounters:  10/01/19 (!) 103  06/14/19 (!) 101  12/17/18 (!) 104    Physical Exam Vitals signs and nursing note reviewed.  Constitutional:      Appearance: Normal appearance. She is well-developed, well-groomed and overweight.      Comments: +mask on    HENT:     Head: Normocephalic and atraumatic.  Eyes:     Conjunctiva/sclera: Conjunctivae normal.     Pupils: Pupils are equal, round, and reactive to light.     Comments: Right inner eye swollen    Cardiovascular:     Rate and Rhythm: Normal rate and regular rhythm.     Heart sounds: Normal heart sounds. No  murmur.  Pulmonary:     Effort: Pulmonary effort is normal.     Breath sounds: Normal breath sounds.  Abdominal:     General: Abdomen is flat. Bowel sounds are normal.     Tenderness: There is no abdominal tenderness.  Lymphadenopathy:     Comments: Mild edema b/l upper arms  Negative legs b/l  Right inner eye with edema   Skin:    General: Skin is warm and dry.     Comments: +capillaritis legs b/l increasing upward per pt    Neurological:     General: No focal deficit present.     Mental Status: She is alert and oriented to person, place, and time. Mental status is at baseline.     Gait: Gait normal.  Psychiatric:        Attention and Perception: Attention and perception normal.        Mood and Affect: Mood and affect normal.        Speech: Speech normal.        Behavior: Behavior normal. Behavior is cooperative.        Thought Content: Thought content normal.        Cognition and Memory: Cognition and memory normal.        Judgment: Judgment normal.     Assessment  Plan  Leg edema - Plan: ECHOCARDIOGRAM COMPLETE r/o cardiac etiology  Mild edema b/l upper arms and right eye ? Etiology if echo neg consider spep/upep in future anca (c and p) but this is less likely etiology. Pt declines increased salt intake   Hyperlipidemia, unspecified hyperlipidemia type - Plan: atorvastatin (LIPITOR) 10 MG tablet d/c pravachol 80 mg qhs  Given cholesterol info   Osteoporosis, unspecified osteoporosis type, unspecified pathological fracture presence Declines meds to tx   Capillaritis pentoxifylline for capillaritis  PUVA/UVB Vitamin C 500 mg 2x per  day  Bioflavonoid Rutosiden 50 mg 2x per day    Eye swelling, right  F/u AE 11/2019   HM Flu shot utd 09/16/19 prevnar utd, consider pna 23 vaccine  Tdap had 08/16/16 Had zostervax  shingrix vaccine per pt had 2/2   Out of age window for pap s/p hysterectomy 1984 for fibroids 1 ovary intact  Mammogram neg 09/06/19 neg  DEXA 09/03/16 +osteoporosis was on oral bisphonates and had jaw necrosis declines other meds for now I.e prolia discussed prev. rec calcium 600 mg 1-2 x per day and D3 1000-2000 IU daily  Declines meds disc strontium supplement otc   Colonoscopy h/o polyps last [place] Dr. Candace Cruise diverticulosis 03/14/16 bxs negative will see if needed in 2022 h/o polyps pt does not know type   Dermatology Dr. Kellie Moor Aks right cheek LN2 saw 2019 f/u in 1 year 11/2019   Former smoker age [age] light smoker   Eye Dr. Merla Riches saw 11/27/18 sch 11/2019  Dentist Dr. Kendell Bane  GI Dr. Alice Reichert   Provider: Dr. Olivia Mackie McLean-Scocuzza-Internal Medicine

## 2019-10-01 NOTE — Patient Instructions (Addendum)
pentoxifylline for capillaritis  PUVA/UVB Vitamin C 500 mg 2x per day  Bioflavonoid Rutosiden 50 mg 2x per day    Strontium supplement osteoporosis   High Cholesterol  High cholesterol is a condition in which the blood has high levels of a white, waxy, fat-like substance (cholesterol). The human body needs small amounts of cholesterol. The liver makes all the cholesterol that the body needs. Extra (excess) cholesterol comes from the food that we eat. Cholesterol is carried from the liver by the blood through the blood vessels. If you have high cholesterol, deposits (plaques) may build up on the walls of your blood vessels (arteries). Plaques make the arteries narrower and stiffer. Cholesterol plaques increase your risk for heart attack and stroke. Work with your health care provider to keep your cholesterol levels in a healthy range. What increases the risk? This condition is more likely to develop in people who:  Eat foods that are high in animal fat (saturated fat) or cholesterol.  Are overweight.  Are not getting enough exercise.  Have a family history of high cholesterol. What are the signs or symptoms? There are no symptoms of this condition. How is this diagnosed? This condition may be diagnosed from the results of a blood test.  If you are older than age 40, your health care provider may check your cholesterol every 4-6 years.  You may be checked more often if you already have high cholesterol or other risk factors for heart disease. The blood test for cholesterol measures:  "Bad" cholesterol (LDL cholesterol). This is the main type of cholesterol that causes heart disease. The desired level for LDL is less than 100.  "Good" cholesterol (HDL cholesterol). This type helps to protect against heart disease by cleaning the arteries and carrying the LDL away. The desired level for HDL is 60 or higher.  Triglycerides. These are fats that the body can store or burn for energy. The  desired number for triglycerides is lower than 150.  Total cholesterol. This is a measure of the total amount of cholesterol in your blood, including LDL cholesterol, HDL cholesterol, and triglycerides. A healthy number is less than 200. How is this treated? This condition is treated with diet changes, lifestyle changes, and medicines. Diet changes  This may include eating more whole grains, fruits, vegetables, nuts, and fish.  This may also include cutting back on red meat and foods that have a lot of added sugar. Lifestyle changes  Changes may include getting at least 40 minutes of aerobic exercise 3 times a week. Aerobic exercises include walking, biking, and swimming. Aerobic exercise along with a healthy diet can help you maintain a healthy weight.  Changes may also include quitting smoking. Medicines  Medicines are usually given if diet and lifestyle changes have failed to reduce your cholesterol to healthy levels.  Your health care provider may prescribe a statin medicine. Statin medicines have been shown to reduce cholesterol, which can reduce the risk of heart disease. Follow these instructions at home: Eating and drinking If told by your health care provider:  Eat chicken (without skin), fish, veal, shellfish, ground [place] breast, and round or loin cuts of red meat.  Do not eat fried foods or fatty meats, such as hot dogs and salami.  Eat plenty of fruits, such as apples.  Eat plenty of vegetables, such as broccoli, potatoes, and carrots.  Eat beans, peas, and lentils.  Eat grains such as barley, rice, couscous, and bulgur wheat.  Eat pasta without cream  sauces.  Use skim or nonfat milk, and eat low-fat or nonfat yogurt and cheeses.  Do not eat or drink whole milk, cream, ice cream, egg yolks, or hard cheeses.  Do not eat stick margarine or tub margarines that contain trans fats (also called partially hydrogenated oils).  Do not eat saturated tropical oils, such  as coconut oil and palm oil.  Do not eat cakes, cookies, crackers, or other baked goods that contain trans fats.  General instructions  Exercise as directed by your health care provider. Increase your activity level with activities such as gardening, walking, and taking the stairs.  Take over-the-counter and prescription medicines only as told by your health care provider.  Do not use any products that contain nicotine or tobacco, such as cigarettes and e-cigarettes. If you need help quitting, ask your health care provider.  Keep all follow-up visits as told by your health care provider. This is important. Contact a health care provider if:  You are struggling to maintain a healthy diet or weight.  You need help to start on an exercise program.  You need help to stop smoking. Get help right away if:  You have chest pain.  You have trouble breathing. This information is not intended to replace advice given to you by your health care provider. Make sure you discuss any questions you have with your health care provider. Document Released: 11/25/2005 Document Revised: 11/28/2017 Document Reviewed: 05/25/2016 Elsevier Patient Education  [place].  Cholesterol Content in Foods Cholesterol is a waxy, fat-like substance that helps to carry fat in the blood. The body needs cholesterol in small amounts, but too much cholesterol can cause damage to the arteries and heart. Most people should eat less than 200 milligrams (mg) of cholesterol a day. Foods with cholesterol  Cholesterol is found in animal-based foods, such as meat, seafood, and dairy. Generally, low-fat dairy and lean meats have less cholesterol than full-fat dairy and fatty meats. The milligrams of cholesterol per serving (mg per serving) of common cholesterol-containing foods are listed below. Meat and other proteins  Egg - one large whole egg has 186 mg.  Veal shank - 4 oz has 141 mg.  Lean ground [place] (93%  lean) - 4 oz has 118 mg.  Fat-trimmed lamb loin - 4 oz has 106 mg.  Lean ground beef (90% lean) - 4 oz has 100 mg.  Lobster - 3.5 oz has 90 mg.  Pork loin chops - 4 oz has 86 mg.  Canned salmon - 3.5 oz has 83 mg.  Fat-trimmed beef top loin - 4 oz has 78 mg.  Frankfurter - 1 frank (3.5 oz) has 77 mg.  Crab - 3.5 oz has 71 mg.  Roasted chicken without skin, white meat - 4 oz has 66 mg.  Light bologna - 2 oz has 45 mg.  Deli-cut [place] - 2 oz has 31 mg.  Canned tuna - 3.5 oz has 31 mg.  Bacon - 1 oz has 29 mg.  Oysters and mussels (raw) - 3.5 oz has 25 mg.  Mackerel - 1 oz has 22 mg.  Trout - 1 oz has 20 mg.  Pork sausage - 1 link (1 oz) has 17 mg.  Salmon - 1 oz has 16 mg.  Tilapia - 1 oz has 14 mg. Dairy  Soft-serve ice cream -  cup (4 oz) has 103 mg.  Whole-milk yogurt - 1 cup (8 oz) has 29 mg.  Cheddar cheese - 1 oz has 28  mg.  American cheese - 1 oz has 28 mg.  Whole milk - 1 cup (8 oz) has 23 mg.  2% milk - 1 cup (8 oz) has 18 mg.  Cream cheese - 1 tablespoon (Tbsp) has 15 mg.  Cottage cheese -  cup (4 oz) has 14 mg.  Low-fat (1%) milk - 1 cup (8 oz) has 10 mg.  Sour cream - 1 Tbsp has 8.5 mg.  Low-fat yogurt - 1 cup (8 oz) has 8 mg.  Nonfat Greek yogurt - 1 cup (8 oz) has 7 mg.  Half-and-half cream - 1 Tbsp has 5 mg. Fats and oils  Cod liver oil - 1 tablespoon (Tbsp) has 82 mg.  Butter - 1 Tbsp has 15 mg.  Lard - 1 Tbsp has 14 mg.  Bacon grease - 1 Tbsp has 14 mg.  Mayonnaise - 1 Tbsp has 5-10 mg.  Margarine - 1 Tbsp has 3-10 mg. Exact amounts of cholesterol in these foods may vary depending on specific ingredients and brands. Foods without cholesterol Most plant-based foods do not have cholesterol unless you combine them with a food that has cholesterol. Foods without cholesterol include:  Grains and cereals.  Vegetables.  Fruits.  Vegetable oils, such as olive, canola, and sunflower oil.  Legumes, such as peas, beans,  and lentils.  Nuts and seeds.  Egg whites. Summary  The body needs cholesterol in small amounts, but too much cholesterol can cause damage to the arteries and heart.  Most people should eat less than 200 milligrams (mg) of cholesterol a day. This information is not intended to replace advice given to you by your health care provider. Make sure you discuss any questions you have with your health care provider. Document Released: 07/22/2017 Document Revised: 11/07/2017 Document Reviewed: 07/22/2017 Elsevier Patient Education  2020 [employer].

## 2019-10-07 ENCOUNTER — Encounter: Payer: Self-pay | Admitting: Internal Medicine

## 2019-10-07 ENCOUNTER — Other Ambulatory Visit: Payer: Self-pay | Admitting: Internal Medicine

## 2019-10-07 DIAGNOSIS — R600 Localized edema: Secondary | ICD-10-CM

## 2019-10-07 MED ORDER — FUROSEMIDE 20 MG PO TABS: 20.0000 mg | ORAL_TABLET | Freq: Every day | ORAL | 5 refills | Status: DC | PRN

## 2019-10-08 ENCOUNTER — Telehealth: Payer: Self-pay | Admitting: Internal Medicine

## 2019-10-08 NOTE — Telephone Encounter (Signed)
Left msg at [facility] to schedule pt appt.

## 2019-10-20 ENCOUNTER — Ambulatory Visit
Admission: RE | Admit: 2019-10-20 | Discharge: 2019-10-20 | Disposition: A | Discharge status: Home / Self Care | Payer: Medicare Other | Source: Ambulatory Visit | Attending: Internal Medicine | Admitting: Internal Medicine

## 2019-10-20 ENCOUNTER — Other Ambulatory Visit: Payer: Self-pay

## 2019-10-20 DIAGNOSIS — I050 Rheumatic mitral stenosis: Secondary | ICD-10-CM | POA: Diagnosis not present

## 2019-10-20 DIAGNOSIS — G4730 Sleep apnea, unspecified: Secondary | ICD-10-CM | POA: Insufficient documentation

## 2019-10-20 DIAGNOSIS — R600 Localized edema: Secondary | ICD-10-CM

## 2019-10-20 DIAGNOSIS — E785 Hyperlipidemia, unspecified: Secondary | ICD-10-CM | POA: Diagnosis not present

## 2019-10-20 NOTE — Progress Notes (Signed)
*  PRELIMINARY RESULTS* Echocardiogram 2D Echocardiogram has been performed.  Carol Stone 10/20/2019, 11:32 AM

## 2019-10-21 ENCOUNTER — Encounter: Payer: Self-pay | Admitting: Internal Medicine

## 2019-10-23 ENCOUNTER — Other Ambulatory Visit: Payer: Self-pay | Admitting: Internal Medicine

## 2019-10-23 DIAGNOSIS — I5189 Other ill-defined heart diseases: Secondary | ICD-10-CM

## 2019-10-23 DIAGNOSIS — I059 Rheumatic mitral valve disease, unspecified: Secondary | ICD-10-CM

## 2019-11-11 ENCOUNTER — Encounter: Payer: Self-pay | Admitting: Cardiology

## 2019-11-11 ENCOUNTER — Other Ambulatory Visit: Payer: Self-pay

## 2019-11-11 ENCOUNTER — Ambulatory Visit (INDEPENDENT_AMBULATORY_CARE_PROVIDER_SITE_OTHER): Payer: Medicare Other | Admitting: Cardiology

## 2019-11-11 VITALS — BP 140/80 | HR 107 | Ht 61.0 in | Wt 141.8 lb

## 2019-11-11 DIAGNOSIS — I050 Rheumatic mitral stenosis: Secondary | ICD-10-CM | POA: Diagnosis not present

## 2019-11-11 DIAGNOSIS — E785 Hyperlipidemia, unspecified: Secondary | ICD-10-CM | POA: Diagnosis not present

## 2019-11-11 DIAGNOSIS — I5189 Other ill-defined heart diseases: Secondary | ICD-10-CM | POA: Diagnosis not present

## 2019-11-11 DIAGNOSIS — R600 Localized edema: Secondary | ICD-10-CM | POA: Diagnosis not present

## 2019-11-11 NOTE — Patient Instructions (Signed)
Medication Instructions:  Your physician recommends that you continue on your current medications as directed. Please refer to the Current Medication list given to you today.  *If you need a refill on your cardiac medications before your next appointment, please call your pharmacy*  Lab Work: none If you have labs (blood work) drawn today and your tests are completely normal, you will receive your results only by: Marland Kitchen MyChart Message (if you have MyChart) OR . A paper copy in the mail If you have any lab test that is abnormal or we need to change your treatment, we will call you to review the results.  Testing/Procedures: Your physician has requested that you have an echocardiogram in 1 year prior to follow up appointment. Echocardiography is a painless test that uses sound waves to create images of your heart. It provides your doctor with information about the size and shape of your heart and how well your heart's chambers and valves are working. This procedure takes approximately one hour. There are no restrictions for this procedure. You may get an IV, if needed, to receive an ultrasound enhancing agent through to better visualize your heart.   Follow-Up: At [facility], you and your health needs are our priority.  As part of our continuing mission to provide you with exceptional heart care, we have created designated Provider Care Teams.  These Care Teams include your primary Cardiologist (physician) and Advanced Practice Providers (APPs -  Physician Assistants and Nurse Practitioners) who all work together to provide you with the care you need, when you need it.  Your next appointment:   12 month(s) Please make sure you've had the echo done prior to appointment.   The format for your next appointment:   In Person  Provider:    You may see Kate Sable, MD or one of the following Advanced Practice Providers on your designated Care Team:    Murray Hodgkins, NP  Christell Faith,  PA-C  Marrianne Mood, PA-C

## 2019-11-11 NOTE — Progress Notes (Signed)
Cardiology Office Note:    Date:  11/11/2019   ID:  Carol Stone, DOB 09-28-1946, MRN [id number]  PCP:  McLean-Scocuzza, Nino Glow, MD  Cardiologist:  Kate Sable, MD  Electrophysiologist:  None   Referring MD: McLean-Scocuzza, Olivia Mackie *   Chief Complaint  Patient presents with  . New Patient (Initial Visit)    F/U after echo; Meds verbally reviewed with patient.    History of Present Illness:    Carol Stone is a 73 y.o. female with a hx of hyperlipidemia, lower extremity edema who presents due to an abnormal echocardiogram.    Patient states having lower extremity edema for couple of months now.  Edema is usually worse towards the end of the day when she has been on her feet.  She also notes edema in her arms which are present when she wakes up.  She denies any chest pain or palpitations.  She however notes some shortness of breath when going uphill during her daily walks, but she usually finishes her walks..  She walks for about 3 miles daily.  She denies any history of cardiac disease, remote smoking history for 2 years as a teen.  Due to her lower extremity edema, an echocardiogram was ordered by her primary care physician.  Results as noted below, but briefly it showed grade 2 diastolic dysfunction, mild mitral stenosis with moderate mitral calcification.  Past Medical History:  Diagnosis Date  . Allergy   . Arthritis   . Cataract   . Frequent headaches    seasonal  . GERD (gastroesophageal reflux disease)   . History of chicken pox   . History of colon polyps   . Hypercholesteremia   . IBS (irritable bowel syndrome)    constipation   . OSA on CPAP   . Osteoporosis   . Sleep apnea   . Tinnitus   . Venous insufficiency     Past Surgical History:  Procedure Laterality Date  . ABDOMINAL HYSTERECTOMY    . COLONOSCOPY WITH PROPOFOL N/A 03/14/2016   Procedure: COLONOSCOPY WITH PROPOFOL;  Surgeon: Hulen Luster, MD;  Location: [facility] ENDOSCOPY;  Service:  Gastroenterology;  Laterality: N/A;  . DILATION AND CURETTAGE OF UTERUS    . TONSILLECTOMY      Current Medications: Current Meds  Medication Sig  . atorvastatin (LIPITOR) 10 MG tablet Take 1 tablet (10 mg total) by mouth daily at 6 PM.  . calcium-vitamin D (OSCAL WITH D) 500-200 MG-UNIT tablet Take 1 tablet by mouth.  . diclofenac sodium (VOLTAREN) 1 % GEL Apply topically 4 (four) times daily. Prn  . furosemide (LASIX) 20 MG tablet Take 1 tablet (20 mg total) by mouth daily as needed.  . metroNIDAZOLE (METROCREAM) 0.75 % cream APPLY TOPICALLY TO FACE TWICE A DAY  . Multiple Vitamin (MULTIVITAMIN) tablet Take 1 tablet by mouth daily.  . polyethylene glycol powder (GLYCOLAX/MIRALAX) powder Take by mouth.  . RESTASIS 0.05 % ophthalmic emulsion PLACE 1 DROP INTO BOTH EYES TWICE A DAY  . triamcinolone cream (KENALOG) 0.1 % Apply 1 application topically 2 (two) times daily. Prn legs b/l     Allergies:   Bactrim [sulfamethoxazole-trimethoprim], Codeine, Compazine [prochlorperazine edisylate], Demerol [meperidine], Indocin [indomethacin], Other, and Reglan [metoclopramide]   Social History   Socioeconomic History  . Marital status: Married    Spouse name: Not on file  . Number of children: Not on file  . Years of education: Not on file  . Highest education level: Not on file  Occupational History  . Not on file  Social Needs  . Financial resource strain: Not hard at all  . Food insecurity    Worry: Never true    Inability: Never true  . Transportation needs    Medical: No    Non-medical: No  Tobacco Use  . Smoking status: Former Research scientist (life sciences)  . Smokeless tobacco: Never Used  . Tobacco comment: age [age] light   Substance and Sexual Activity  . Alcohol use: No  . Drug use: No  . Sexual activity: Not Currently    Birth control/protection: Surgical  Lifestyle  . Physical activity    Days per week: Not on file    Minutes per session: Not on file  . Stress: Not on file   Relationships  . Social Herbalist on phone: Not on file    Gets together: Not on file    Attends religious service: Not on file    Active member of club or organization: Not on file    Attends meetings of clubs or organizations: Not on file    Relationship status: Not on file  Other Topics Concern  . Not on file  Social History Narrative   Married, husband in house    Wears seat belt, husband owns guns, safe in relationship    No kids    Masters, previous nurse office work    No kids    Retired      Family History: The patient's family history includes AAA (abdominal aortic aneurysm) in her paternal grandmother; Breast cancer (age of onset: 55) in her maternal aunt; Cancer in her father, maternal aunt, paternal grandfather, and sister; Deep vein thrombosis in her sister; Heart disease in her maternal grandmother; Hypertension in her father and paternal grandmother; Lung cancer in her sister; Osteoporosis in her mother; Prostate cancer in her father; Pulmonary embolism in her sister; Stroke in her mother.  ROS:   Please see the history of present illness.     All other systems reviewed and are negative.  EKGs/Labs/Other Studies Reviewed:    The following studies were reviewed today: TTE 11/19/2019  1. Left ventricular ejection fraction, by visual estimation, is 65 to 70%. The left ventricle has normal function. There is no left ventricular hypertrophy.  2. Left ventricular diastolic parameters are consistent with Grade II diastolic dysfunction (pseudonormalization).  3. Global right ventricle has normal systolic function.The right ventricular size is normal. No increase in right ventricular wall thickness.  4. Left atrial size was normal.  5. Right atrial size was normal.  6. Moderate calcification of the posterior mitral valve leaflet(s).  7. The mitral valve is degenerative. No evidence of mitral valve regurgitation. Mild mitral stenosis.  8. The tricuspid valve is  grossly normal. Tricuspid valve regurgitation is not demonstrated.  9. The aortic valve was not well visualized. Aortic valve regurgitation is not visualized. No evidence of aortic valve sclerosis or stenosis. 10. The pulmonic valve was not well visualized. Pulmonic valve regurgitation is not visualized. 11. The inferior vena cava is normal in size with greater than 50% respiratory variability, suggesting right atrial pressure of 3 mmHg.  EKG:  EKG is  ordered today.  The ekg ordered today demonstrates sinus tachycardia, heart rate 107, low voltage QRS.  Recent Labs: 02/19/2019: TSH 1.71 09/23/2019: ALT 15; BUN 19; Creatinine, Ser 0.90; Hemoglobin 13.7; Platelets 247.0; Potassium 4.0; Sodium 138  Recent Lipid Panel    Component Value Date/Time   CHOL 200 09/23/2019 0802  TRIG 194.0 (H) 09/23/2019 0802   HDL 61.00 09/23/2019 0802   CHOLHDL 3 09/23/2019 0802   VLDL 38.8 09/23/2019 0802   LDLCALC 100 (H) 09/23/2019 0802    Physical Exam:    VS:  BP 140/80 (BP Location: Right Arm, Patient Position: Sitting, Cuff Size: Normal)   Pulse (!) 107   Ht 5\' 1"  (1.549 m)   Wt 141 lb 12 oz (64.3 kg)   SpO2 98%   BMI 26.78 kg/m     Wt Readings from Last 3 Encounters:  11/11/19 141 lb 12 oz (64.3 kg)  10/01/19 141 lb 6.4 oz (64.1 kg)  06/14/19 139 lb 9.6 oz (63.3 kg)     GEN:  Well nourished, well developed in no acute distress HEENT: Normal NECK: No JVD; No carotid bruits LYMPHATICS: No lymphadenopathy CARDIAC: RRR, no murmurs, rubs, gallops RESPIRATORY:  Clear to auscultation without rales, wheezing or rhonchi  ABDOMEN: Soft, non-tender, non-distended MUSCULOSKELETAL:  No edema; No deformity  SKIN: Warm and dry NEUROLOGIC:  Alert and oriented x 3 PSYCHIATRIC:  Normal affect   ASSESSMENT:   Her lower extremity edema appears to be dependent and not due to diastolic dysfunction since edema typically occurs when she has been on her feet for long.  Her lungs are clear, cannot  appreciate any upper extremity edema.  Echocardiogram revealed normal ejection fraction with grade 2 diastolic dysfunction.  Mild mitral stenosis with mean gradient of 4 mmHg noted.  No LVH or characteristic infiltrative disease pattern noted on echo to suggest infiltrative disease in light of her EKG showing low voltage QRS.   1. Lower extremity edema   2. Diastolic dysfunction   3. Mitral valve stenosis, unspecified etiology   4. Hyperlipidemia, unspecified hyperlipidemia type    PLAN:    In order of problems listed above:  1. Leg raises, compression stockings advised. 2. Patient appears euvolemic, lungs are clear.  No indication for diuretics at this point. 3. Mild mitral valve stenosis.  Moderate calcification.  Repeat echocardiogram in 1 to 2 years. 4. Patient on Lipitor.  Management as per primary.  Titrate as needed.  Follow-up in 12 to 18 months.  Echocardiogram prior to follow-up  This note was generated in part or whole with voice recognition software. Voice recognition is usually quite accurate but there are transcription errors that can and very often do occur. I apologize for any typographical errors that were not detected and corrected.  Medication Adjustments/Labs and Tests Ordered: Current medicines are reviewed at length with the patient today.  Concerns regarding medicines are outlined above.  Orders Placed This Encounter  Procedures  . EKG 12-Lead  . ECHOCARDIOGRAM COMPLETE   No orders of the defined types were placed in this encounter.   Patient Instructions  Medication Instructions:  Your physician recommends that you continue on your current medications as directed. Please refer to the Current Medication list given to you today.  *If you need a refill on your cardiac medications before your next appointment, please call your pharmacy*  Lab Work: none If you have labs (blood work) drawn today and your tests are completely normal, you will receive your  results only by: Marland Kitchen MyChart Message (if you have MyChart) OR . A paper copy in the mail If you have any lab test that is abnormal or we need to change your treatment, we will call you to review the results.  Testing/Procedures: Your physician has requested that you have an echocardiogram in 1 year prior to  follow up appointment. Echocardiography is a painless test that uses sound waves to create images of your heart. It provides your doctor with information about the size and shape of your heart and how well your heart's chambers and valves are working. This procedure takes approximately one hour. There are no restrictions for this procedure. You may get an IV, if needed, to receive an ultrasound enhancing agent through to better visualize your heart.   Follow-Up: At [facility], you and your health needs are our priority.  As part of our continuing mission to provide you with exceptional heart care, we have created designated Provider Care Teams.  These Care Teams include your primary Cardiologist (physician) and Advanced Practice Providers (APPs -  Physician Assistants and Nurse Practitioners) who all work together to provide you with the care you need, when you need it.  Your next appointment:   12 month(s) Please make sure you've had the echo done prior to appointment.   The format for your next appointment:   In Person  Provider:    You may see Kate Sable, MD or one of the following Advanced Practice Providers on your designated Care Team:    Murray Hodgkins, NP  Christell Faith, PA-C  Marrianne Mood, PA-C      Signed, Kate Sable, MD  11/11/2019 9:26 AM    [place]

## 2020-01-10 ENCOUNTER — Other Ambulatory Visit: Payer: Self-pay

## 2020-01-10 ENCOUNTER — Telehealth: Payer: Self-pay | Admitting: Internal Medicine

## 2020-01-10 NOTE — Telephone Encounter (Signed)
For rash she can call dermatology she is established with them  Soreness Tylenol as needed  Heating pad  Other the counter antihistamine if needed for rash if itching   TMS

## 2020-01-10 NOTE — Telephone Encounter (Signed)
Patient called this morning and wanted to change her telephone visit on Thursday, 01/13/2020. Patient still has rash and now has moved to upper body. Patient stated she had a covid vaccine 2 weeks ago, sore at injection sight and some symptoms. Symptoms have returned two weeks later. Patient was advised she could not come into office because of rash and having some symptoms, patient was informed because of rash and symptoms she would not be able to come into office, patient was very unhappy.

## 2020-01-12 NOTE — Telephone Encounter (Signed)
Called Pt back with information from Dr. Mclean-Scocucca, [place] stated it is not a Dermatology issue. Pt stated the capillaries are leaking and the rash is moving up her legs. Pt stated  she will discuss on her visit 01/13/2020 with Dr. Mclean-Scocucca,

## 2020-01-13 ENCOUNTER — Encounter: Payer: Self-pay | Admitting: Internal Medicine

## 2020-01-13 ENCOUNTER — Other Ambulatory Visit: Payer: Self-pay

## 2020-01-13 ENCOUNTER — Ambulatory Visit (INDEPENDENT_AMBULATORY_CARE_PROVIDER_SITE_OTHER): Payer: Medicare PPO | Admitting: Internal Medicine

## 2020-01-13 VITALS — Ht 60.0 in | Wt 136.0 lb

## 2020-01-13 DIAGNOSIS — T50Z95A Adverse effect of other vaccines and biological substances, initial encounter: Secondary | ICD-10-CM

## 2020-01-13 DIAGNOSIS — T50Z95D Adverse effect of other vaccines and biological substances, subsequent encounter: Secondary | ICD-10-CM | POA: Diagnosis not present

## 2020-01-13 DIAGNOSIS — R600 Localized edema: Secondary | ICD-10-CM | POA: Insufficient documentation

## 2020-01-13 DIAGNOSIS — S3210XD Unspecified fracture of sacrum, subsequent encounter for fracture with routine healing: Secondary | ICD-10-CM

## 2020-01-13 DIAGNOSIS — M79622 Pain in left upper arm: Secondary | ICD-10-CM

## 2020-01-13 DIAGNOSIS — S322XXD Fracture of coccyx, subsequent encounter for fracture with routine healing: Secondary | ICD-10-CM

## 2020-01-13 HISTORY — DX: Adverse effect of other vaccines and biological substances, initial encounter: T50.Z95A

## 2020-01-13 HISTORY — DX: Localized edema: R60.0

## 2020-01-13 MED ORDER — FUROSEMIDE 40 MG PO TABS: 40.0000 mg | ORAL_TABLET | Freq: Every day | ORAL | 3 refills | Status: DC | PRN

## 2020-01-13 NOTE — Progress Notes (Signed)
Virtual Visit via Video Note  I connected with Carol Stone  on 01/13/20 at 10:50 AM EST by a video enabled telemedicine application and verified that I am speaking with the correct person using two identifiers.  Location patient: home Location provider:work or home office Persons participating in the virtual visit: patient, provider  I discussed the limitations of evaluation and management by telemedicine and the availability of in person appointments. The patient expressed understanding and agreed to proceed.   HPI: 1. moderna 1/2 shots reaction 12/21/19 she had soreness w/in 24 hours and 2 weeks later severe arm pain in left arm 1-2 weeks out and worse at night and wakes her up from sleep pain can range 3-4 to 9-10/10 and causing reduced sleep. She spoke with [facility]ist at [facility] who rec she get the 2nd dose in right arm and [facility] will report to [facility] She has normal ROM, arm is not red or swollen. She has tried Ibuprofen, Tylenol, heat and Voltaren w/o relief and ? If nerve related pain per pt   2. C/o leg edema near ankles and knees legs go up and down has tried knee highs and lasix 20 mg qd prn but swelling still present she is drinking 80 ounces of water daily    ROS: See pertinent positives and negatives per HPI.  Past Medical History:  Diagnosis Date  . Allergy   . Arthritis   . Cataract   . Frequent headaches    seasonal  . GERD (gastroesophageal reflux disease)   . History of chicken pox   . History of colon polyps   . Hypercholesteremia   . IBS (irritable bowel syndrome)    constipation   . OSA on CPAP   . Osteoporosis   . Sleep apnea   . Tinnitus   . Venous insufficiency     Past Surgical History:  Procedure Laterality Date  . ABDOMINAL HYSTERECTOMY    . COLONOSCOPY WITH PROPOFOL N/A 03/14/2016   Procedure: COLONOSCOPY WITH PROPOFOL;  Surgeon: Hulen Luster, MD;  Location: [facility] ENDOSCOPY;  Service: Gastroenterology;  Laterality: N/A;  . DILATION AND CURETTAGE OF  UTERUS    . TONSILLECTOMY      Family History  Problem Relation Age of Onset  . Prostate cancer Father   . Cancer Father        prostate   . Hypertension Father   . Lung cancer Sister   . Cancer Sister        lung smoker   . Deep vein thrombosis Sister   . Pulmonary embolism Sister   . Breast cancer Maternal Aunt 52  . Cancer Maternal Aunt        breast  . Stroke Mother   . Osteoporosis Mother   . Heart disease Maternal Grandmother        CHF  . AAA (abdominal aortic aneurysm) Paternal Grandmother   . Hypertension Paternal Grandmother   . Cancer Paternal Grandfather        prostate    SOCIAL HX:  Married, husband in house  Wears seat belt, husband owns guns, safe in relationship  No kids  Masters, previous nurse office work  No kids  Retired    Current Outpatient Medications:  .  atorvastatin (LIPITOR) 10 MG tablet, Take 1 tablet (10 mg total) by mouth daily at 6 PM., Disp: 90 tablet, Rfl: 3 .  calcium-vitamin D (OSCAL WITH D) 500-200 MG-UNIT tablet, Take 1 tablet by mouth., Disp: , Rfl:  .  diclofenac sodium (VOLTAREN) 1 % GEL, Apply topically 4 (four) times daily. Prn, Disp: , Rfl:  .  furosemide (LASIX) 40 MG tablet, Take 1 tablet (40 mg total) by mouth daily as needed. In am, Disp: 90 tablet, Rfl: 3 .  metroNIDAZOLE (METROCREAM) 0.75 % cream, APPLY TOPICALLY TO FACE TWICE A DAY, Disp: , Rfl:  .  Multiple Vitamin (MULTIVITAMIN) tablet, Take 1 tablet by mouth daily., Disp: , Rfl:  .  polyethylene glycol powder (GLYCOLAX/MIRALAX) powder, Take by mouth., Disp: , Rfl:  .  RESTASIS 0.05 % ophthalmic emulsion, PLACE 1 DROP INTO BOTH EYES TWICE A DAY, Disp: , Rfl: 2 .  triamcinolone cream (KENALOG) 0.1 %, Apply 1 application topically 2 (two) times daily. Prn legs b/l, Disp: 454 g, Rfl: 0  EXAM:  VITALS per patient if applicable:  GENERAL: alert, oriented, appears well and in no acute distress  HEENT: atraumatic, conjunttiva clear, no obvious abnormalities on  inspection of external nose and ears  NECK: normal movements of the head and neck  LUNGS: on inspection no signs of respiratory distress, breathing rate appears normal, no obvious gross SOB, gasping or wheezing  CV: no obvious cyanosis  MS: moves all visible extremities without noticeable abnormality  PSYCH/NEURO: pleasant and cooperative, no obvious depression or anxiety, speech and thought processing grossly intact  ASSESSMENT AND PLAN:  Discussed the following assessment and plan:  Adverse effect of vaccine moderna vaccine had 12/21/19 with severe left arm soreness  -rec salonpas or lidocaine otc in addition to other meds otc  If not better let me know next week   Leg edema - Plan: furosemide (LASIX) 40 MG tablet increase from 20 mg qd 10/20/2019  Heart pump function normal  Grade 2 diastolic dysfunction or trouble relaxing  Mitral valve degeneration and narrowing I would rec low salt diet and reduce fluid total intake to 1.5 to no more than 2 liters in 1 day  rec reduce fluids to 50-64 ounces daily  HM Flu shot utd 09/16/19 prevnar utd, consider pna 23 vaccine  Tdap had 08/16/16 Had zostervax  shingrix vaccine per pt had 2/2   Out of age window for pap s/p hysterectomy 1984 for fibroids 1 ovary intact  Mammogram neg 09/06/19 neg DEXA 09/03/16 +osteoporosis was on oral bisphonates and had jaw necrosis declines other meds for now I.e prolia discussedprev. rec calcium 600 mg 1-2 x per day and D3 1000-2000 IU daily  Declines meds disc strontium supplement otc   Colonoscopy h/o polyps last [facility] Dr. Candace Cruise diverticulosis 03/14/16 bxs negativewill see if needed in 2022 h/o polyps pt does not know type  Dermatology Dr. Kellie Moor Aks right cheek LN2 saw 2019 f/u in 1 year 11/2019   Former smoker age [age] light smoker   Eye Dr. Merla Riches saw 11/27/18 sch 11/2019  Dentist Dr. Kendell Bane  GI Dr. Alice Reichert   -we discussed possible serious and likely etiologies, options for  evaluation and workup, limitations of telemedicine visit vs in person visit, treatment, treatment risks and precautions. Pt prefers to treat via telemedicine empirically rather then risking or undertaking an in person visit at this moment. Patient agrees to seek prompt in person care if worsening, new symptoms arise, or if is not improving with treatment.   I discussed the assessment and treatment plan with the patient. The patient was provided an opportunity to ask questions and all were answered. The patient agreed with the plan and demonstrated an understanding of the instructions.   The patient was  advised to call back or seek an in-person evaluation if the symptoms worsen or if the condition fails to improve as anticipated.  Time spent 20 minutes  Delorise Jackson, MD

## 2020-01-21 ENCOUNTER — Encounter: Payer: Self-pay | Admitting: Internal Medicine

## 2020-01-24 ENCOUNTER — Encounter: Payer: Self-pay | Admitting: Internal Medicine

## 2020-02-10 ENCOUNTER — Encounter: Payer: Self-pay | Admitting: Internal Medicine

## 2020-02-23 ENCOUNTER — Encounter: Payer: Self-pay | Admitting: Internal Medicine

## 2020-02-24 ENCOUNTER — Other Ambulatory Visit: Payer: Self-pay | Admitting: Internal Medicine

## 2020-02-24 DIAGNOSIS — G8929 Other chronic pain: Secondary | ICD-10-CM

## 2020-02-24 DIAGNOSIS — M25512 Pain in left shoulder: Secondary | ICD-10-CM

## 2020-02-25 ENCOUNTER — Ambulatory Visit
Admission: RE | Admit: 2020-02-25 | Discharge: 2020-02-25 | Disposition: A | Discharge status: Home / Self Care | Payer: Medicare PPO | Source: Ambulatory Visit | Attending: Internal Medicine | Admitting: Internal Medicine

## 2020-02-25 DIAGNOSIS — G8929 Other chronic pain: Secondary | ICD-10-CM | POA: Insufficient documentation

## 2020-02-25 DIAGNOSIS — M25512 Pain in left shoulder: Secondary | ICD-10-CM | POA: Diagnosis not present

## 2020-03-15 DIAGNOSIS — M778 Other enthesopathies, not elsewhere classified: Secondary | ICD-10-CM | POA: Diagnosis not present

## 2020-03-15 DIAGNOSIS — M722 Plantar fascial fibromatosis: Secondary | ICD-10-CM | POA: Diagnosis not present

## 2020-04-04 ENCOUNTER — Encounter: Payer: Self-pay | Admitting: Internal Medicine

## 2020-04-06 ENCOUNTER — Ambulatory Visit (INDEPENDENT_AMBULATORY_CARE_PROVIDER_SITE_OTHER): Payer: Medicare PPO

## 2020-04-06 ENCOUNTER — Encounter: Payer: Self-pay | Admitting: Internal Medicine

## 2020-04-06 ENCOUNTER — Ambulatory Visit: Payer: Medicare PPO

## 2020-04-06 ENCOUNTER — Other Ambulatory Visit: Payer: Medicare PPO

## 2020-04-06 ENCOUNTER — Other Ambulatory Visit: Payer: Self-pay

## 2020-04-06 ENCOUNTER — Ambulatory Visit (INDEPENDENT_AMBULATORY_CARE_PROVIDER_SITE_OTHER): Payer: Medicare PPO | Admitting: Internal Medicine

## 2020-04-06 VITALS — BP 130/90 | HR 95 | Temp 97.1°F | Ht 60.0 in | Wt 138.4 lb

## 2020-04-06 DIAGNOSIS — R030 Elevated blood-pressure reading, without diagnosis of hypertension: Secondary | ICD-10-CM

## 2020-04-06 DIAGNOSIS — E785 Hyperlipidemia, unspecified: Secondary | ICD-10-CM | POA: Diagnosis not present

## 2020-04-06 DIAGNOSIS — M533 Sacrococcygeal disorders, not elsewhere classified: Secondary | ICD-10-CM

## 2020-04-06 DIAGNOSIS — M545 Low back pain, unspecified: Secondary | ICD-10-CM

## 2020-04-06 DIAGNOSIS — M25551 Pain in right hip: Secondary | ICD-10-CM | POA: Diagnosis not present

## 2020-04-06 DIAGNOSIS — S79912A Unspecified injury of left hip, initial encounter: Secondary | ICD-10-CM | POA: Diagnosis not present

## 2020-04-06 DIAGNOSIS — M25552 Pain in left hip: Secondary | ICD-10-CM | POA: Diagnosis not present

## 2020-04-06 DIAGNOSIS — R269 Unspecified abnormalities of gait and mobility: Secondary | ICD-10-CM | POA: Diagnosis not present

## 2020-04-06 DIAGNOSIS — S79911A Unspecified injury of right hip, initial encounter: Secondary | ICD-10-CM | POA: Diagnosis not present

## 2020-04-06 DIAGNOSIS — Z1389 Encounter for screening for other disorder: Secondary | ICD-10-CM

## 2020-04-06 DIAGNOSIS — Z1329 Encounter for screening for other suspected endocrine disorder: Secondary | ICD-10-CM

## 2020-04-06 NOTE — Progress Notes (Signed)
Chief Complaint  Patient presents with  . Back Pain    In the lower back into the sacral region. States that it is 3/10 pain with normal movement and increases to 6-7/10 when bending or standing on her left leg  . Fall    Had a recent fall 2 weeks ago and landed on her tail bone   Fall f/u  2 weeks ago getting up from couch and had fall on lower back and tailbone w/o loc standing for long periods of time makes back pain worse and standing on left leg she is sore but better pain was 7-8/10 initially now 3/10. She is unable to walk long distances due to pain and squatting hurts and bending at the knee hurts at the hip or having left leg outstretched. Tried Tylenol, voltaren, ibuprofen and lidocaine with slight relief. Her tailbone is also hurting  Prior to fall she does report R>L hip pain   Left upper arm pain better after covid vx dose took 2.5 months to resolved     Review of Systems  Constitutional: Negative for weight loss.  HENT: Negative for hearing loss.   Respiratory: Negative for shortness of breath.   Cardiovascular: Negative for chest pain.  Musculoskeletal: Positive for back pain, falls and joint pain.  Psychiatric/Behavioral: Negative for memory loss.   Past Medical History:  Diagnosis Date  . Allergy   . Arthritis   . Cataract   . Frequent headaches    seasonal  . GERD (gastroesophageal reflux disease)   . History of chicken pox   . History of colon polyps   . Hypercholesteremia   . IBS (irritable bowel syndrome)    constipation   . OSA on CPAP   . Osteoporosis   . Sleep apnea   . Tinnitus   . Venous insufficiency    Past Surgical History:  Procedure Laterality Date  . ABDOMINAL HYSTERECTOMY    . COLONOSCOPY WITH PROPOFOL N/A 03/14/2016   Procedure: COLONOSCOPY WITH PROPOFOL;  Surgeon: Hulen Luster, MD;  Location: [facility] ENDOSCOPY;  Service: Gastroenterology;  Laterality: N/A;  . DILATION AND CURETTAGE OF UTERUS    . TONSILLECTOMY     Family History  Problem  Relation Age of Onset  . Prostate cancer Father   . Cancer Father        prostate   . Hypertension Father   . Lung cancer Sister   . Cancer Sister        lung smoker   . Deep vein thrombosis Sister   . Pulmonary embolism Sister   . Breast cancer Maternal Aunt 52  . Cancer Maternal Aunt        breast  . Stroke Mother   . Osteoporosis Mother   . Heart disease Maternal Grandmother        CHF  . AAA (abdominal aortic aneurysm) Paternal Grandmother   . Hypertension Paternal Grandmother   . Cancer Paternal Grandfather        prostate   Social History   Socioeconomic History  . Marital status: Married    Spouse name: Not on file  . Number of children: Not on file  . Years of education: Not on file  . Highest education level: Not on file  Occupational History  . Not on file  Tobacco Use  . Smoking status: Former Research scientist (life sciences)  . Smokeless tobacco: Never Used  . Tobacco comment: age [age] light   Substance and Sexual Activity  . Alcohol use: No  .  Drug use: No  . Sexual activity: Not Currently    Birth control/protection: Surgical  Other Topics Concern  . Not on file  Social History Narrative   Married, husband in house    Wears seat belt, husband owns guns, safe in relationship    No kids    Masters, previous nurse office work    No kids    Retired    Scientist, physiological Strain: [place]   . Difficulty of Paying Living Expenses: Not hard at all  Food Insecurity: No Food Insecurity  . Worried About Charity fundraiser in the Last Year: Never true  . Ran Out of Food in the Last Year: Never true  Transportation Needs: No Transportation Needs  . Lack of Transportation (Medical): No  . Lack of Transportation (Non-Medical): No  Physical Activity:   . Days of Exercise per Week:   . Minutes of Exercise per Session:   Stress:   . Feeling of Stress :   Social Connections:   . Frequency of Communication with Friends and Family:   . Frequency of  Social Gatherings with Friends and Family:   . Attends Religious Services:   . Active Member of Clubs or Organizations:   . Attends Archivist Meetings:   Marland Kitchen Marital Status:   Intimate Partner Violence: Not At Risk  . Fear of Current or Ex-Partner: No  . Emotionally Abused: No  . Physically Abused: No  . Sexually Abused: No   Current Meds  Medication Sig  . atorvastatin (LIPITOR) 10 MG tablet Take 1 tablet (10 mg total) by mouth daily at 6 PM.  . calcium-vitamin D (OSCAL WITH D) 500-200 MG-UNIT tablet Take 1 tablet by mouth.  . diclofenac sodium (VOLTAREN) 1 % GEL Apply topically 4 (four) times daily. Prn  . furosemide (LASIX) 40 MG tablet Take 1 tablet (40 mg total) by mouth daily as needed. In am  . Multiple Vitamin (MULTIVITAMIN) tablet Take 1 tablet by mouth daily.  . RESTASIS 0.05 % ophthalmic emulsion PLACE 1 DROP INTO BOTH EYES TWICE A DAY   Allergies  Allergen Reactions  . Bactrim [Sulfamethoxazole-Trimethoprim] Hives  . Codeine Other (See Comments)    Nervous system reaction insides coming out through skin  . Compazine [Prochlorperazine Edisylate] Other (See Comments)  . Demerol [Meperidine] Other (See Comments)    Nervous system reaction insides coming out through skin  . Indocin [Indomethacin] Other (See Comments)  . Other     Onions and garlic    . Reglan [Metoclopramide] Other (See Comments)   No results found for this or any previous visit (from the past 2160 hour(s)). Objective  Body mass index is 27.03 kg/m. Wt Readings from Last 3 Encounters:  04/06/20 138 lb 6.4 oz (62.8 kg)  01/13/20 136 lb (61.7 kg)  11/11/19 141 lb 12 oz (64.3 kg)   Temp Readings from Last 3 Encounters:  04/06/20 (!) 97.1 F (36.2 C) (Temporal)  10/01/19 97.6 F (36.4 C) (Oral)  06/14/19 97.8 F (36.6 C) (Temporal)   BP Readings from Last 3 Encounters:  04/06/20 130/90  11/11/19 140/80  10/01/19 132/78   Pulse Readings from Last 3 Encounters:  04/06/20 95   11/11/19 (!) 107  10/01/19 (!) 103    Physical Exam Vitals and nursing note reviewed.  Constitutional:      Appearance: Normal appearance. She is well-developed, well-groomed and overweight.  HENT:     Head: Normocephalic and atraumatic.  Eyes:     Conjunctiva/sclera: Conjunctivae normal.     Pupils: Pupils are equal, round, and reactive to light.  Cardiovascular:     Rate and Rhythm: Normal rate and regular rhythm.     Heart sounds: Normal heart sounds. No murmur.  Pulmonary:     Effort: Pulmonary effort is normal.     Breath sounds: Normal breath sounds.  Musculoskeletal:     Lumbar back: Tenderness present. Positive left straight leg raise test.       Back:  Skin:    General: Skin is warm and dry.  Neurological:     General: No focal deficit present.     Mental Status: She is alert and oriented to person, place, and time. Mental status is at baseline.     Gait: Gait normal.  Psychiatric:        Attention and Perception: Attention and perception normal.        Mood and Affect: Mood and affect normal.        Speech: Speech normal.        Behavior: Behavior normal. Behavior is cooperative.        Thought Content: Thought content normal.        Cognition and Memory: Cognition and memory normal.        Judgment: Judgment normal.     Assessment  Plan  Acute left-sided low back pain, unspecified whether sciatica present - Plan: DG Lumbar Spine Complete, DG Sacrum/Coccyx, DG HIPS BILAT WITH PELVIS 3-4 VIEWS  Bilateral hip pain - Plan: DG Lumbar Spine Complete, DG Sacrum/Coccyx, DG HIPS BILAT WITH PELVIS 3-4 VIEWS  Coccyx pain - Plan: DG Lumbar Spine Complete, DG Sacrum/Coccyx, DG HIPS BILAT WITH PELVIS 3-4 VIEWS  Abnormal gait/fall 2 weeks ago causing above  Given exercises to do  Cont otc pain meds tylenol nsaids  Declines narcotics, muscle relaxers  Xrays today  F/u as sch in 3 months  Fasting labs before   Provider: Dr. Olivia Mackie McLean-Scocuzza-Internal Medicine

## 2020-04-06 NOTE — Patient Instructions (Addendum)
Hip Exercises Ask your health care provider which exercises are safe for you. Do exercises exactly as told by your health care provider and adjust them as directed. It is normal to feel mild stretching, pulling, tightness, or discomfort as you do these exercises. Stop right away if you feel sudden pain or your pain gets worse. Do not begin these exercises until told by your health care provider. Stretching and range-of-motion exercises These exercises warm up your muscles and joints and improve the movement and flexibility of your hip. These exercises also help to relieve pain, numbness, and tingling. You may be asked to limit your range of motion if you had a hip replacement. Talk to your health care provider about these restrictions. Hamstrings, supine  1. Lie on your back (supine position). 2. Loop a belt or towel over the ball of your left / right foot. The ball of your foot is on the walking surface, right under your toes. 3. Straighten your left / right knee and slowly pull on the belt or towel to raise your leg until you feel a gentle stretch behind your knee (hamstring). ? Do not let your knee bend while you do this. ? Keep your other leg flat on the floor. 4. Hold this position for __________ seconds. 5. Slowly return your leg to the starting position. Repeat __________ times. Complete this exercise __________ times a day. Hip rotation  1. Lie on your back on a firm surface. 2. With your left / right hand, gently pull your left / right knee toward the shoulder that is on the same side of the body. Stop when your knee is pointing toward the ceiling. 3. Hold your left / right ankle with your other hand. 4. Keeping your knee steady, gently pull your left / right ankle toward your other shoulder until you feel a stretch in your buttocks. ? Keep your hips and shoulders firmly planted while you do this stretch. 5. Hold this position for __________ seconds. Repeat __________ times. Complete  this exercise __________ times a day. Seated stretch This exercise is sometimes called hamstrings and adductors stretch. 1. Sit on the floor with your legs stretched wide. Keep your knees straight during this exercise. 2. Keeping your head and back in a straight line, bend at your waist to reach for your left foot (position A). You should feel a stretch in your right inner thigh (adductors). 3. Hold this position for __________ seconds. Then slowly return to the upright position. 4. Keeping your head and back in a straight line, bend at your waist to reach forward (position B). You should feel a stretch behind both of your thighs and knees (hamstrings). 5. Hold this position for __________ seconds. Then slowly return to the upright position. 6. Keeping your head and back in a straight line, bend at your waist to reach for your right foot (position C). You should feel a stretch in your left inner thigh (adductors). 7. Hold this position for __________ seconds. Then slowly return to the upright position. Repeat __________ times. Complete this exercise __________ times a day. Lunge This exercise stretches the muscles of the hip (hip flexors). 1. Place your left / right knee on the floor and bend your other knee so that is directly over your ankle. You should be half-kneeling. 2. Keep good posture with your head over your shoulders. 3. Tighten your buttocks to point your tailbone downward. This will prevent your back from arching too much. 4. You should feel a  gentle stretch in the front of your left / right thigh and hip. If you do not feel a stretch, slide your other foot forward slightly and then slowly lunge forward with your chest up until your knee once again lines up over your ankle. ? Make sure your tailbone continues to point downward. 5. Hold this position for __________ seconds. 6. Slowly return to the starting position. Repeat __________ times. Complete this exercise __________ times a  day. Strengthening exercises These exercises build strength and endurance in your hip. Endurance is the ability to use your muscles for a long time, even after they get tired. Bridge This exercise strengthens the muscles of your hip (hip extensors). 1. Lie on your back on a firm surface with your knees bent and your feet flat on the floor. 2. Tighten your buttocks muscles and lift your bottom off the floor until the trunk of your body and your hips are level with your thighs. ? Do not arch your back. ? You should feel the muscles working in your buttocks and the back of your thighs. If you do not feel these muscles, slide your feet 1-2 inches (2.5-5 cm) farther away from your buttocks. 3. Hold this position for __________ seconds. 4. Slowly lower your hips to the starting position. 5. Let your muscles relax completely between repetitions. Repeat __________ times. Complete this exercise __________ times a day. Straight leg raises, side-lying This exercise strengthens the muscles that move the hip joint away from the center of the body (hip abductors). 1. Lie on your side with your left / right leg in the top position. Lie so your head, shoulder, hip, and knee line up. You may bend your bottom knee slightly to help you balance. 2. Roll your hips slightly forward, so your hips are stacked directly over each other and your left / right knee is facing forward. 3. Leading with your heel, lift your top leg 4-6 inches (10-15 cm). You should feel the muscles in your top hip lifting. ? Do not let your foot drift forward. ? Do not let your knee roll toward the ceiling. 4. Hold this position for __________ seconds. 5. Slowly return to the starting position. 6. Let your muscles relax completely between repetitions. Repeat __________ times. Complete this exercise __________ times a day. Straight leg raises, side-lying This exercise strengthens the muscles that move the hip joint toward the center of the  body (hip adductors). 1. Lie on your side with your left / right leg in the bottom position. Lie so your head, shoulder, hip, and knee line up. You may place your upper foot in front to help you balance. 2. Roll your hips slightly forward, so your hips are stacked directly over each other and your left / right knee is facing forward. 3. Tense the muscles in your inner thigh and lift your bottom leg 4-6 inches (10-15 cm). 4. Hold this position for __________ seconds. 5. Slowly return to the starting position. 6. Let your muscles relax completely between repetitions. Repeat __________ times. Complete this exercise __________ times a day. Straight leg raises, supine This exercise strengthens the muscles in the front of your thigh (quadriceps). 1. Lie on your back (supine position) with your left / right leg extended and your other knee bent. 2. Tense the muscles in the front of your left / right thigh. You should see your kneecap slide up or see increased dimpling just above your knee. 3. Keep these muscles tight as you raise your  leg 4-6 inches (10-15 cm) off the floor. Do not let your knee bend. 4. Hold this position for __________ seconds. 5. Keep these muscles tense as you lower your leg. 6. Relax the muscles slowly and completely between repetitions. Repeat __________ times. Complete this exercise __________ times a day. Hip abductors, standing This exercise strengthens the muscles that move the leg and hip joint away from the center of the body (hip abductors). 1. Tie one end of a rubber exercise band or tubing to a secure surface, such as a chair, table, or pole. 2. Loop the other end of the band or tubing around your left / right ankle. 3. Keeping your ankle with the band or tubing directly opposite the secured end, step away until there is tension in the tubing or band. Hold on to a chair, table, or pole as needed for balance. 4. Lift your left / right leg out to your side. While you do  this: ? Keep your back upright. ? Keep your shoulders over your hips. ? Keep your toes pointing forward. ? Make sure to use your hip muscles to slowly lift your leg. Do not tip your body or forcefully lift your leg. 5. Hold this position for __________ seconds. 6. Slowly return to the starting position. Repeat __________ times. Complete this exercise __________ times a day. Squats This exercise strengthens the muscles in the front of your thigh (quadriceps). 1. Stand in a door frame so your feet and knees are in line with the frame. You may place your hands on the frame for balance. 2. Slowly bend your knees and lower your hips like you are going to sit in a chair. ? Keep your lower legs in a straight-up-and-down position. ? Do not let your hips go lower than your knees. ? Do not bend your knees lower than told by your health care provider. ? If your hip pain increases, do not bend as low. 3. Hold this position for ___________ seconds. 4. Slowly push with your legs to return to standing. Do not use your hands to pull yourself to standing. Repeat __________ times. Complete this exercise __________ times a day. This information is not intended to replace advice given to you by your health care provider. Make sure you discuss any questions you have with your health care provider. Document Revised: 07/01/2019 Document Reviewed: 10/06/2018 Elsevier Patient Education  2020 [employer].  Thoracic Strain Rehab Ask your health care provider which exercises are safe for you. Do exercises exactly as told by your health care provider and adjust them as directed. It is normal to feel mild stretching, pulling, tightness, or discomfort as you do these exercises. Stop right away if you feel sudden pain or your pain gets worse. Do not begin these exercises until told by your health care provider. Stretching and range-of-motion exercise This exercise warms up your muscles and joints and improves the  movement and flexibility of your back and shoulders. This exercise also helps to relieve pain. Chest and spine stretch  6. Lie down on your back on a firm surface. 7. Roll a towel or a small blanket so it is about 4 inches (10 cm) in diameter. 8. Put the towel lengthwise under the middle of your back so it is under your spine, but not under your shoulder blades. 9. Put your hands behind your head and let your elbows fall to your sides. This will increase your stretch. 10. Take a deep breath (inhale). 11. Hold for __________  seconds. 12. Relax after you breathe out (exhale). Repeat __________ times. Complete this exercise __________ times a day. Strengthening exercises These exercises build strength and endurance in your back and your shoulder blade muscles. Endurance is the ability to use your muscles for a long time, even after they get tired. Alternating arm and leg raises  6. Get on your hands and knees on a firm surface. If you are on a hard floor, you may want to use padding, such as an exercise mat, to cushion your knees. 7. Line up your arms and legs. Your hands should be directly below your shoulders, and your knees should be directly below your hips. 8. Lift your left leg behind you. At the same time, raise your right arm and straighten it in front of you. ? Do not lift your leg higher than your hip. ? Do not lift your arm higher than your shoulder. ? Keep your abdominal and back muscles tight. ? Keep your hips facing the ground. ? Do not arch your back. ? Keep your balance carefully, and do not hold your breath. 9. Hold for __________ seconds. 10. Slowly return to the starting position and repeat with your right leg and your left arm. Repeat __________ times. Complete this exercise __________ times a day. Straight arm rows This exercise is also called shoulder extension exercise. 8. Stand with your feet shoulder width apart. 9. Secure an exercise band to a stable object in  front of you so the band is at or above shoulder height. 10. Hold one end of the exercise band in each hand. 11. Straighten your elbows and lift your hands up to shoulder height. 12. Step back, away from the secured end of the exercise band, until the band stretches. 13. Squeeze your shoulder blades together and pull your hands down to the sides of your thighs. Stop when your hands are straight down by your sides. This is shoulder extension. Do not let your hands go behind your body. 14. Hold for __________ seconds. 15. Slowly return to the starting position. Repeat __________ times. Complete this exercise __________ times a day. Prone shoulder external rotation 7. Lie on your abdomen on a firm bed so your left / right forearm hangs over the edge of the bed and your upper arm is on the bed, straight out from your body. This is the prone position. ? Your elbow should be bent. ? Your palm should be facing your feet. 8. If instructed, hold a __________ weight in your hand. 9. Squeeze your shoulder blade toward the middle of your back. Do not let your shoulder lift toward your ear. 10. Keep your elbow bent in a 90-degree angle (right angle) while you slowly move your forearm up toward the ceiling. Move your forearm up to the height of the bed, toward your head. This is external rotation. ? Your upper arm should not move. ? At the top of the movement, your palm should face the floor. 11. Hold for __________ seconds. 12. Slowly return to the starting position and relax your muscles. Repeat __________ times. Complete this exercise __________ times a day. Rowing scapular retraction This is an exercise in which the shoulder blades (scapulae) are pulled toward each other (retraction). 6. Sit in a stable chair without armrests, or stand up. 7. Secure an exercise band to a stable object in front of you so the band is at shoulder height. 8. Hold one end of the exercise band in each hand. Your palms should  face down. 9. Bring your arms out straight in front of you. 10. Step back, away from the secured end of the exercise band, until the band stretches. 11. Pull the band backward. As you do this, bend your elbows and squeeze your shoulder blades together, but avoid letting the rest of your body move. Do not shrug your shoulders upward while you do this. 12. Stop when your elbows are at your sides or slightly behind your body. 13. Hold for __________ seconds. 14. Slowly straighten your arms to return to the starting position. Repeat __________ times. Complete this exercise __________ times a day. Posture and body mechanics Good posture and healthy body mechanics can help to relieve stress in your body's tissues and joints. Body mechanics refers to the movements and positions of your body while you do your daily activities. Posture is part of body mechanics. Good posture means:  Your spine is in its natural S-curve position (neutral).  Your shoulders are pulled back slightly.  Your head is not tipped forward. Follow these guidelines to improve your posture and body mechanics in your everyday activities. Standing   When standing, keep your spine neutral and your feet about hip width apart. Keep a slight bend in your knees. Your ears, shoulders, and hips should line up with each other.  When you do a task in which you lean forward while standing in one place for a long time, place one foot up on a stable object that is 2-4 inches (5-10 cm) high, such as a footstool. This helps keep your spine neutral. Sitting   When sitting, keep your spine neutral and keep your feet flat on the floor. Use a footrest, if necessary, and keep your thighs parallel to the floor. Avoid rounding your shoulders, and avoid tilting your head forward.  When working at a desk or a computer, keep your desk at a height where your hands are slightly lower than your elbows. Slide your chair under your desk so you are close  enough to maintain good posture.  When working at a computer, place your monitor at a height where you are looking straight ahead and you do not have to tilt your head forward or downward to look at the screen. Resting When lying down and resting, avoid positions that are most painful for you.  If you have pain with activities such as sitting, bending, stooping, or squatting (flexion-basedactivities), lie in a position in which your body does not bend very much. For example, avoid curling up on your side with your arms and knees near your chest (fetal position).  If you have pain with activities such as standing for a long time or reaching with your arms (extension-basedactivities), lie with your spine in a neutral position and bend your knees slightly. Try the following positions: ? Lie on your side with a pillow between your knees. ? Lie on your back with a pillow under your knees.  Lifting   When lifting objects, keep your feet at least shoulder width apart and tighten your abdominal muscles.  Bend your knees and hips and keep your spine neutral. It is important to lift using the strength of your legs, not your back. Do not lock your knees straight out.  Always ask for help to lift heavy or awkward objects. This information is not intended to replace advice given to you by your health care provider. Make sure you discuss any questions you have with your health care provider. Document Revised: 03/19/2019 Document Reviewed:  01/04/2019 Elsevier Patient Education  2020 [employer].  Back Exercises The following exercises strengthen the muscles that help to support the trunk and back. They also help to keep the lower back flexible. Doing these exercises can help to prevent back pain or lessen existing pain.  If you have back pain or discomfort, try doing these exercises 2-3 times each day or as told by your health care provider.  As your pain improves, do them once each day, but increase  the number of times that you repeat the steps for each exercise (do more repetitions).  To prevent the recurrence of back pain, continue to do these exercises once each day or as told by your health care provider. Do exercises exactly as told by your health care provider and adjust them as directed. It is normal to feel mild stretching, pulling, tightness, or discomfort as you do these exercises, but you should stop right away if you feel sudden pain or your pain gets worse. Exercises Single knee to chest Repeat these steps 3-5 times for each leg: 1. Lie on your back on a firm bed or the floor with your legs extended. 2. Bring one knee to your chest. Your other leg should stay extended and in contact with the floor. 3. Hold your knee in place by grabbing your knee or thigh with both hands and hold. 4. Pull on your knee until you feel a gentle stretch in your lower back or buttocks. 5. Hold the stretch for 10-30 seconds. 6. Slowly release and straighten your leg. Pelvic tilt Repeat these steps 5-10 times: 1. Lie on your back on a firm bed or the floor with your legs extended. 2. Bend your knees so they are pointing toward the ceiling and your feet are flat on the floor. 3. Tighten your lower abdominal muscles to press your lower back against the floor. This motion will tilt your pelvis so your tailbone points up toward the ceiling instead of pointing to your feet or the floor. 4. With gentle tension and even breathing, hold this position for 5-10 seconds. Cat-cow Repeat these steps until your lower back becomes more flexible: 1. Get into a hands-and-knees position on a firm surface. Keep your hands under your shoulders, and keep your knees under your hips. You may place padding under your knees for comfort. 2. Let your head hang down toward your chest. Contract your abdominal muscles and point your tailbone toward the floor so your lower back becomes rounded like the back of a cat. 3. Hold this  position for 5 seconds. 4. Slowly lift your head, let your abdominal muscles relax and point your tailbone up toward the ceiling so your back forms a sagging arch like the back of a cow. 5. Hold this position for 5 seconds.  Press-ups Repeat these steps 5-10 times: 1. Lie on your abdomen (face-down) on the floor. 2. Place your palms near your head, about shoulder-width apart. 3. Keeping your back as relaxed as possible and keeping your hips on the floor, slowly straighten your arms to raise the top half of your body and lift your shoulders. Do not use your back muscles to raise your upper torso. You may adjust the placement of your hands to make yourself more comfortable. 4. Hold this position for 5 seconds while you keep your back relaxed. 5. Slowly return to lying flat on the floor.  Bridges Repeat these steps 10 times: 1. Lie on your back on a firm surface. 2. [place]  your knees so they are pointing toward the ceiling and your feet are flat on the floor. Your arms should be flat at your sides, next to your body. 3. Tighten your buttocks muscles and lift your buttocks off the floor until your waist is at almost the same height as your knees. You should feel the muscles working in your buttocks and the back of your thighs. If you do not feel these muscles, slide your feet 1-2 inches farther away from your buttocks. 4. Hold this position for 3-5 seconds. 5. Slowly lower your hips to the starting position, and allow your buttocks muscles to relax completely. If this exercise is too easy, try doing it with your arms crossed over your chest. Abdominal crunches Repeat these steps 5-10 times: 1. Lie on your back on a firm bed or the floor with your legs extended. 2. Bend your knees so they are pointing toward the ceiling and your feet are flat on the floor. 3. Cross your arms over your chest. 4. Tip your chin slightly toward your chest without bending your neck. 5. Tighten your abdominal muscles  and slowly raise your trunk (torso) high enough to lift your shoulder blades a tiny bit off the floor. Avoid raising your torso higher than that because it can put too much stress on your low back and does not help to strengthen your abdominal muscles. 6. Slowly return to your starting position. Back lifts Repeat these steps 5-10 times: 1. Lie on your abdomen (face-down) with your arms at your sides, and rest your forehead on the floor. 2. Tighten the muscles in your legs and your buttocks. 3. Slowly lift your chest off the floor while you keep your hips pressed to the floor. Keep the back of your head in line with the curve in your back. Your eyes should be looking at the floor. 4. Hold this position for 3-5 seconds. 5. Slowly return to your starting position. Contact a health care provider if:  Your back pain or discomfort gets much worse when you do an exercise.  Your worsening back pain or discomfort does not lessen within 2 hours after you exercise. If you have any of these problems, stop doing these exercises right away. Do not do them again unless your health care provider says that you can. Get help right away if:  You develop sudden, severe back pain. If this happens, stop doing the exercises right away. Do not do them again unless your health care provider says that you can. This information is not intended to replace advice given to you by your health care provider. Make sure you discuss any questions you have with your health care provider. Document Revised: 04/01/2019 Document Reviewed: 08/27/2018 Elsevier Patient Education  [employer].

## 2020-04-07 ENCOUNTER — Encounter: Payer: Self-pay | Admitting: Internal Medicine

## 2020-04-14 ENCOUNTER — Ambulatory Visit: Payer: Medicare PPO | Admitting: Internal Medicine

## 2020-04-20 ENCOUNTER — Encounter: Payer: Self-pay | Admitting: Internal Medicine

## 2020-04-25 NOTE — Addendum Note (Signed)
Addended by: Orland Mustard on: 04/25/2020 11:18 PM   Modules accepted: Orders

## 2020-05-01 DIAGNOSIS — M7542 Impingement syndrome of left shoulder: Secondary | ICD-10-CM | POA: Diagnosis not present

## 2020-06-20 DIAGNOSIS — M7542 Impingement syndrome of left shoulder: Secondary | ICD-10-CM | POA: Diagnosis not present

## 2020-06-25 ENCOUNTER — Encounter: Payer: Self-pay | Admitting: Internal Medicine

## 2020-06-25 DIAGNOSIS — M7542 Impingement syndrome of left shoulder: Secondary | ICD-10-CM

## 2020-06-25 HISTORY — DX: Impingement syndrome of left shoulder: M75.42

## 2020-07-04 ENCOUNTER — Other Ambulatory Visit (INDEPENDENT_AMBULATORY_CARE_PROVIDER_SITE_OTHER): Payer: Medicare PPO

## 2020-07-04 ENCOUNTER — Other Ambulatory Visit: Payer: Self-pay

## 2020-07-04 DIAGNOSIS — E785 Hyperlipidemia, unspecified: Secondary | ICD-10-CM

## 2020-07-04 DIAGNOSIS — Z1329 Encounter for screening for other suspected endocrine disorder: Secondary | ICD-10-CM | POA: Diagnosis not present

## 2020-07-04 DIAGNOSIS — R030 Elevated blood-pressure reading, without diagnosis of hypertension: Secondary | ICD-10-CM

## 2020-07-04 DIAGNOSIS — Z1389 Encounter for screening for other disorder: Secondary | ICD-10-CM

## 2020-07-04 LAB — CBC WITH DIFFERENTIAL/PLATELET
Basophils Absolute: 0.1 10*3/uL (ref 0.0–0.1)
Basophils Relative: 1 % (ref 0.0–3.0)
Eosinophils Absolute: 0.1 10*3/uL (ref 0.0–0.7)
Eosinophils Relative: 2.1 % (ref 0.0–5.0)
HCT: 42.1 % (ref 36.0–46.0)
Hemoglobin: 14.2 g/dL (ref 12.0–15.0)
Lymphocytes Relative: 38.5 % (ref 12.0–46.0)
Lymphs Abs: 2.3 10*3/uL (ref 0.7–4.0)
MCHC: 33.7 g/dL (ref 30.0–36.0)
MCV: 92.8 fl (ref 78.0–100.0)
Monocytes Absolute: 0.4 10*3/uL (ref 0.1–1.0)
Monocytes Relative: 7.3 % (ref 3.0–12.0)
Neutro Abs: 3.1 10*3/uL (ref 1.4–7.7)
Neutrophils Relative %: 51.1 % (ref 43.0–77.0)
Platelets: 257 10*3/uL (ref 150.0–400.0)
RBC: 4.54 Mil/uL (ref 3.87–5.11)
RDW: 12.9 % (ref 11.5–15.5)
WBC: 6.1 10*3/uL (ref 4.0–10.5)

## 2020-07-04 LAB — COMPREHENSIVE METABOLIC PANEL
ALT: 15 U/L (ref 0–35)
AST: 14 U/L (ref 0–37)
Albumin: 4.5 g/dL (ref 3.5–5.2)
Alkaline Phosphatase: 73 U/L (ref 39–117)
BUN: 22 mg/dL (ref 6–23)
CO2: 30 mEq/L (ref 19–32)
Calcium: 9.4 mg/dL (ref 8.4–10.5)
Chloride: 100 mEq/L (ref 96–112)
Creatinine, Ser: 1.01 mg/dL (ref 0.40–1.20)
GFR: 53.47 mL/min — ABNORMAL LOW (ref >60.00)
Glucose, Bld: 94 mg/dL (ref 70–99)
Potassium: 3.7 mEq/L (ref 3.5–5.1)
Sodium: 138 mEq/L (ref 135–145)
Total Bilirubin: 0.8 mg/dL (ref 0.2–1.2)
Total Protein: 7.1 g/dL (ref 6.0–8.3)

## 2020-07-04 LAB — LIPID PANEL
Cholesterol: 199 mg/dL (ref 0–200)
HDL: 62.1 mg/dL (ref >39.00)
LDL Cholesterol: 107 mg/dL — ABNORMAL HIGH (ref 0–99)
NonHDL: 137.07
Total CHOL/HDL Ratio: 3
Triglycerides: 150 mg/dL — ABNORMAL HIGH (ref 0.0–149.0)
VLDL: 30 mg/dL (ref 0.0–40.0)

## 2020-07-04 LAB — TSH: TSH: 1.95 u[IU]/mL (ref 0.35–4.50)

## 2020-07-05 LAB — URINALYSIS, ROUTINE W REFLEX MICROSCOPIC
Bilirubin Urine: NEGATIVE
Glucose, UA: NEGATIVE
Hgb urine dipstick: NEGATIVE
Ketones, ur: NEGATIVE
Leukocytes,Ua: NEGATIVE
Nitrite: NEGATIVE
Protein, ur: NEGATIVE
Specific Gravity, Urine: 1.007 (ref 1.001–1.03)
pH: 8 (ref 5.0–8.0)

## 2020-07-06 ENCOUNTER — Other Ambulatory Visit: Payer: Self-pay | Admitting: Internal Medicine

## 2020-07-06 DIAGNOSIS — E785 Hyperlipidemia, unspecified: Secondary | ICD-10-CM

## 2020-07-06 MED ORDER — ATORVASTATIN CALCIUM 20 MG PO TABS: 20.0000 mg | ORAL_TABLET | Freq: Every day | ORAL | 3 refills | Status: DC

## 2020-07-11 ENCOUNTER — Ambulatory Visit: Payer: Medicare Other

## 2020-07-13 ENCOUNTER — Encounter: Payer: Self-pay | Admitting: Internal Medicine

## 2020-07-13 ENCOUNTER — Other Ambulatory Visit: Payer: Self-pay

## 2020-07-13 ENCOUNTER — Ambulatory Visit (INDEPENDENT_AMBULATORY_CARE_PROVIDER_SITE_OTHER): Payer: Medicare PPO | Admitting: Internal Medicine

## 2020-07-13 VITALS — BP 116/78 | HR 105 | Temp 98.2°F | Ht 60.0 in | Wt 136.4 lb

## 2020-07-13 DIAGNOSIS — N1831 Chronic kidney disease, stage 3a: Secondary | ICD-10-CM

## 2020-07-13 DIAGNOSIS — R000 Tachycardia, unspecified: Secondary | ICD-10-CM

## 2020-07-13 DIAGNOSIS — H02846 Edema of left eye, unspecified eyelid: Secondary | ICD-10-CM | POA: Diagnosis not present

## 2020-07-13 DIAGNOSIS — R200 Anesthesia of skin: Secondary | ICD-10-CM

## 2020-07-13 DIAGNOSIS — E538 Deficiency of other specified B group vitamins: Secondary | ICD-10-CM

## 2020-07-13 DIAGNOSIS — M7542 Impingement syndrome of left shoulder: Secondary | ICD-10-CM | POA: Diagnosis not present

## 2020-07-13 DIAGNOSIS — Z1231 Encounter for screening mammogram for malignant neoplasm of breast: Secondary | ICD-10-CM

## 2020-07-13 DIAGNOSIS — R600 Localized edema: Secondary | ICD-10-CM | POA: Diagnosis not present

## 2020-07-13 DIAGNOSIS — H02843 Edema of right eye, unspecified eyelid: Secondary | ICD-10-CM | POA: Diagnosis not present

## 2020-07-13 HISTORY — DX: Tachycardia, unspecified: R00.0

## 2020-07-13 LAB — VITAMIN B12: Vitamin B-12: 341 pg/mL (ref 211–911)

## 2020-07-13 NOTE — Progress Notes (Signed)
Chief Complaint  Patient presents with  . Follow-up   F/u  1. Chronic leg swelling unable to wear compression stockings in summer had abi 06/2019 and negative and less likely circulation issue. She does have grade 2 DD on echo with repeat echo upcoming  Leg discoloration worse did not respond to topical steroids derm rec oral steroids or antiinflammatories pt declines and vascular does not think related to PAD She is taking lasix 40 mg qd  2. ST EKG 11/2019 ST f/u cards as sch 3. Eyelid swelling lower b/l she at times does not get much rest due to husband on cpap 4. C/o numbness in great toes b/l which is new declines MRI spine today, agreeable to check B12 declines neurology for NCS for now but if gets worse will let me know. Denies low back pain  5. Arm upper pain better after seeing ortho impingement syndrome left shoulder   Review of Systems  Constitutional: Negative for weight loss.  HENT: Negative for hearing loss.   Eyes:       Eyelids puffy underneath  Respiratory: Negative for shortness of breath.   Cardiovascular: Negative for chest pain.  Gastrointestinal: Negative for abdominal pain.  Musculoskeletal: Negative for falls.  Skin: Positive for rash. Negative for itching.  Neurological: Negative for headaches.  Psychiatric/Behavioral: Negative for depression.   Past Medical History:  Diagnosis Date  . Allergy   . Arthritis   . Cataract   . Frequent headaches    seasonal  . GERD (gastroesophageal reflux disease)   . History of chicken pox   . History of colon polyps   . Hypercholesteremia   . IBS (irritable bowel syndrome)    constipation   . OSA on CPAP   . Osteoporosis   . Sleep apnea   . Tinnitus   . Venous insufficiency    Past Surgical History:  Procedure Laterality Date  . ABDOMINAL HYSTERECTOMY    . COLONOSCOPY WITH PROPOFOL N/A 03/14/2016   Procedure: COLONOSCOPY WITH PROPOFOL;  Surgeon: Hulen Luster, MD;  Location: [facility];  Service: Gastroenterology;   Laterality: N/A;  . DILATION AND CURETTAGE OF UTERUS    . TONSILLECTOMY     Family History  Problem Relation Age of Onset  . Prostate cancer Father   . Cancer Father        prostate   . Hypertension Father   . Lung cancer Sister   . Cancer Sister        lung smoker   . Deep vein thrombosis Sister   . Pulmonary embolism Sister   . Breast cancer Maternal Aunt 52  . Cancer Maternal Aunt        breast  . Stroke Mother   . Osteoporosis Mother   . Heart disease Maternal Grandmother        CHF  . AAA (abdominal aortic aneurysm) Paternal Grandmother   . Hypertension Paternal Grandmother   . Cancer Paternal Grandfather        prostate   Social History   Socioeconomic History  . Marital status: Married    Spouse name: Not on file  . Number of children: Not on file  . Years of education: Not on file  . Highest education level: Not on file  Occupational History  . Not on file  Tobacco Use  . Smoking status: Former Research scientist (life sciences)  . Smokeless tobacco: Never Used  . Tobacco comment: age [age] light   Vaping Use  . Vaping Use: Never used  Substance and Sexual Activity  . Alcohol use: No  . Drug use: No  . Sexual activity: Not Currently    Birth control/protection: Surgical  Other Topics Concern  . Not on file  Social History Narrative   Married, husband in house    Wears seat belt, husband owns guns, safe in relationship    No kids    Masters, previous nurse office work    No kids    Retired    Scientist, physiological Strain:   . Difficulty of Paying Living Expenses:   Food Insecurity:   . Worried About Charity fundraiser in the Last Year:   . Arboriculturist in the Last Year:   Transportation Needs:   . Film/video editor (Medical):   Marland Kitchen Lack of Transportation (Non-Medical):   Physical Activity:   . Days of Exercise per Week:   . Minutes of Exercise per Session:   Stress:   . Feeling of Stress :   Social Connections:   . Frequency of  Communication with Friends and Family:   . Frequency of Social Gatherings with Friends and Family:   . Attends Religious Services:   . Active Member of Clubs or Organizations:   . Attends Archivist Meetings:   Marland Kitchen Marital Status:   Intimate Partner Violence:   . Fear of Current or Ex-Partner:   . Emotionally Abused:   Marland Kitchen Physically Abused:   . Sexually Abused:    Current Meds  Medication Sig  . atorvastatin (LIPITOR) 20 MG tablet Take 1 tablet (20 mg total) by mouth daily at 6 PM.  . calcium-vitamin D (OSCAL WITH D) 500-200 MG-UNIT tablet Take 1 tablet by mouth.  . diclofenac sodium (VOLTAREN) 1 % GEL Apply topically 4 (four) times daily. Prn  . furosemide (LASIX) 40 MG tablet Take 1 tablet (40 mg total) by mouth daily as needed. In am  . metroNIDAZOLE (METROCREAM) 0.75 % cream APPLY TOPICALLY TO FACE TWICE A DAY  . Multiple Vitamin (MULTIVITAMIN) tablet Take 1 tablet by mouth daily.  . polyethylene glycol powder (GLYCOLAX/MIRALAX) powder Take by mouth.  . RESTASIS 0.05 % ophthalmic emulsion PLACE 1 DROP INTO BOTH EYES TWICE A DAY  . triamcinolone cream (KENALOG) 0.1 % Apply 1 application topically 2 (two) times daily. Prn legs b/l   Allergies  Allergen Reactions  . Bactrim [Sulfamethoxazole-Trimethoprim] Hives  . Codeine Other (See Comments)    Nervous system reaction insides coming out through skin  . Compazine [Prochlorperazine Edisylate] Other (See Comments)  . Demerol [Meperidine] Other (See Comments)    Nervous system reaction insides coming out through skin  . Indocin [Indomethacin] Other (See Comments)  . Other     Onions and garlic    . Reglan [Metoclopramide] Other (See Comments)   Recent Results (from the past 2160 hour(s))  Urinalysis, Routine w reflex microscopic     Status: None   Collection Time: 07/04/20  7:59 AM  Result Value Ref Range   Color, Urine YELLOW YELLOW   APPearance CLEAR CLEAR   Specific Gravity, Urine 1.007 1.001 - 1.03   pH 8.0 5.0 -  8.0   Glucose, UA NEGATIVE NEGATIVE   Bilirubin Urine NEGATIVE NEGATIVE   Ketones, ur NEGATIVE NEGATIVE   Hgb urine dipstick NEGATIVE NEGATIVE   Protein, ur NEGATIVE NEGATIVE   Nitrite NEGATIVE NEGATIVE   Leukocytes,Ua NEGATIVE NEGATIVE  TSH     Status: None   Collection Time: 07/04/20  7:59 AM  Result Value Ref Range   TSH 1.95 0.35 - 4.50 uIU/mL  CBC w/Diff     Status: None   Collection Time: 07/04/20  7:59 AM  Result Value Ref Range   WBC 6.1 4.0 - 10.5 K/uL   RBC 4.54 3.87 - 5.11 Mil/uL   Hemoglobin 14.2 12.0 - 15.0 g/dL   HCT 42.1 36 - 46 %   MCV 92.8 78.0 - 100.0 fl   MCHC 33.7 30.0 - 36.0 g/dL   RDW 12.9 11.5 - 15.5 %   Platelets 257.0 150 - 400 K/uL   Neutrophils Relative % 51.1 43 - 77 %   Lymphocytes Relative 38.5 12 - 46 %   Monocytes Relative 7.3 3 - 12 %   Eosinophils Relative 2.1 0 - 5 %   Basophils Relative 1.0 0 - 3 %   Neutro Abs 3.1 1.4 - 7.7 K/uL   Lymphs Abs 2.3 0.7 - 4.0 K/uL   Monocytes Absolute 0.4 0 - 1 K/uL   Eosinophils Absolute 0.1 0 - 0 K/uL   Basophils Absolute 0.1 0 - 0 K/uL  Lipid panel     Status: Abnormal   Collection Time: 07/04/20  7:59 AM  Result Value Ref Range   Cholesterol 199 0 - 200 mg/dL    Comment: ATP III Classification       Desirable:  < 200 mg/dL               Borderline High:  200 - 239 mg/dL          High:  > = 240 mg/dL   Triglycerides 150.0 (H) 0 - 149 mg/dL    Comment: Normal:  <150 mg/dLBorderline High:  150 - 199 mg/dL   HDL 62.10 >39.00 mg/dL   VLDL 30.0 0.0 - 40.0 mg/dL   LDL Cholesterol 107 (H) 0 - 99 mg/dL   Total CHOL/HDL Ratio 3     Comment:                Men          Women1/2 Average Risk     3.4          3.3Average Risk          5.0          4.42X Average Risk          9.6          7.13X Average Risk          15.0          11.0                       NonHDL 137.07     Comment: NOTE:  Non-HDL goal should be 30 mg/dL higher than patient's LDL goal (i.e. LDL goal of < 70 mg/dL, would have non-HDL goal of < 100  mg/dL)  Comprehensive metabolic panel     Status: Abnormal   Collection Time: 07/04/20  7:59 AM  Result Value Ref Range   Sodium 138 135 - 145 mEq/L   Potassium 3.7 3.5 - 5.1 mEq/L   Chloride 100 96 - 112 mEq/L   CO2 30 19 - 32 mEq/L   Glucose, Bld 94 70 - 99 mg/dL   BUN 22 6 - 23 mg/dL   Creatinine, Ser 1.01 0.40 - 1.20 mg/dL   Total Bilirubin 0.8 0.2 - 1.2 mg/dL   Alkaline Phosphatase 73 39 - 117 U/L  AST 14 0 - 37 U/L   ALT 15 0 - 35 U/L   Total Protein 7.1 6.0 - 8.3 g/dL   Albumin 4.5 3.5 - 5.2 g/dL   GFR 53.47 (L) >60.00 mL/min   Calcium 9.4 8.4 - 10.5 mg/dL   Objective  Body mass index is 26.64 kg/m. Wt Readings from Last 3 Encounters:  07/13/20 136 lb 6.4 oz (61.9 kg)  04/06/20 138 lb 6.4 oz (62.8 kg)  01/13/20 136 lb (61.7 kg)   Temp Readings from Last 3 Encounters:  07/13/20 98.2 F (36.8 C) (Oral)  04/06/20 (!) 97.1 F (36.2 C) (Temporal)  10/01/19 97.6 F (36.4 C) (Oral)   BP Readings from Last 3 Encounters:  07/13/20 116/78  04/06/20 130/90  11/11/19 140/80   Pulse Readings from Last 3 Encounters:  07/13/20 (!) 105  04/06/20 95  11/11/19 (!) 107    Physical Exam Vitals and nursing note reviewed.  Constitutional:      Appearance: Normal appearance. She is well-developed, well-groomed and overweight.  HENT:     Head: Normocephalic and atraumatic.  Eyes:     Conjunctiva/sclera: Conjunctivae normal.     Pupils: Pupils are equal, round, and reactive to light.  Cardiovascular:     Rate and Rhythm: Normal rate and regular rhythm.     Heart sounds: Normal heart sounds. No murmur heard.   Pulmonary:     Effort: Pulmonary effort is normal.     Breath sounds: Normal breath sounds.  Skin:    General: Skin is warm and dry.  Neurological:     General: No focal deficit present.     Mental Status: She is alert and oriented to person, place, and time. Mental status is at baseline.     Gait: Gait normal.  Psychiatric:        Attention and Perception:  Attention and perception normal.        Mood and Affect: Mood and affect normal.        Speech: Speech normal.        Behavior: Behavior normal. Behavior is cooperative.        Thought Content: Thought content normal.        Cognition and Memory: Cognition and memory normal.        Judgment: Judgment normal.     Assessment  Plan  Numbness in feet Vitamin B12 deficiency - Plan: B12 Consider neurology, mri L spine and ncs in future declines for now  Edema of eyelid of both eyes Increase sleep  Cool compress F/u eye make sure no blocked tear duct Other ddx amyloid declines w/u for now can to serum + urine protein electrophoresis and serum free light chain ratio to w/u declines for now  Leg edema Lasix 40 mg qd  Sinus tachycardia F/u cards upcoming   Impingement syndrome of left shoulder  Improved with [facility] help   HM Flu shot utd10/8/20 prevnar utd, utd pna 23 vaccine  Tdap had 08/16/16 Had zostervax  covid 2/2  shingrix vaccine per pt had 2/2   Out of age window for pap s/p hysterectomy 1984 for fibroids 1 ovary intact  Mammogram neg 9/28/20neg DEXA 09/03/16 +osteoporosis was on oral bisphonates and had jaw necrosis declines other meds for now I.e prolia discussedprev. rec calcium 600 mg 1-2 x per day and D3 1000-2000 IU daily Declines meds disc strontium supplement otc  Colonoscopy h/o polyps last [place] Dr. Candace Cruise diverticulosis 03/14/16 bxs negativewill see if needed in 2022 h/o polyps  pt does not know type  Dermatology Dr. Kellie Moor Aks right cheek LN2 saw 2019 f/u in 1 year12/2020  Former smoker age [age] light smoker   Eye Dr. Merla Riches saw 12/20/19sch 11/2019 Dentist Dr. Kendell Bane  GI Dr. Alice Reichert  Provider: Dr. Olivia Mackie McLean-Scocuzza-[facility]

## 2020-07-13 NOTE — Patient Instructions (Signed)
Let me know if you want to see neurology for nerve conduction study or if you want me to order an MRI of your low back  Paresthesia Paresthesia is an abnormal burning or prickling sensation. It is usually felt in the hands, arms, legs, or feet. However, it may occur in any part of the body. Usually, paresthesia is not painful. It may feel like:  Tingling or numbness.  Buzzing.  Itching. Paresthesia may occur without any clear cause, or it may be caused by:  Breathing too quickly (hyperventilation).  Pressure on a nerve.  An underlying medical condition.  Side effects of a medication.  Nutritional deficiencies.  Exposure to toxic chemicals. Most people experience temporary (transient) paresthesia at some time in their lives. For some people, it may be long-lasting (chronic) because of an underlying medical condition. If you have paresthesia that lasts a long time, you may need to be evaluated by your health care provider. Follow these instructions at home: Alcohol use   Do not drink alcohol if: ? Your health care provider tells you not to drink. ? You are pregnant, may be pregnant, or are planning to become pregnant.  If you drink alcohol: ? Limit how much you use to:  0-1 drink a day for women.  0-2 drinks a day for men. ? Be aware of how much alcohol is in your drink. In the [place] one drink equals one 12 oz bottle of beer (355 mL), one 5 oz glass of wine (148 mL), or one 1 oz glass of hard liquor (44 mL). Nutrition   Eat a healthy diet. This includes: ? Eating foods that are high in fiber, such as fresh fruits and vegetables, whole grains, and beans. ? Limiting foods that are high in fat and processed sugars, such as fried or sweet foods. General instructions  Take over-the-counter and prescription medicines only as told by your health care provider.  Do not use any products that contain nicotine or tobacco, such as cigarettes and e-cigarettes. These can keep blood  from reaching damaged nerves. If you need help quitting, ask your health care provider.  If you have diabetes, work closely with your health care provider to keep your blood sugar under control.  If you have numbness in your feet: ? Check every day for signs of injury or infection. Watch for redness, warmth, and swelling. ? Wear padded socks and comfortable shoes. These help protect your feet.  Keep all follow-up visits as told by your health care provider. This is important. Contact a health care provider if you:  Have paresthesia that gets worse or does not go away.  Have a burning or prickling feeling that gets worse when you walk.  Have pain, cramps, or dizziness.  Develop a rash. Get help right away if you:  Feel weak.  Have trouble walking or moving.  Have problems with speech, understanding, or vision.  Feel confused.  Cannot control your bladder or bowel movements.  Have numbness after an injury.  Develop new weakness in an arm or leg.  Faint. Summary  Paresthesia is an abnormal burning or prickling sensation that is usually felt in the hands, arms, legs, or feet. It may also occur in other parts of the body.  Paresthesia may occur without any clear cause, or it may be caused by breathing too quickly (hyperventilation), pressure on a nerve, an underlying medical condition, side effects of a medication, nutritional deficiencies, or exposure to toxic chemicals.  If you have paresthesia that  lasts a long time, you may need to be evaluated by your health care provider. This information is not intended to replace advice given to you by your health care provider. Make sure you discuss any questions you have with your health care provider. Document Revised: 12/21/2018 Document Reviewed: 12/04/2017 Elsevier Patient Education  2020 [employer].

## 2020-07-13 NOTE — Progress Notes (Signed)
Patient flagged: Current status:  PATIENT IS OVERDUE FOR BMI FOLLOW UP PLAN BMI is estimated to be 26.6 based on the last recorded weight and height

## 2020-07-19 ENCOUNTER — Telehealth: Payer: Self-pay | Admitting: Internal Medicine

## 2020-07-19 ENCOUNTER — Encounter: Payer: Self-pay | Admitting: Internal Medicine

## 2020-07-19 NOTE — Telephone Encounter (Signed)
Pt called to get lab results °

## 2020-07-20 ENCOUNTER — Other Ambulatory Visit: Payer: Self-pay | Admitting: Internal Medicine

## 2020-07-20 ENCOUNTER — Encounter: Payer: Self-pay | Admitting: Internal Medicine

## 2020-07-20 DIAGNOSIS — E785 Hyperlipidemia, unspecified: Secondary | ICD-10-CM

## 2020-07-20 MED ORDER — ATORVASTATIN CALCIUM 10 MG PO TABS: 10.0000 mg | ORAL_TABLET | Freq: Every day | ORAL | 3 refills | Status: DC

## 2020-07-20 NOTE — Telephone Encounter (Signed)
See patient message encounter

## 2020-07-20 NOTE — Telephone Encounter (Signed)
Carol Stone, CMA  07/19/2020 1:38 PM EDT Back to Top    Patient has seen results in mychart but no response. Left message to return call with any questions.    Carol Glow McLean-Scocuzza, MD  07/13/2020 3:17 PM EDT     B12 normal not reason for numbness in toes

## 2020-07-23 ENCOUNTER — Encounter: Payer: Self-pay | Admitting: Internal Medicine

## 2020-07-24 NOTE — Telephone Encounter (Signed)
Previous encounter 07/20/20:  [name] to McLean-Scocuzza, Nino Glow, MD     6:35 PM Thank you. I will let you know by Monday about the kidney ultrasound. McLean-Scocuzza, Nino Glow, MD to [name]   TM  5:12 PM [place] resent sorry for this  I will not be in the office 07/31/20 to 9/3 so let me know before or after about the kidney ultrasound please  Thank you

## 2020-07-24 NOTE — Addendum Note (Signed)
Addended by: Orland Mustard on: 07/24/2020 12:20 PM   Modules accepted: Orders

## 2020-07-31 ENCOUNTER — Other Ambulatory Visit: Payer: Self-pay

## 2020-07-31 ENCOUNTER — Ambulatory Visit
Admission: RE | Admit: 2020-07-31 | Discharge: 2020-07-31 | Disposition: A | Discharge status: Home / Self Care | Payer: Medicare PPO | Source: Ambulatory Visit | Attending: Internal Medicine | Admitting: Internal Medicine

## 2020-07-31 DIAGNOSIS — H02846 Edema of left eye, unspecified eyelid: Secondary | ICD-10-CM | POA: Insufficient documentation

## 2020-07-31 DIAGNOSIS — N183 Chronic kidney disease, stage 3 unspecified: Secondary | ICD-10-CM | POA: Diagnosis not present

## 2020-07-31 DIAGNOSIS — N1831 Chronic kidney disease, stage 3a: Secondary | ICD-10-CM | POA: Insufficient documentation

## 2020-07-31 DIAGNOSIS — H02843 Edema of right eye, unspecified eyelid: Secondary | ICD-10-CM | POA: Insufficient documentation

## 2020-08-02 ENCOUNTER — Encounter: Payer: Self-pay | Admitting: Internal Medicine

## 2020-08-02 DIAGNOSIS — N183 Chronic kidney disease, stage 3 unspecified: Secondary | ICD-10-CM | POA: Insufficient documentation

## 2020-08-10 DIAGNOSIS — X32XXXA Exposure to sunlight, initial encounter: Secondary | ICD-10-CM | POA: Diagnosis not present

## 2020-08-10 DIAGNOSIS — D1801 Hemangioma of skin and subcutaneous tissue: Secondary | ICD-10-CM | POA: Diagnosis not present

## 2020-08-10 DIAGNOSIS — L814 Other melanin hyperpigmentation: Secondary | ICD-10-CM | POA: Diagnosis not present

## 2020-08-10 DIAGNOSIS — L821 Other seborrheic keratosis: Secondary | ICD-10-CM | POA: Diagnosis not present

## 2020-08-23 ENCOUNTER — Encounter: Payer: Self-pay | Admitting: Internal Medicine

## 2020-09-11 ENCOUNTER — Other Ambulatory Visit: Payer: Self-pay

## 2020-09-11 ENCOUNTER — Ambulatory Visit
Admission: RE | Admit: 2020-09-11 | Discharge: 2020-09-11 | Disposition: A | Discharge status: Home / Self Care | Payer: Medicare PPO | Source: Ambulatory Visit | Attending: Internal Medicine | Admitting: Internal Medicine

## 2020-09-11 DIAGNOSIS — Z1231 Encounter for screening mammogram for malignant neoplasm of breast: Secondary | ICD-10-CM | POA: Insufficient documentation

## 2020-11-07 ENCOUNTER — Ambulatory Visit (INDEPENDENT_AMBULATORY_CARE_PROVIDER_SITE_OTHER): Payer: Medicare PPO

## 2020-11-07 ENCOUNTER — Other Ambulatory Visit: Payer: Self-pay

## 2020-11-07 ENCOUNTER — Telehealth: Payer: Self-pay | Admitting: *Deleted

## 2020-11-07 DIAGNOSIS — I050 Rheumatic mitral stenosis: Secondary | ICD-10-CM

## 2020-11-07 DIAGNOSIS — I5189 Other ill-defined heart diseases: Secondary | ICD-10-CM

## 2020-11-07 LAB — ECHOCARDIOGRAM COMPLETE
Area-P 1/2: 2.93 cm2
S' Lateral: 2.7 cm

## 2020-11-07 NOTE — Telephone Encounter (Signed)
Spoke to pt's husband, Dominica Severin [facility]), notified of results below. No questions at this time.

## 2020-11-07 NOTE — Telephone Encounter (Signed)
-----   Message from Kate Sable, MD sent at 11/07/2020  3:01 PM EST ----- Echocardiogram shows mild mitral valve stenosis, which is stable and not significantly different from prior echocardiogram.  Ejection fraction/systolic function is normal.

## 2020-11-13 ENCOUNTER — Other Ambulatory Visit: Payer: Self-pay

## 2020-11-13 ENCOUNTER — Encounter: Payer: Self-pay | Admitting: Cardiology

## 2020-11-13 ENCOUNTER — Ambulatory Visit (INDEPENDENT_AMBULATORY_CARE_PROVIDER_SITE_OTHER): Payer: Medicare PPO | Admitting: Cardiology

## 2020-11-13 VITALS — BP 124/62 | HR 84 | Ht 60.0 in | Wt 130.0 lb

## 2020-11-13 DIAGNOSIS — E785 Hyperlipidemia, unspecified: Secondary | ICD-10-CM | POA: Diagnosis not present

## 2020-11-13 DIAGNOSIS — R600 Localized edema: Secondary | ICD-10-CM

## 2020-11-13 DIAGNOSIS — I050 Rheumatic mitral stenosis: Secondary | ICD-10-CM | POA: Diagnosis not present

## 2020-11-13 NOTE — Progress Notes (Signed)
Cardiology Office Note:    Date:  11/13/2020   ID:  Carol Stone, DOB [birth year], MRN [id number]  PCP:  McLean-Scocuzza, Nino Glow, MD  Cardiologist:  Kate Sable, MD  Electrophysiologist:  None   Referring MD: McLean-Scocuzza, Olivia Mackie *   Chief Complaint  Patient presents with  . Annual Exam    Pt states no new Sx.    History of Present Illness:    Carol Stone is a 74 y.o. female with a hx of mild mitral stenosis, hyperlipidemia, lower extremity edema who presents for follow-up.  Being seen due to mitral stenosis and hyperlipidemia.  Last seen about a year ago where echo showed mild mitral valve stenosis.  She feels well, denies chest pain, shortness of breath.  Has no concerns at this time.  Repeat echocardiogram was performed about a week ago.  She still has edema worse when she is on her feet, gets better when she raises her legs.   Past Medical History:  Diagnosis Date  . Allergy   . Arthritis   . Cataract   . Frequent headaches    seasonal  . GERD (gastroesophageal reflux disease)   . History of chicken pox   . History of colon polyps   . Hypercholesteremia   . IBS (irritable bowel syndrome)    constipation   . OSA on CPAP   . Osteoporosis   . Sleep apnea   . Tinnitus   . Venous insufficiency     Past Surgical History:  Procedure Laterality Date  . ABDOMINAL HYSTERECTOMY    . COLONOSCOPY WITH PROPOFOL N/A 03/14/2016   Procedure: COLONOSCOPY WITH PROPOFOL;  Surgeon: Hulen Luster, MD;  Location: [facility] ENDOSCOPY;  Service: Gastroenterology;  Laterality: N/A;  . DILATION AND CURETTAGE OF UTERUS    . TONSILLECTOMY      Current Medications: Current Meds  Medication Sig  . atorvastatin (LIPITOR) 10 MG tablet Take 1 tablet (10 mg total) by mouth daily at 6 PM.  . calcium-vitamin D (OSCAL WITH D) 500-200 MG-UNIT tablet Take 1 tablet by mouth.  . diclofenac sodium (VOLTAREN) 1 % GEL Apply topically 4 (four) times daily. Prn  . furosemide (LASIX) 40 MG tablet  Take 1 tablet (40 mg total) by mouth daily as needed. In am  . metroNIDAZOLE (METROCREAM) 0.75 % cream APPLY TOPICALLY TO FACE TWICE A DAY  . Multiple Vitamin (MULTIVITAMIN) tablet Take 1 tablet by mouth daily.  . polyethylene glycol powder (GLYCOLAX/MIRALAX) powder Take by mouth.  . RESTASIS 0.05 % ophthalmic emulsion PLACE 1 DROP INTO BOTH EYES TWICE A DAY  . triamcinolone cream (KENALOG) 0.1 % Apply 1 application topically 2 (two) times daily. Prn legs b/l     Allergies:   Bactrim [sulfamethoxazole-trimethoprim], Codeine, Compazine [prochlorperazine edisylate], Demerol [meperidine], Indocin [indomethacin], Other, and Reglan [metoclopramide]   Social History   Socioeconomic History  . Marital status: Married    Spouse name: Not on file  . Number of children: Not on file  . Years of education: Not on file  . Highest education level: Not on file  Occupational History  . Not on file  Tobacco Use  . Smoking status: Former Research scientist (life sciences)  . Smokeless tobacco: Never Used  . Tobacco comment: age [age] light   Vaping Use  . Vaping Use: Never used  Substance and Sexual Activity  . Alcohol use: No  . Drug use: No  . Sexual activity: Not Currently    Birth control/protection: Surgical  Other Topics Concern  .  Not on file  Social History Narrative   Married, husband in house    Wears seat belt, husband owns guns, safe in relationship    No kids    Masters, previous nurse office work    No kids    Retired    Scientist, physiological Strain:   . Difficulty of Paying Living Expenses: Not on file  Food Insecurity:   . Worried About Charity fundraiser in the Last Year: Not on file  . Ran Out of Food in the Last Year: Not on file  Transportation Needs:   . Lack of Transportation (Medical): Not on file  . Lack of Transportation (Non-Medical): Not on file  Physical Activity:   . Days of Exercise per Week: Not on file  . Minutes of Exercise per Session: Not on  file  Stress:   . Feeling of Stress : Not on file  Social Connections:   . Frequency of Communication with Friends and Family: Not on file  . Frequency of Social Gatherings with Friends and Family: Not on file  . Attends Religious Services: Not on file  . Active Member of Clubs or Organizations: Not on file  . Attends Archivist Meetings: Not on file  . Marital Status: Not on file     Family History: The patient's family history includes AAA (abdominal aortic aneurysm) in her paternal grandmother; Breast cancer (age of onset: 85) in her maternal aunt; Cancer in her father, maternal aunt, paternal grandfather, and sister; Deep vein thrombosis in her sister; Heart disease in her maternal grandmother; Hypertension in her father and paternal grandmother; Lung cancer in her sister; Osteoporosis in her mother; Prostate cancer in her father; Pulmonary embolism in her sister; Stroke in her mother.  ROS:   Please see the history of present illness.     All other systems reviewed and are negative.  EKGs/Labs/Other Studies Reviewed:    The following studies were reviewed today: TTE 11/11/19  1. Left ventricular ejection fraction, by visual estimation, is 65 to 70%. The left ventricle has normal function. There is no left ventricular hypertrophy.  2. Left ventricular diastolic parameters are consistent with Grade II diastolic dysfunction (pseudonormalization).  3. Global right ventricle has normal systolic function.The right ventricular size is normal. No increase in right ventricular wall thickness.  4. Left atrial size was normal.  5. Right atrial size was normal.  6. Moderate calcification of the posterior mitral valve leaflet(s).  7. The mitral valve is degenerative. No evidence of mitral valve regurgitation. Mild mitral stenosis.  8. The tricuspid valve is grossly normal. Tricuspid valve regurgitation is not demonstrated.  9. The aortic valve was not well visualized. Aortic valve  regurgitation is not visualized. No evidence of aortic valve sclerosis or stenosis. 10. The pulmonic valve was not well visualized. Pulmonic valve regurgitation is not visualized. 11. The inferior vena cava is normal in size with greater than 50% respiratory variability, suggesting right atrial pressure of 3 mmHg.  EKG:  EKG is  ordered today.  The ekg ordered today demonstrates normal sinus rhythm.  Recent Labs: 07/04/2020: ALT 15; BUN 22; Creatinine, Ser 1.01; Hemoglobin 14.2; Platelets 257.0; Potassium 3.7; Sodium 138; TSH 1.95  Recent Lipid Panel    Component Value Date/Time   CHOL 199 07/04/2020 0759   TRIG 150.0 (H) 07/04/2020 0759   HDL 62.10 07/04/2020 0759   CHOLHDL 3 07/04/2020 0759   VLDL 30.0 07/04/2020 0759   LDLCALC  107 (H) 07/04/2020 0759    Physical Exam:    VS:  BP 124/62   Pulse 84   Ht 5' (1.524 m)   Wt 130 lb (59 kg)   BMI 25.39 kg/m     Wt Readings from Last 3 Encounters:  11/13/20 130 lb (59 kg)  07/13/20 136 lb 6.4 oz (61.9 kg)  04/06/20 138 lb 6.4 oz (62.8 kg)     GEN:  Well nourished, well developed in no acute distress HEENT: Normal NECK: No JVD; No carotid bruits LYMPHATICS: No lymphadenopathy CARDIAC: RRR, no murmurs, rubs, gallops RESPIRATORY:  Clear to auscultation without rales, wheezing or rhonchi  ABDOMEN: Soft, non-tender, non-distended MUSCULOSKELETAL:  No edema; varicose veins noted SKIN: Warm and dry NEUROLOGIC:  Alert and oriented x 3 PSYCHIATRIC:  Normal affect   ASSESSMENT:    1. Mitral valve stenosis, unspecified etiology   2. Hyperlipidemia, unspecified hyperlipidemia type   3. Leg edema      PLAN:    In order of problems listed above:  1. Mild mitral stenosis, echo 10/2020 showed normal EF, 55 to 60%, impaired relaxation, mild mitral stenosis, mean mitral gradient 4 mmHg at heart rate 89 bpm.  Not significantly different from prior.  Continue monitoring with serial echoes. 2. Hyperlipidemia, on Lipitor. 3. Varicose  veins in lower extremity, dependent edema.  Conservative measures with leg raising while in a seated position advised.  Follow-up in 18 months.    This note was generated in part or whole with voice recognition software. Voice recognition is usually quite accurate but there are transcription errors that can and very often do occur. I apologize for any typographical errors that were not detected and corrected.  Medication Adjustments/Labs and Tests Ordered: Current medicines are reviewed at length with the patient today.  Concerns regarding medicines are outlined above.  Orders Placed This Encounter  Procedures  . EKG 12-Lead  . ECHOCARDIOGRAM COMPLETE   No orders of the defined types were placed in this encounter.   Patient Instructions  Medication Instructions:  Your physician recommends that you continue on your current medications as directed. Please refer to the Current Medication list given to you today.  *If you need a refill on your cardiac medications before your next appointment, please call your pharmacy*   Lab Work: None Ordered If you have labs (blood work) drawn today and your tests are completely normal, you will receive your results only by: Marland Kitchen MyChart Message (if you have MyChart) OR . A paper copy in the mail If you have any lab test that is abnormal or we need to change your treatment, we will call you to review the results.   Testing/Procedures:  Your physician has requested that you have an echocardiogram just prior to your follow up in 12 months. Echocardiography is a painless test that uses sound waves to create images of your heart. It provides your doctor with information about the size and shape of your heart and how well your heart's chambers and valves are working. This procedure takes approximately one hour. There are no restrictions for this procedure.    Follow-Up: At [facility], you and your health needs are our priority.  As part of our  continuing mission to provide you with exceptional heart care, we have created designated Provider Care Teams.  These Care Teams include your primary Cardiologist (physician) and Advanced Practice Providers (APPs -  Physician Assistants and Nurse Practitioners) who all work together to provide you with the care  you need, when you need it.  We recommend signing up for the patient portal called "MyChart".  Sign up information is provided on this After Visit Summary.  MyChart is used to connect with patients for Virtual Visits (Telemedicine).  Patients are able to view lab/test results, encounter notes, upcoming appointments, etc.  Non-urgent messages can be sent to your provider as well.   To learn more about what you can do with MyChart, go to [email or web address].    Your next appointment:   12 month(s)  The format for your next appointment:   In Person  Provider:   Kate Sable, MD   Other Instructions      Signed, Kate Sable, MD  11/13/2020 12:46 PM    [place]

## 2020-11-13 NOTE — Patient Instructions (Signed)
Medication Instructions:  Your physician recommends that you continue on your current medications as directed. Please refer to the Current Medication list given to you today.  *If you need a refill on your cardiac medications before your next appointment, please call your pharmacy*   Lab Work: None Ordered If you have labs (blood work) drawn today and your tests are completely normal, you will receive your results only by: Marland Kitchen MyChart Message (if you have MyChart) OR . A paper copy in the mail If you have any lab test that is abnormal or we need to change your treatment, we will call you to review the results.   Testing/Procedures:  Your physician has requested that you have an echocardiogram just prior to your follow up in 12 months. Echocardiography is a painless test that uses sound waves to create images of your heart. It provides your doctor with information about the size and shape of your heart and how well your heart's chambers and valves are working. This procedure takes approximately one hour. There are no restrictions for this procedure.    Follow-Up: At [facility], you and your health needs are our priority.  As part of our continuing mission to provide you with exceptional heart care, we have created designated Provider Care Teams.  These Care Teams include your primary Cardiologist (physician) and Advanced Practice Providers (APPs -  Physician Assistants and Nurse Practitioners) who all work together to provide you with the care you need, when you need it.  We recommend signing up for the patient portal called "MyChart".  Sign up information is provided on this After Visit Summary.  MyChart is used to connect with patients for Virtual Visits (Telemedicine).  Patients are able to view lab/test results, encounter notes, upcoming appointments, etc.  Non-urgent messages can be sent to your provider as well.   To learn more about what you can do with MyChart, go to  [email or web address].    Your next appointment:   12 month(s)  The format for your next appointment:   In Person  Provider:   Kate Sable, MD   Other Instructions

## 2020-12-04 DIAGNOSIS — H2513 Age-related nuclear cataract, bilateral: Secondary | ICD-10-CM | POA: Diagnosis not present

## 2020-12-31 ENCOUNTER — Encounter: Payer: Self-pay | Admitting: Internal Medicine

## 2021-01-02 ENCOUNTER — Ambulatory Visit (INDEPENDENT_AMBULATORY_CARE_PROVIDER_SITE_OTHER): Payer: Medicare PPO

## 2021-01-02 ENCOUNTER — Encounter: Payer: Self-pay | Admitting: Internal Medicine

## 2021-01-02 VITALS — Ht 60.0 in | Wt 130.0 lb

## 2021-01-02 DIAGNOSIS — Z0000 Encounter for general adult medical examination without abnormal findings: Secondary | ICD-10-CM

## 2021-01-02 NOTE — Patient Instructions (Addendum)
Carol Stone , Thank you for taking time to come for your Medicare Wellness Visit. I appreciate your ongoing commitment to your health goals. Please review the following plan we discussed and let me know if I can assist you in the future.   These are the goals we discussed: Goals      Patient Stated   .  Follow up with Primary Care Provider (pt-stated)      As needed        This is a list of the screening recommended for you and due dates:  Health Maintenance  Topic Date Due  . Colon Cancer Screening  03/14/2026  . Tetanus Vaccine  08/16/2026  . Flu Shot  Completed  . DEXA scan (bone density measurement)  Completed  . COVID-19 Vaccine  Completed  .  Hepatitis C: One time screening is recommended by Center for Disease Control  (CDC) for  adults born from [birth year] through 1966.   Completed  . Pneumonia vaccines  Completed    Immunizations Immunization History  Administered Date(s) Administered  . Influenza-Unspecified 09/29/2014, 10/04/2015, 10/04/2016, 10/02/2017, 10/08/2018, 09/16/2019, 08/16/2020  . Moderna Sars-Covid-2 Vaccination 12/21/2019, 01/21/2020, 10/24/2020  . Pneumococcal Conjugate-13 06/30/2014, 09/13/2016  . Pneumococcal-Unspecified 08/25/2011  . Td 01/17/1999  . Tdap 08/16/2016  . Zoster 09/26/2006  . Zoster Recombinat (Shingrix) 08/25/2018, 10/25/2018   Keep all routine maintenance appointments.   Next scheduled lab 01/11/21 @ 9:30  Follow up 01/17/21 @ 8:00  Conditions/risks identified: none new  Follow up in one year for your annual wellness visit    Preventive Care 65 Years and Older, Female Preventive care refers to lifestyle choices and visits with your health care provider that can promote health and wellness. What does preventive care include?  A yearly physical exam. This is also called an annual well check.  Dental exams once or twice a year.  Routine eye exams. Ask your health care provider how often you should have your eyes  checked.  Personal lifestyle choices, including:  Daily care of your teeth and gums.  Regular physical activity.  Eating a healthy diet.  Avoiding tobacco and drug use.  Limiting alcohol use.  Practicing safe sex.  Taking low-dose aspirin every day.  Taking vitamin and mineral supplements as recommended by your health care provider. What happens during an annual well check? The services and screenings done by your health care provider during your annual well check will depend on your age, overall health, lifestyle risk factors, and family history of disease. Counseling  Your health care provider may ask you questions about your:  Alcohol use.  Tobacco use.  Drug use.  Emotional well-being.  Home and relationship well-being.  Sexual activity.  Eating habits.  History of falls.  Memory and ability to understand (cognition).  Work and work Statistician.  Reproductive health. Screening  You may have the following tests or measurements:  Height, weight, and BMI.  Blood pressure.  Lipid and cholesterol levels. These may be checked every 5 years, or more frequently if you are over 48 years old.  Skin check.  Lung cancer screening. You may have this screening every year starting at age 47 if you have a 30-pack-year history of smoking and currently smoke or have quit within the past 15 years.  Fecal occult blood test (FOBT) of the stool. You may have this test every year starting at age 15.  Flexible sigmoidoscopy or colonoscopy. You may have a sigmoidoscopy every 5 years or a colonoscopy every 10  years starting at age 35.  Hepatitis C blood test.  Hepatitis B blood test.  Sexually transmitted disease (STD) testing.  Diabetes screening. This is done by checking your blood sugar (glucose) after you have not eaten for a while (fasting). You may have this done every 1-3 years.  Bone density scan. This is done to screen for osteoporosis. You may have this done  starting at age 25.  Mammogram. This may be done every 1-2 years. Talk to your health care provider about how often you should have regular mammograms. Talk with your health care provider about your test results, treatment options, and if necessary, the need for more tests. Vaccines  Your health care provider may recommend certain vaccines, such as:  Influenza vaccine. This is recommended every year.  Tetanus, diphtheria, and acellular pertussis (Tdap, Td) vaccine. You may need a Td booster every 10 years.  Zoster vaccine. You may need this after age 68.  Pneumococcal 13-valent conjugate (PCV13) vaccine. One dose is recommended after age 68.  Pneumococcal polysaccharide (PPSV23) vaccine. One dose is recommended after age 32. Talk to your health care provider about which screenings and vaccines you need and how often you need them. This information is not intended to replace advice given to you by your health care provider. Make sure you discuss any questions you have with your health care provider. Document Released: 12/22/2015 Document Revised: 08/14/2016 Document Reviewed: 09/26/2015 Elsevier Interactive Patient Education  2017 [place] Prevention in the Home Falls can cause injuries. They can happen to people of all ages. There are many things you can do to make your home safe and to help prevent falls. What can I do on the outside of my home?  Regularly fix the edges of walkways and driveways and fix any cracks.  Remove anything that might make you trip as you walk through a door, such as a raised step or threshold.  Trim any bushes or trees on the path to your home.  Use bright outdoor lighting.  Clear any walking paths of anything that might make someone trip, such as rocks or tools.  Regularly check to see if handrails are loose or broken. Make sure that both sides of any steps have handrails.  Any raised decks and porches should have guardrails on the  edges.  Have any leaves, snow, or ice cleared regularly.  Use sand or salt on walking paths during winter.  Clean up any spills in your garage right away. This includes oil or grease spills. What can I do in the bathroom?  Use night lights.  Install grab bars by the toilet and in the tub and shower. Do not use towel bars as grab bars.  Use non-skid mats or decals in the tub or shower.  If you need to sit down in the shower, use a plastic, non-slip stool.  Keep the floor dry. Clean up any water that spills on the floor as soon as it happens.  Remove soap buildup in the tub or shower regularly.  Attach bath mats securely with double-sided non-slip rug tape.  Do not have throw rugs and other things on the floor that can make you trip. What can I do in the bedroom?  Use night lights.  Make sure that you have a light by your bed that is easy to reach.  Do not use any sheets or blankets that are too big for your bed. They should not hang down onto the floor.  Have  a firm chair that has side arms. You can use this for support while you get dressed.  Do not have throw rugs and other things on the floor that can make you trip. What can I do in the kitchen?  Clean up any spills right away.  Avoid walking on wet floors.  Keep items that you use a lot in easy-to-reach places.  If you need to reach something above you, use a strong step stool that has a grab bar.  Keep electrical cords out of the way.  Do not use floor polish or wax that makes floors slippery. If you must use wax, use non-skid floor wax.  Do not have throw rugs and other things on the floor that can make you trip. What can I do with my stairs?  Do not leave any items on the stairs.  Make sure that there are handrails on both sides of the stairs and use them. Fix handrails that are broken or loose. Make sure that handrails are as long as the stairways.  Check any carpeting to make sure that it is firmly  attached to the stairs. Fix any carpet that is loose or worn.  Avoid having throw rugs at the top or bottom of the stairs. If you do have throw rugs, attach them to the floor with carpet tape.  Make sure that you have a light switch at the top of the stairs and the bottom of the stairs. If you do not have them, ask someone to add them for you. What else can I do to help prevent falls?  Wear shoes that:  Do not have high heels.  Have rubber bottoms.  Are comfortable and fit you well.  Are closed at the toe. Do not wear sandals.  If you use a stepladder:  Make sure that it is fully opened. Do not climb a closed stepladder.  Make sure that both sides of the stepladder are locked into place.  Ask someone to hold it for you, if possible.  Clearly mark and make sure that you can see:  Any grab bars or handrails.  First and last steps.  Where the edge of each step is.  Use tools that help you move around (mobility aids) if they are needed. These include:  Canes.  Walkers.  Scooters.  Crutches.  Turn on the lights when you go into a dark area. Replace any light bulbs as soon as they burn out.  Set up your furniture so you have a clear path. Avoid moving your furniture around.  If any of your floors are uneven, fix them.  If there are any pets around you, be aware of where they are.  Review your medicines with your doctor. Some medicines can make you feel dizzy. This can increase your chance of falling. Ask your doctor what other things that you can do to help prevent falls. This information is not intended to replace advice given to you by your health care provider. Make sure you discuss any questions you have with your health care provider. Document Released: 09/21/2009 Document Revised: 05/02/2016 Document Reviewed: 12/30/2014 Elsevier Interactive Patient Education  2017 [employer].

## 2021-01-02 NOTE — Progress Notes (Signed)
Subjective:   Carol Stone is a 75 y.o. female who presents for Medicare Annual (Subsequent) preventive examination.  Review of Systems    No ROS.  Medicare Wellness Virtual Visit.    Cardiac Risk Factors include: advanced age (>36men, >63 women)     Objective:    Today's Vitals   01/02/21 0911  Weight: 130 lb (59 kg)  Height: 5' (1.524 m)   Body mass index is 25.39 kg/m.  Advanced Directives 01/02/2021 07/09/2019 06/14/2019 03/03/2016 03/01/2016  Does Patient Have a Medical Advance Directive? Yes Yes Yes Yes Yes  Type of Advance Directive - [place];Living will [place];Living will [place];Living will [place];Living will  Does patient want to make changes to medical advance directive? No - Patient declined No - Patient declined No - Patient declined - No - Patient declined  Copy of [place] in Chart? - No - copy requested - Yes No - copy requested    Current Medications (verified) Outpatient Encounter Medications as of 01/02/2021  Medication Sig  . atorvastatin (LIPITOR) 10 MG tablet Take 1 tablet (10 mg total) by mouth daily at 6 PM.  . calcium-vitamin D (OSCAL WITH D) 500-200 MG-UNIT tablet Take 1 tablet by mouth.  . diclofenac sodium (VOLTAREN) 1 % GEL Apply topically 4 (four) times daily. Prn  . furosemide (LASIX) 40 MG tablet Take 1 tablet (40 mg total) by mouth daily as needed. In am  . metroNIDAZOLE (METROCREAM) 0.75 % cream APPLY TOPICALLY TO FACE TWICE A DAY  . Multiple Vitamin (MULTIVITAMIN) tablet Take 1 tablet by mouth daily.  . polyethylene glycol powder (GLYCOLAX/MIRALAX) powder Take by mouth.  . RESTASIS 0.05 % ophthalmic emulsion PLACE 1 DROP INTO BOTH EYES TWICE A DAY  . triamcinolone cream (KENALOG) 0.1 % Apply 1 application topically 2 (two) times daily. Prn legs b/l   No facility-administered encounter medications on file as of 01/02/2021.    Allergies  (verified) Bactrim [sulfamethoxazole-trimethoprim], Codeine, Compazine [prochlorperazine edisylate], Demerol [meperidine], Indocin [indomethacin], Other, and Reglan [metoclopramide]   History: Past Medical History:  Diagnosis Date  . Allergy   . Arthritis   . Cataract   . Frequent headaches    seasonal  . GERD (gastroesophageal reflux disease)   . History of chicken pox   . History of colon polyps   . Hypercholesteremia   . IBS (irritable bowel syndrome)    constipation   . OSA on CPAP   . Osteoporosis   . Sleep apnea   . Tinnitus   . Venous insufficiency    Past Surgical History:  Procedure Laterality Date  . ABDOMINAL HYSTERECTOMY    . COLONOSCOPY WITH PROPOFOL N/A 03/14/2016   Procedure: COLONOSCOPY WITH PROPOFOL;  Surgeon: Hulen Luster, MD;  Location: [facility];  Service: Gastroenterology;  Laterality: N/A;  . DILATION AND CURETTAGE OF UTERUS    . TONSILLECTOMY     Family History  Problem Relation Age of Onset  . Prostate cancer Father   . Cancer Father        prostate   . Hypertension Father   . Lung cancer Sister   . Cancer Sister        lung smoker   . Deep vein thrombosis Sister   . Pulmonary embolism Sister   . Breast cancer Maternal Aunt 52  . Cancer Maternal Aunt        breast  . Stroke Mother   .  Osteoporosis Mother   . Heart disease Maternal Grandmother        CHF  . AAA (abdominal aortic aneurysm) Paternal Grandmother   . Hypertension Paternal Grandmother   . Cancer Paternal Grandfather        prostate   Social History   Socioeconomic History  . Marital status: Married    Spouse name: Not on file  . Number of children: Not on file  . Years of education: Not on file  . Highest education level: Not on file  Occupational History  . Not on file  Tobacco Use  . Smoking status: Former Games developer  . Smokeless tobacco: Never Used  . Tobacco comment: age [age] light   Vaping Use  . Vaping Use: Never used  Substance and Sexual Activity  . Alcohol  use: No  . Drug use: No  . Sexual activity: Not Currently    Birth control/protection: Surgical  Other Topics Concern  . Not on file  Social History Narrative   Married, husband in house    Wears seat belt, husband owns guns, safe in relationship    No kids    Masters, previous nurse office work    No kids    Retired    Chemical engineer Strain: Low Risk   . Difficulty of Paying Living Expenses: Not hard at all  Food Insecurity: No Food Insecurity  . Worried About Programme researcher, broadcasting/film/video in the Last Year: Never true  . Ran Out of Food in the Last Year: Never true  Transportation Needs: No Transportation Needs  . Lack of Transportation (Medical): No  . Lack of Transportation (Non-Medical): No  Physical Activity: Sufficiently Active  . Days of Exercise per Week: 5 days  . Minutes of Exercise per Session: 60 min  Stress: No Stress Concern Present  . Feeling of Stress : Not at all  Social Connections: Unknown  . Frequency of Communication with Friends and Family: More than three times a week  . Frequency of Social Gatherings with Friends and Family: More than three times a week  . Attends Religious Services: Not on file  . Active Member of Clubs or Organizations: Not on file  . Attends Banker Meetings: Not on file  . Marital Status: Not on file    Tobacco Counseling Counseling given: Not Answered Comment: age 6-20 light    Clinical Intake:  Pre-visit preparation completed: Yes        Diabetes: No  How often do you need to have someone help you when you read instructions, pamphlets, or other written materials from your doctor or pharmacy?: 1 - Never   Interpreter Needed?: No      Activities of Daily Living In your present state of health, do you have any difficulty performing the following activities: 01/02/2021  Hearing? N  Vision? N  Difficulty concentrating or making decisions? N  Walking or climbing stairs? N   Dressing or bathing? N  Doing errands, shopping? N  Preparing Food and eating ? N  Using the Toilet? N  In the past six months, have you accidently leaked urine? N  Comment Managed with daily liner as needed. Stress incontinence.  Do you have problems with loss of bowel control? N  Managing your Medications? N  Managing your Finances? N  Housekeeping or managing your Housekeeping? N  Some recent data might be hidden    Patient Care Team: McLean-Scocuzza, Pasty Spillers, MD as PCP -  General (Internal Medicine) Kate Sable, MD as PCP - Cardiology (Cardiology)  Indicate any recent Medical Services you may have received from other than [facility] providers in the past year (date may be approximate).     Assessment:   This is a routine wellness examination for Kristina.  I connected with Morganne today by telephone and verified that I am speaking with the correct person using two identifiers. Location patient: home Location provider: work Persons participating in the virtual visit: patient, Marine scientist.    I discussed the limitations, risks, security and privacy concerns of performing an evaluation and management service by telephone and the availability of in person appointments. The patient expressed understanding and verbally consented to this telephonic visit.    Interactive audio and video telecommunications were attempted between this provider and patient, however failed, due to patient having technical difficulties OR patient did not have access to video capability.  We continued and completed visit with audio only.  Some vital signs may be absent or patient reported.   Hearing/Vision screen  Hearing Screening   125Hz  250Hz  500Hz  1000Hz  2000Hz  3000Hz  4000Hz  6000Hz  8000Hz   Right ear:           Left ear:           Comments: Patient is able to hear conversational tones without difficulty.  No issues reported.  Vision Screening Comments: Wears corrective lenses  Visual acuity not assessed,  virtual visit. They have seen their ophthalmologist in the last 12 months.   Dietary issues and exercise activities discussed: Current Exercise Habits: Home exercise routine, Type of exercise: walking, Time (Minutes): 40, Frequency (Times/Week): 5, Weekly Exercise (Minutes/Week): 200, Intensity: Moderate  Healthy diet Good water intake  Goals      Patient Stated   .  Follow up with Primary Care Provider (pt-stated)      As needed       Depression Screen PHQ 2/9 Scores 01/02/2021 07/13/2020 04/06/2020 10/01/2019 07/09/2019 11/20/2018  PHQ - 2 Score 0 0 0 0 0 0    Fall Risk Fall Risk  01/02/2021 07/13/2020 04/06/2020 10/01/2019 07/09/2019  Falls in the past year? 1 1 1  0 0  Number falls in past yr: 0 0 0 - -  Injury with Fall? - 1 1 - -  Risk for fall due to : - History of fall(s) History of fall(s) - -  Follow up Falls evaluation completed Falls evaluation completed Falls evaluation completed - -    FALL RISK PREVENTION PERTAINING TO THE HOME: Handrails in use when climbing stairs? Yes Home free of loose throw rugs in walkways, pet beds, electrical cords, etc? Yes  Adequate lighting in your home to reduce risk of falls? Yes   ASSISTIVE DEVICES UTILIZED TO PREVENT FALLS: Use of a cane, walker or w/c? No   TIMED UP AND GO: Was the test performed? No . Virtual visit.  Cognitive Function:  Patient is alert and oriented x3.  Denies difficulty focusing, making decisions, memory loss.  Enjoys quilting and other brain health activities. MMSE/6CIT deferred. Normal by direct communication/observation.    6CIT Screen 07/09/2019  What Year? 0 points  What month? 0 points  What time? 0 points  Count back from 20 0 points  Months in reverse 0 points  Repeat phrase 0 points  Total Score 0    Immunizations Immunization History  Administered Date(s) Administered  . Influenza-Unspecified 09/29/2014, 10/04/2015, 10/04/2016, 10/02/2017, 10/08/2018, 09/16/2019, 08/16/2020  . Moderna  Sars-Covid-2 Vaccination 12/21/2019, 01/21/2020, 10/24/2020  .  Pneumococcal Conjugate-13 06/30/2014, 09/13/2016  . Pneumococcal-Unspecified 08/25/2011  . Td 01/17/1999  . Tdap 08/16/2016  . Zoster 09/26/2006  . Zoster Recombinat (Shingrix) 08/25/2018, 10/25/2018   Health Maintenance Health Maintenance  Topic Date Due  . COLONOSCOPY (Pts 45-10yrs Insurance coverage will need to be confirmed)  03/14/2026  . TETANUS/TDAP  08/16/2026  . INFLUENZA VACCINE  Completed  . DEXA SCAN  Completed  . COVID-19 Vaccine  Completed  . Hepatitis C Screening  Completed  . PNA vac Low Risk Adult  Completed   Colorectal cancer screening: Type of screening: Colonoscopy. Completed 03/14/16. Repeat every 10 years.  Mammogram status: Completed 09/11/20. Repeat every year. MM 3D SCREEN BREAST BILATERAL   Bone Density status: Completed 09/03/16. Results reflect: Bone density results: OSTEOPOROSIS. Repeat every 2 years.  calcium-vitamin D (OSCAL WITH D) 500-200 MG-UNIT tablet.  Lung Cancer Screening: (Low Dose CT Chest recommended if Age 15-80 years, 30 pack-year currently smoking OR have quit w/in 15years.) does not qualify.   Hepatitis C Screening: Completed 02/19/19.  Cpap- not in use. Patient states no longer required. Please remove from chart.   Vision Screening: Recommended annual ophthalmology exams for early detection of glaucoma and other disorders of the eye. Is the patient up to date with their annual eye exam?  Yes  Who is the provider or what is the [facility] of the [facility] which the patient [facility]  Dental Screening: Recommended annual dental exams for proper oral hygiene.  Community Resource Referral / Chronic Care Management: CRR required this visit?  No   CCM required this visit?  No      Plan:   Keep all routine maintenance appointments.   Next scheduled lab 01/11/21 @ 9:30  Follow up 01/17/21 @ 8:00  I have personally reviewed and noted the  following in the patient's chart:   . Medical and social history . Use of alcohol, tobacco or illicit drugs  . Current medications and supplements . Functional ability and status . Nutritional status . Physical activity . Advanced directives . List of other physicians . Hospitalizations, surgeries, and ER visits in previous 12 months . Vitals . Screenings to include cognitive, depression, and falls . Referrals and appointments  In addition, I have reviewed and discussed with patient certain preventive protocols, quality metrics, and best practice recommendations. A written personalized care plan for preventive services as well as general preventive health recommendations were provided to patient via mychart.     Ashok Pall, LPN   [id number]    Nurse notes:  Cpap- not in use. Patient states no longer required via testing. Please remove from chart.

## 2021-01-08 ENCOUNTER — Encounter: Payer: Self-pay | Admitting: Internal Medicine

## 2021-01-08 DIAGNOSIS — R600 Localized edema: Secondary | ICD-10-CM

## 2021-01-08 MED ORDER — FUROSEMIDE 40 MG PO TABS: 40.0000 mg | ORAL_TABLET | Freq: Every day | ORAL | 1 refills | Status: DC | PRN

## 2021-01-10 ENCOUNTER — Telehealth: Payer: Self-pay | Admitting: *Deleted

## 2021-01-10 ENCOUNTER — Other Ambulatory Visit: Payer: Self-pay | Admitting: Internal Medicine

## 2021-01-10 DIAGNOSIS — R030 Elevated blood-pressure reading, without diagnosis of hypertension: Secondary | ICD-10-CM

## 2021-01-10 DIAGNOSIS — E785 Hyperlipidemia, unspecified: Secondary | ICD-10-CM

## 2021-01-10 NOTE — Telephone Encounter (Signed)
Please place future orders for lab appt.  

## 2021-01-11 ENCOUNTER — Other Ambulatory Visit: Payer: Self-pay

## 2021-01-11 ENCOUNTER — Other Ambulatory Visit (INDEPENDENT_AMBULATORY_CARE_PROVIDER_SITE_OTHER): Payer: Medicare PPO

## 2021-01-11 DIAGNOSIS — E785 Hyperlipidemia, unspecified: Secondary | ICD-10-CM | POA: Diagnosis not present

## 2021-01-11 DIAGNOSIS — R030 Elevated blood-pressure reading, without diagnosis of hypertension: Secondary | ICD-10-CM | POA: Diagnosis not present

## 2021-01-11 LAB — CBC WITH DIFFERENTIAL/PLATELET
Basophils Absolute: 0.1 10*3/uL (ref 0.0–0.1)
Basophils Relative: 1.1 % (ref 0.0–3.0)
Eosinophils Absolute: 0.1 10*3/uL (ref 0.0–0.7)
Eosinophils Relative: 2.5 % (ref 0.0–5.0)
HCT: 39.5 % (ref 36.0–46.0)
Hemoglobin: 13.4 g/dL (ref 12.0–15.0)
Lymphocytes Relative: 39.5 % (ref 12.0–46.0)
Lymphs Abs: 2.2 10*3/uL (ref 0.7–4.0)
MCHC: 33.8 g/dL (ref 30.0–36.0)
MCV: 91.1 fl (ref 78.0–100.0)
Monocytes Absolute: 0.4 10*3/uL (ref 0.1–1.0)
Monocytes Relative: 7.4 % (ref 3.0–12.0)
Neutro Abs: 2.7 10*3/uL (ref 1.4–7.7)
Neutrophils Relative %: 49.5 % (ref 43.0–77.0)
Platelets: 238 10*3/uL (ref 150.0–400.0)
RBC: 4.33 Mil/uL (ref 3.87–5.11)
RDW: 13.5 % (ref 11.5–15.5)
WBC: 5.6 10*3/uL (ref 4.0–10.5)

## 2021-01-11 LAB — COMPREHENSIVE METABOLIC PANEL
ALT: 15 U/L (ref 0–35)
AST: 14 U/L (ref 0–37)
Albumin: 4.2 g/dL (ref 3.5–5.2)
Alkaline Phosphatase: 69 U/L (ref 39–117)
BUN: 20 mg/dL (ref 6–23)
CO2: 31 mEq/L (ref 19–32)
Calcium: 9.5 mg/dL (ref 8.4–10.5)
Chloride: 104 mEq/L (ref 96–112)
Creatinine, Ser: 0.92 mg/dL (ref 0.40–1.20)
GFR: 61 mL/min (ref >60.00)
Glucose, Bld: 93 mg/dL (ref 70–99)
Potassium: 4.3 mEq/L (ref 3.5–5.1)
Sodium: 140 mEq/L (ref 135–145)
Total Bilirubin: 0.6 mg/dL (ref 0.2–1.2)
Total Protein: 6.7 g/dL (ref 6.0–8.3)

## 2021-01-11 LAB — LIPID PANEL
Cholesterol: 202 mg/dL — ABNORMAL HIGH (ref 0–200)
HDL: 63.3 mg/dL (ref >39.00)
LDL Cholesterol: 115 mg/dL — ABNORMAL HIGH (ref 0–99)
NonHDL: 138.6
Total CHOL/HDL Ratio: 3
Triglycerides: 120 mg/dL (ref 0.0–149.0)
VLDL: 24 mg/dL (ref 0.0–40.0)

## 2021-01-17 ENCOUNTER — Other Ambulatory Visit: Payer: Self-pay

## 2021-01-17 ENCOUNTER — Ambulatory Visit (INDEPENDENT_AMBULATORY_CARE_PROVIDER_SITE_OTHER): Payer: Medicare PPO | Admitting: Internal Medicine

## 2021-01-17 ENCOUNTER — Encounter: Payer: Self-pay | Admitting: Internal Medicine

## 2021-01-17 VITALS — BP 120/68 | HR 105 | Temp 98.4°F | Ht 60.0 in | Wt 130.4 lb

## 2021-01-17 DIAGNOSIS — H1132 Conjunctival hemorrhage, left eye: Secondary | ICD-10-CM | POA: Diagnosis not present

## 2021-01-17 DIAGNOSIS — R220 Localized swelling, mass and lump, head: Secondary | ICD-10-CM | POA: Diagnosis not present

## 2021-01-17 DIAGNOSIS — R6881 Early satiety: Secondary | ICD-10-CM | POA: Diagnosis not present

## 2021-01-17 DIAGNOSIS — R200 Anesthesia of skin: Secondary | ICD-10-CM | POA: Insufficient documentation

## 2021-01-17 DIAGNOSIS — M47812 Spondylosis without myelopathy or radiculopathy, cervical region: Secondary | ICD-10-CM

## 2021-01-17 DIAGNOSIS — E162 Hypoglycemia, unspecified: Secondary | ICD-10-CM

## 2021-01-17 DIAGNOSIS — M47816 Spondylosis without myelopathy or radiculopathy, lumbar region: Secondary | ICD-10-CM

## 2021-01-17 DIAGNOSIS — Z1231 Encounter for screening mammogram for malignant neoplasm of breast: Secondary | ICD-10-CM | POA: Diagnosis not present

## 2021-01-17 DIAGNOSIS — K449 Diaphragmatic hernia without obstruction or gangrene: Secondary | ICD-10-CM | POA: Diagnosis not present

## 2021-01-17 DIAGNOSIS — T753XXA Motion sickness, initial encounter: Secondary | ICD-10-CM

## 2021-01-17 DIAGNOSIS — K219 Gastro-esophageal reflux disease without esophagitis: Secondary | ICD-10-CM

## 2021-01-17 DIAGNOSIS — I890 Lymphedema, not elsewhere classified: Secondary | ICD-10-CM | POA: Insufficient documentation

## 2021-01-17 HISTORY — DX: Anesthesia of skin: R20.0

## 2021-01-17 HISTORY — DX: Conjunctival hemorrhage, left eye: H11.32

## 2021-01-17 HISTORY — DX: Early satiety: R68.81

## 2021-01-17 HISTORY — DX: Lymphedema, not elsewhere classified: I89.0

## 2021-01-17 HISTORY — DX: Hypoglycemia, unspecified: E16.2

## 2021-01-17 NOTE — Patient Instructions (Addendum)
Nerve conduction study/EMG with neurology   Glucose tablets   Compression knee highs [employer]  Consider colonoscopy and upper endoscopy   nexium or prilosec otc or otc rolaids   Food Choices for Gastroesophageal Reflux Disease, Adult When you have gastroesophageal reflux disease (GERD), the foods you eat and your eating habits are very important. Choosing the right foods can help ease the discomfort of GERD. Consider working with a dietitian to help you make healthy food choices. What are tips for following this plan? Reading food labels  Look for foods that are low in saturated fat. Foods that have less than 5% of daily value (DV) of fat and 0 g of trans fats may help with your symptoms. Cooking  Cook foods using methods other than frying. This may include baking, steaming, grilling, or broiling. These are all methods that do not need a lot of fat for cooking.  To add flavor, try to use herbs that are low in spice and acidity. Meal planning  Choose healthy foods that are low in fat, such as fruits, vegetables, whole grains, low-fat dairy products, lean meats, fish, and poultry.  Eat frequent, small meals instead of three large meals each day. Eat your meals slowly, in a relaxed setting. Avoid bending over or lying down until 2-3 hours after eating.  Limit high-fat foods such as fatty meats or fried foods.  Limit your intake of fatty foods, such as oils, butter, and shortening.  Avoid the following as told by your health care provider: ? Foods that cause symptoms. These may be different for different people. Keep a food diary to keep track of foods that cause symptoms. ? Alcohol. ? Drinking large amounts of liquid with meals. ? Eating meals during the 2-3 hours before bed.   Lifestyle  Maintain a healthy weight. Ask your health care provider what weight is healthy for you. If you need to lose weight, work with your health care provider to do so safely.  Exercise for  at least 30 minutes on 5 or more days each week, or as told by your health care provider.  Avoid wearing clothes that fit tightly around your waist and chest.  Do not use any products that contain nicotine or tobacco. These products include cigarettes, chewing tobacco, and vaping devices, such as e-cigarettes. If you need help quitting, ask your health care provider.  Sleep with the head of your bed raised. Use a wedge under the mattress or blocks under the bed frame to raise the head of the bed.  Chew sugar-free gum after mealtimes. What foods should I eat? Eat a healthy, well-balanced diet of fruits, vegetables, whole grains, low-fat dairy products, lean meats, fish, and poultry. Each person is different. Foods that may trigger symptoms in one person may not trigger any symptoms in another person. Work with your health care provider to identify foods that are safe for you. The items listed above may not be a complete list of recommended foods and beverages. Contact a dietitian for more information.   What foods should I avoid? Limiting some of these foods may help manage the symptoms of GERD. Everyone is different. Consult a dietitian or your health care provider to help you identify the exact foods to avoid, if any. Fruits Any fruits prepared with added fat. Any fruits that cause symptoms. For some people this may include citrus fruits, such as oranges, grapefruit, pineapple, and lemons. Vegetables Deep-fried vegetables. [place] fries. Any vegetables prepared with added fat. Any vegetables  that cause symptoms. For some people, this may include tomatoes and tomato products, chili peppers, onions and garlic, and horseradish. Grains Pastries or quick breads with added fat. Meats and other proteins High-fat meats, such as fatty beef or pork, hot dogs, ribs, ham, sausage, salami, and bacon. Fried meat or protein, including fried fish and fried chicken. Nuts and nut butters, in large  amounts. Dairy Whole milk and chocolate milk. Sour cream. Cream. Ice cream. Cream cheese. Milkshakes. Fats and oils Butter. Margarine. Shortening. Ghee. Beverages Coffee and tea, with or without caffeine. Carbonated beverages. Sodas. Energy drinks. Fruit juice made with acidic fruits, such as orange or grapefruit. Tomato juice. Alcoholic drinks. Sweets and desserts Chocolate and cocoa. Donuts. Seasonings and condiments Pepper. Peppermint and spearmint. Added salt. Any condiments, herbs, or seasonings that cause symptoms. For some people, this may include curry, hot sauce, or vinegar-based salad dressings. The items listed above may not be a complete list of foods and beverages to avoid. Contact a dietitian for more information. Questions to ask your health care provider Diet and lifestyle changes are usually the first steps that are taken to manage symptoms of GERD. If diet and lifestyle changes do not improve your symptoms, talk with your health care provider about taking medicines. Where to find more information  [employer] Gastrointestinal Disorders: aboutgerd.org Summary  When you have gastroesophageal reflux disease (GERD), food and lifestyle choices may be very helpful in easing the discomfort of GERD.  Eat frequent, small meals instead of three large meals each day. Eat your meals slowly, in a relaxed setting. Avoid bending over or lying down until 2-3 hours after eating.  Limit high-fat foods such as fatty meats or fried foods. This information is not intended to replace advice given to you by your health care provider. Make sure you discuss any questions you have with your health care provider. Document Revised: 06/05/2020 Document Reviewed: 06/05/2020 Elsevier Patient Education  [place].   Hiatal Hernia  A hiatal hernia occurs when part of the stomach slides above the muscle that separates the abdomen from the chest (diaphragm). A person can be  born with a hiatal hernia (congenital), or it may develop over time. In almost all cases of hiatal hernia, only the top part of the stomach pushes through the diaphragm. Many people have a hiatal hernia with no symptoms. The larger the hernia, the more likely it is that you will have symptoms. In some cases, a hiatal hernia allows stomach acid to flow back into the tube that carries food from your mouth to your stomach (esophagus). This may cause heartburn symptoms. Severe heartburn symptoms may mean that you have developed a condition called gastroesophageal reflux disease (GERD). What are the causes? This condition is caused by a weakness in the opening (hiatus) where the esophagus passes through the diaphragm to attach to the upper part of the stomach. A person may be born with a weakness in the hiatus, or a weakness can develop over time. What increases the risk? This condition is more likely to develop in:  Older people. Age is a major risk factor for a hiatal hernia, especially if you are over the age of 62.  Pregnant women.  People who are overweight.  People who have frequent constipation. What are the signs or symptoms? Symptoms of this condition usually develop in the form of GERD symptoms. Symptoms include:  Heartburn.  Belching.  Indigestion.  Trouble swallowing.  Coughing or wheezing.  Sore throat.  Hoarseness.  Chest pain.  Nausea and vomiting. How is this diagnosed? This condition may be diagnosed during testing for GERD. Tests that may be done include:  X-rays of your stomach or chest.  An upper gastrointestinal (GI) series. This is an X-ray exam of your GI tract that is taken after you swallow a chalky liquid that shows up clearly on the X-ray.  Endoscopy. This is a procedure to look into your stomach using a thin, flexible tube that has a tiny camera and light on the end of it. How is this treated? This condition may be treated by:  Dietary and lifestyle  changes to help reduce GERD symptoms.  Medicines. These may include: ? Over-the-counter antacids. ? Medicines that make your stomach empty more quickly. ? Medicines that block the production of stomach acid (H2 blockers). ? Stronger medicines to reduce stomach acid (proton pump inhibitors).  Surgery to repair the hernia, if other treatments are not helping. If you have no symptoms, you may not need treatment. Follow these instructions at home: Lifestyle and activity  Do not use any products that contain nicotine or tobacco, such as cigarettes and e-cigarettes. If you need help quitting, ask your health care provider.  Try to achieve and maintain a healthy body weight.  Avoid putting pressure on your abdomen. Anything that puts pressure on your abdomen increases the amount of acid that may be pushed up into your esophagus. ? Avoid bending over, especially after eating. ? Raise the head of your bed by putting blocks under the legs. This keeps your head and esophagus higher than your stomach. ? Do not wear tight clothing around your chest or stomach. ? Try not to strain when having a bowel movement, when urinating, or when lifting heavy objects. Eating and drinking  Avoid foods that can worsen GERD symptoms. These may include: ? Fatty foods, like fried foods. ? Citrus fruits, like oranges or lemon. ? Other foods and drinks that contain acid, like orange juice or tomatoes. ? Spicy food. ? Chocolate.  Eat frequent small meals instead of three large meals a day. This helps prevent your stomach from getting too full. ? Eat slowly. ? Do not lie down right after eating. ? Do not eat 1-2 hours before bed.  Do not drink beverages with caffeine. These include cola, coffee, cocoa, and tea.  Do not drink alcohol. General instructions  Take over-the-counter and prescription medicines only as told by your health care provider.  Keep all follow-up visits as told by your health care  provider. This is important. Contact a health care provider if:  Your symptoms are not controlled with medicines or lifestyle changes.  You are having trouble swallowing.  You have coughing or wheezing that will not go away. Get help right away if:  Your pain is getting worse.  Your pain spreads to your arms, neck, jaw, teeth, or back.  You have shortness of breath.  You sweat for no reason.  You feel sick to your stomach (nauseous) or you vomit.  You vomit blood.  You have bright red blood in your stools.  You have black, tarry stools. This information is not intended to replace advice given to you by your health care provider. Make sure you discuss any questions you have with your health care provider. Document Revised: 11/07/2017 Document Reviewed: 06/30/2017 Elsevier Patient Education  2021 [place].   Subconjunctival Hemorrhage Subconjunctival hemorrhage is bleeding that happens between the white part of your eye (sclera) and the  clear membrane that covers the outside of your eye (conjunctiva). There are many tiny blood vessels near the surface of your eye. A subconjunctival hemorrhage happens when one or more of these vessels breaks and bleeds, causing a red patch to appear on your eye. This is similar to a bruise. Depending on the amount of bleeding, the red patch may only cover a small area of your eye or it may cover the entire visible part of the sclera. If a lot of blood collects under the conjunctiva, there may also be swelling. Subconjunctival hemorrhages do not affect your vision or cause pain, but your eye may feel irritated if there is swelling. Subconjunctival hemorrhages usually do not require treatment, and they usually disappear on their own within two weeks. What are the causes? This condition may be caused by:  Mild trauma, such as rubbing your eye too hard.  Blunt injuries, such as from playing sports or having contact with a deployed  airbag.  Coughing, sneezing, or vomiting.  Straining, such as when lifting a heavy object.  High blood pressure.  Recent eye surgery.  Diabetes.  Certain medicines, especially blood thinners (anticoagulants).  Other conditions, such as eye tumors, bleeding disorders, or blood vessel abnormalities. Subconjunctival hemorrhages can also happen without an obvious cause. What are the signs or symptoms? Symptoms of this condition include:  A bright red or dark red patch on the white part of the eye. The red area may: ? Spread out to cover a larger area of the eye before it goes away. ? Turn brownish-yellow before it goes away.  Swelling around the eye.  Mild eye irritation.   How is this diagnosed? This condition is diagnosed with a physical exam. If your subconjunctival hemorrhage was caused by trauma, your health care provider may refer you to an eye specialist (ophthalmologist) or another specialist to check for other injuries. You may have other tests, including:  An eye exam.  A blood pressure check.  Blood tests to check for bleeding disorders. If your subconjunctival hemorrhage was caused by trauma, X-rays or a CT scan may be done to check for other injuries. How is this treated? Usually, treatment is not needed for this condition. If you have discomfort, your health care provider may recommend eye drops or cold compresses. Follow these instructions at home:  Take over-the-counter and prescription medicines only as directed by your health care provider.  Use eye drops or cold compresses to help with discomfort as directed by your health care provider.  Avoid activities, things, and environments that may irritate or injure your eye.  Keep all follow-up visits as told by your health care provider. This is important. Contact a health care provider if:  You have pain in your eye.  The bleeding does not go away within 3 weeks.  You keep getting new subconjunctival  hemorrhages. Get help right away if:  Your vision changes or you have difficulty seeing.  You suddenly develop severe sensitivity to light.  You develop a severe headache, persistent vomiting, confusion, or abnormal tiredness (lethargy).  Your eye seems to bulge or protrude from your eye socket.  You develop unexplained bruises on your body.  You have unexplained bleeding in another area of your body. Summary  Subconjunctival hemorrhage is bleeding that happens between the white part of your eye and the clear membrane that covers the outside of your eye.  This condition is similar to a bruise.  Subconjunctival hemorrhages usually do not require treatment, and they  usually disappear on their own within two weeks.  Use eye drops or cold compresses to help with discomfort as directed by your health care provider. This information is not intended to replace advice given to you by your health care provider. Make sure you discuss any questions you have with your health care provider. Document Revised: 05/12/2019 Document Reviewed: 08/26/2018 Elsevier Patient Education  2021 [place].  Edema  Edema is an abnormal buildup of fluids in the body tissues and under the skin. Swelling of the legs, feet, and ankles is a common symptom that becomes more likely as you get older. Swelling is also common in looser tissues, like around the eyes. When the affected area is squeezed, the fluid may move out of that spot and leave a dent for a few moments. This dent is called pitting edema. There are many possible causes of edema. Eating too much salt (sodium) and being on your feet or sitting for a long time can cause edema in your legs, feet, and ankles. Hot weather may make edema worse. Common causes of edema include:  Heart failure.  Liver or kidney disease.  Weak leg blood vessels.  Cancer.  An injury.  Pregnancy.  Medicines.  Being obese.  Low protein levels in the blood. Edema is  usually painless. Your skin may look swollen or shiny. Follow these instructions at home:  Keep the affected body part raised (elevated) above the level of your heart when you are sitting or lying down.  Do not sit still or stand for long periods of time.  Do not wear tight clothing. Do not wear garters on your upper legs.  Exercise your legs to get your circulation going. This helps to move the fluid back into your blood vessels, and it may help the swelling go down.  Wear elastic bandages or support stockings to reduce swelling as told by your health care provider.  Eat a low-salt (low-sodium) diet to reduce fluid as told by your health care provider.  Depending on the cause of your swelling, you may need to limit how much fluid you drink (fluid restriction).  Take over-the-counter and prescription medicines only as told by your health care provider. Contact a health care provider if:  Your edema does not get better with treatment.  You have heart, liver, or kidney disease and have symptoms of edema.  You have sudden and unexplained weight gain. Get help right away if:  You develop shortness of breath or chest pain.  You cannot breathe when you lie down.  You develop pain, redness, or warmth in the swollen areas.  You have heart, liver, or kidney disease and suddenly get edema.  You have a fever and your symptoms suddenly get worse. Summary  Edema is an abnormal buildup of fluids in the body tissues and under the skin.  Eating too much salt (sodium) and being on your feet or sitting for a long time can cause edema in your legs, feet, and ankles.  Keep the affected body part raised (elevated) above the level of your heart when you are sitting or lying down. This information is not intended to replace advice given to you by your health care provider. Make sure you discuss any questions you have with your health care provider. Document Revised: 04/14/2019 Document Reviewed:  12/28/2016 Elsevier Patient Education  2021 [employer].

## 2021-01-17 NOTE — Progress Notes (Signed)
Chief Complaint  Patient presents with  . Follow-up   F/u  1. C/o early satiety f/u with Dr. Alice Reichert also with h/o polyps rec disc EGD/colonoscopy in the future colonoscopy due 03/14/21  2. GERd/hiatal hernia with sxs of gerd recently though no dietary changes significantly tried otc prilosec in the past tried zantac in 2017 but felt like caused diarrhea  3. H/o low cbg in the am before food 50s disc otc glucose tablet  4. Resolving left subconjunctival hemorrhage she called eye md when this happened 2 weeks ago not sure if from trauma from rubbing dry eyes but resolved this occurred 2 weeks ago  5. C/o arms, leg, facial swelling at times  6. C/o numbness in great toes prior Xray low back +multilevel L4/5 spondylosis disc consider EMG/NCS  Review of Systems  Constitutional: Negative for weight loss.  HENT: Negative for hearing loss.   Eyes: Negative for blurred vision.  Cardiovascular: Negative for chest pain.  Gastrointestinal: Positive for heartburn.       +early satiety    Musculoskeletal: Positive for falls. Negative for joint pain.       Fall x 1   Skin: Negative for rash.  Neurological: Negative for headaches.  Psychiatric/Behavioral: Negative for depression.   Past Medical History:  Diagnosis Date  . Allergy   . Arthritis   . Cataract   . Frequent headaches    seasonal  . GERD (gastroesophageal reflux disease)   . History of chicken pox   . History of colon polyps   . Hypercholesteremia   . IBS (irritable bowel syndrome)    constipation   . OSA on CPAP   . Osteoporosis   . Sleep apnea   . Tinnitus   . Venous insufficiency    Past Surgical History:  Procedure Laterality Date  . ABDOMINAL HYSTERECTOMY    . COLONOSCOPY WITH PROPOFOL N/A 03/14/2016   Procedure: COLONOSCOPY WITH PROPOFOL;  Surgeon: Hulen Luster, MD;  Location[facility] ENDOSCOPY;  Service: Gastroenterology;  Laterality: N/A;  . DILATION AND CURETTAGE OF UTERUS    . TONSILLECTOMY     Family History   Problem Relation Age of Onset  . Prostate cancer Father   . Cancer Father        prostate   . Hypertension Father   . Lung cancer Sister   . Cancer Sister        lung smoker   . Deep vein thrombosis Sister   . Pulmonary embolism Sister   . Breast cancer Maternal Aunt 52  . Cancer Maternal Aunt        breast  . Stroke Mother   . Osteoporosis Mother   . Heart disease Maternal Grandmother        CHF  . AAA (abdominal aortic aneurysm) Paternal Grandmother   . Hypertension Paternal Grandmother   . Cancer Paternal Grandfather        prostate   Social History   Socioeconomic History  . Marital status: Married    Spouse name: Not on file  . Number of children: Not on file  . Years of education: Not on file  . Highest education level: Not on file  Occupational History  . Not on file  Tobacco Use  . Smoking status: Former Research scientist (life sciences)  . Smokeless tobacco: Never Used  . Tobacco comment: age [age] light   Vaping Use  . Vaping Use: Never used  Substance and Sexual Activity  . Alcohol use: No  . Drug use: No  .  Sexual activity: Not Currently    Birth control/protection: Surgical  Other Topics Concern  . Not on file  Social History Narrative   Married, husband in house    Wears seat belt, husband owns guns, safe in relationship    No kids    Masters, previous nurse office work    No kids    Retired    Scientist, physiological Strain: [place]   . Difficulty of Paying Living Expenses: Not hard at all  Food Insecurity: No Food Insecurity  . Worried About Charity fundraiser in the Last Year: Never true  . Ran Out of Food in the Last Year: Never true  Transportation Needs: No Transportation Needs  . Lack of Transportation (Medical): No  . Lack of Transportation (Non-Medical): No  Physical Activity: Sufficiently Active  . Days of Exercise per Week: 5 days  . Minutes of Exercise per Session: 60 min  Stress: No Stress Concern Present  . Feeling of  Stress : Not at all  Social Connections: Unknown  . Frequency of Communication with Friends and Family: More than three times a week  . Frequency of Social Gatherings with Friends and Family: More than three times a week  . Attends Religious Services: Not on file  . Active Member of Clubs or Organizations: Not on file  . Attends Archivist Meetings: Not on file  . Marital Status: Not on file  Intimate Partner Violence: Not At Risk  . Fear of Current or Ex-Partner: No  . Emotionally Abused: No  . Physically Abused: No  . Sexually Abused: No   Current Meds  Medication Sig  . atorvastatin (LIPITOR) 10 MG tablet Take 1 tablet (10 mg total) by mouth daily at 6 PM.  . calcium-vitamin D (OSCAL WITH D) 500-200 MG-UNIT tablet Take 1 tablet by mouth.  . diclofenac sodium (VOLTAREN) 1 % GEL Apply topically 4 (four) times daily. Prn  . furosemide (LASIX) 40 MG tablet Take 1 tablet (40 mg total) by mouth daily as needed. In am  . metroNIDAZOLE (METROCREAM) 0.75 % cream APPLY TOPICALLY TO FACE TWICE A DAY  . Multiple Vitamin (MULTIVITAMIN) tablet Take 1 tablet by mouth daily.  . polyethylene glycol powder (GLYCOLAX/MIRALAX) powder Take by mouth.  . RESTASIS 0.05 % ophthalmic emulsion PLACE 1 DROP INTO BOTH EYES TWICE A DAY  . triamcinolone cream (KENALOG) 0.1 % Apply 1 application topically 2 (two) times daily. Prn legs b/l   Allergies  Allergen Reactions  . Bactrim [Sulfamethoxazole-Trimethoprim] Hives  . Codeine Other (See Comments)    Nervous system reaction insides coming out through skin  . Compazine [Prochlorperazine Edisylate] Other (See Comments)  . Demerol [Meperidine] Other (See Comments)    Nervous system reaction insides coming out through skin  . Indocin [Indomethacin] Other (See Comments)  . Other     Onions and garlic    . Reglan [Metoclopramide] Other (See Comments)   Recent Results (from the past 2160 hour(s))  ECHOCARDIOGRAM COMPLETE     Status: None    Collection Time: 11/07/20 10:47 AM  Result Value Ref Range   S' Lateral 2.70 cm   Area-P 1/2 2.93 cm2  CBC w/Diff     Status: None   Collection Time: 01/11/21  9:16 AM  Result Value Ref Range   WBC 5.6 4.0 - 10.5 K/uL   RBC 4.33 3.87 - 5.11 Mil/uL   Hemoglobin 13.4 12.0 - 15.0 g/dL   HCT 39.5  36.0 - 46.0 %   MCV 91.1 78.0 - 100.0 fl   MCHC 33.8 30.0 - 36.0 g/dL   RDW 13.5 11.5 - 15.5 %   Platelets 238.0 150.0 - 400.0 K/uL   Neutrophils Relative % 49.5 43.0 - 77.0 %   Lymphocytes Relative 39.5 12.0 - 46.0 %   Monocytes Relative 7.4 3.0 - 12.0 %   Eosinophils Relative 2.5 0.0 - 5.0 %   Basophils Relative 1.1 0.0 - 3.0 %   Neutro Abs 2.7 1.4 - 7.7 K/uL   Lymphs Abs 2.2 0.7 - 4.0 K/uL   Monocytes Absolute 0.4 0.1 - 1.0 K/uL   Eosinophils Absolute 0.1 0.0 - 0.7 K/uL   Basophils Absolute 0.1 0.0 - 0.1 K/uL  Comprehensive metabolic panel     Status: None   Collection Time: 01/11/21  9:16 AM  Result Value Ref Range   Sodium 140 135 - 145 mEq/L   Potassium 4.3 3.5 - 5.1 mEq/L   Chloride 104 96 - 112 mEq/L   CO2 31 19 - 32 mEq/L   Glucose, Bld 93 70 - 99 mg/dL   BUN 20 6 - 23 mg/dL   Creatinine, Ser 0.92 0.40 - 1.20 mg/dL   Total Bilirubin 0.6 0.2 - 1.2 mg/dL   Alkaline Phosphatase 69 39 - 117 U/L   AST 14 0 - 37 U/L   ALT 15 0 - 35 U/L   Total Protein 6.7 6.0 - 8.3 g/dL   Albumin 4.2 3.5 - 5.2 g/dL   GFR 61.00 >60.00 mL/min    Comment: Calculated using the CKD-EPI Creatinine Equation (2021)   Calcium 9.5 8.4 - 10.5 mg/dL  Lipid panel     Status: Abnormal   Collection Time: 01/11/21  9:16 AM  Result Value Ref Range   Cholesterol 202 (H) 0 - 200 mg/dL    Comment: ATP III Classification       Desirable:  < 200 mg/dL               Borderline High:  200 - 239 mg/dL          High:  > = 240 mg/dL   Triglycerides 120.0 0.0 - 149.0 mg/dL    Comment: Normal:  <150 mg/dLBorderline High:  150 - 199 mg/dL   HDL 63.30 >39.00 mg/dL   VLDL 24.0 0.0 - 40.0 mg/dL   LDL Cholesterol 115 (H) 0  - 99 mg/dL   Total CHOL/HDL Ratio 3     Comment:                Men          Women1/2 Average Risk     3.4          3.3Average Risk          5.0          4.42X Average Risk          9.6          7.13X Average Risk          15.0          11.0                       NonHDL 138.60     Comment: NOTE:  Non-HDL goal should be 30 mg/dL higher than patient's LDL goal (i.e. LDL goal of < 70 mg/dL, would have non-HDL goal of < 100 mg/dL)   Objective  Body mass index is  25.47 kg/m. Wt Readings from Last 3 Encounters:  01/17/21 130 lb 6.4 oz (59.1 kg)  01/02/21 130 lb (59 kg)  11/13/20 130 lb (59 kg)   Temp Readings from Last 3 Encounters:  01/17/21 98.4 F (36.9 C) (Oral)  07/13/20 98.2 F (36.8 C) (Oral)  04/06/20 (!) 97.1 F (36.2 C) (Temporal)   BP Readings from Last 3 Encounters:  01/17/21 120/68  11/13/20 124/62  07/13/20 116/78   Pulse Readings from Last 3 Encounters:  01/17/21 (!) 105  11/13/20 84  07/13/20 (!) 105    Physical Exam Vitals and nursing note reviewed.  Constitutional:      Appearance: Normal appearance. She is well-developed, well-groomed and overweight.  HENT:     Head: Normocephalic and atraumatic.  Eyes:     Conjunctiva/sclera: Conjunctivae normal.     Pupils: Pupils are equal, round, and reactive to light.  Cardiovascular:     Rate and Rhythm: Normal rate and regular rhythm.     Heart sounds: Normal heart sounds. No murmur heard.   Pulmonary:     Effort: Pulmonary effort is normal.     Breath sounds: Normal breath sounds.  Abdominal:     Tenderness: There is no abdominal tenderness.  Skin:    General: Skin is warm and dry.  Neurological:     General: No focal deficit present.     Mental Status: She is alert and oriented to person, place, and time. Mental status is at baseline.     Gait: Gait normal.  Psychiatric:        Attention and Perception: Attention and perception normal.        Mood and Affect: Mood and affect normal.        Speech:  Speech normal.        Behavior: Behavior normal. Behavior is cooperative.        Thought Content: Thought content normal.        Cognition and Memory: Cognition and memory normal.        Judgment: Judgment normal.     Assessment  Plan   Facial swelling B/l arm swelling Lymphedema sxs x 2 years Disc otc compression stockings copp[employer]  ABI done in 06/14/2019 negative lower extremity etiology ABIs normal and h/o capillaritis with vascular in [place] Dr. Trula Slade -consider vascular f/u  -rec EGD due to early satiety -no evidence of liver, heart, renal failure  ANA negative, ESR and CRP -consider amyloid, RF, CCP w/u in the future   Early satiety Gastroesophageal reflux disease without esophagitis Hiatal hernia H/o colon polyps  -f/u sch Dr. Alice Reichert 02/2021 to disc egd/colonoscopy  Hypoglycemia Disc otc glucose tablets when does not eat large breakfast   Subconjunctival hemorrhage of left eye Monitor BP Etiology ? Trauma from rubbing    Numbness of toes  Disc emg/ncs with neurology pt to let me know  HM Flu shot utd prevnar utd, utd pna 23 vaccine  Tdap had 08/16/16 Had zostervax  covid 3/3 shingrix vaccine per pt had 2/2   Out of age window for pap s/p hysterectomy 1984 for fibroids 1 ovary intact  Mammogram neg 9/28/20neg10/4/21 negative ordered   DEXA 09/03/16 +osteoporosis was on oral bisphonates and had jaw necrosis declines other meds for now I.e prolia discussedprev. rec calcium 600 mg 1-2 x per day and D3 1000-2000 IU daily Declines meds disc strontium supplement otc  Colonoscopy h/o polyps last [place] Dr. Candace Cruise diverticulosis 03/14/16 bxs negativewill see if needed in 2022 h/o polyps pt  does not know type -appt 02/2021 Dr. Alice Reichert with h/o polyps and having early satiety rec EGD and colonoscopy   Dermatology Dr. Kellie Moor Aks right cheek LN2 saw 2019 f/u 08/2021   Former smoker age [age] light smoker   Eye Dr. Merla Riches saw 12/20/19sch 11/2019 Dentist  Dr. Kendell Bane  GI Dr. Alice Reichert vascular Dr. Trula Slade Provider: Dr. Olivia Mackie McLean-Scocuzza-Internal Medicine

## 2021-02-08 DIAGNOSIS — Z860100 Personal history of colon polyps, unspecified: Secondary | ICD-10-CM | POA: Insufficient documentation

## 2021-02-08 DIAGNOSIS — R1319 Other dysphagia: Secondary | ICD-10-CM | POA: Diagnosis not present

## 2021-02-08 DIAGNOSIS — K5909 Other constipation: Secondary | ICD-10-CM | POA: Diagnosis not present

## 2021-02-08 DIAGNOSIS — K219 Gastro-esophageal reflux disease without esophagitis: Secondary | ICD-10-CM | POA: Diagnosis not present

## 2021-02-08 IMAGING — MG MM DIGITAL SCREENING BILAT W/ TOMO W/ CAD
8 series · 8 of 24 positions shown · non-contrast
Comparison: Previous exam(s).

ACR Breast Density Category a: The breast tissue is almost entirely
fatty.

CLINICAL DATA: Screening.

EXAM:
DIGITAL SCREENING BILATERAL MAMMOGRAM WITH TOMO AND CAD

[L MLO synth-2D]
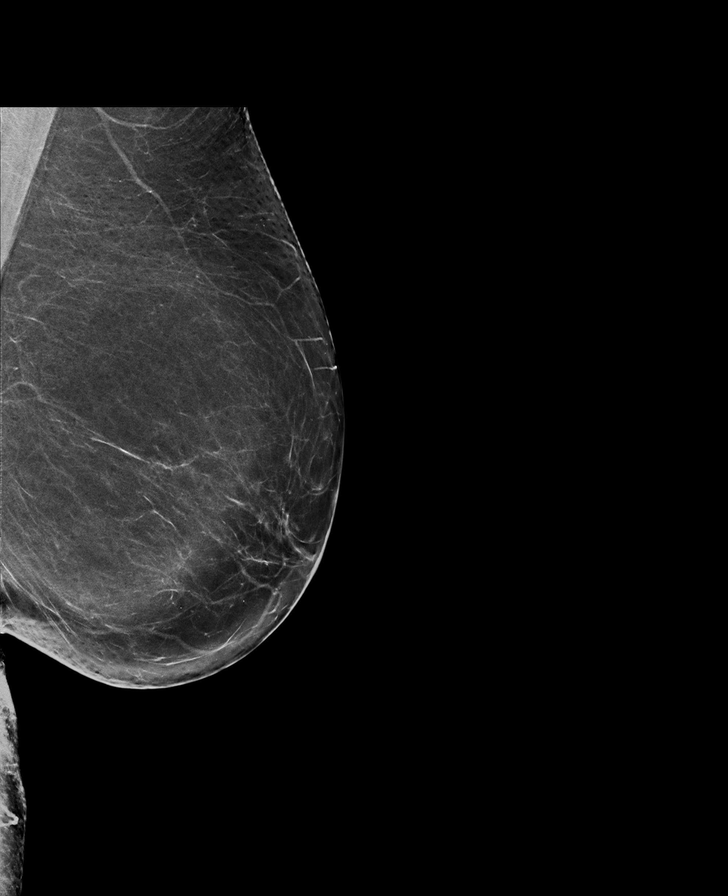

[L CC synth-2D]
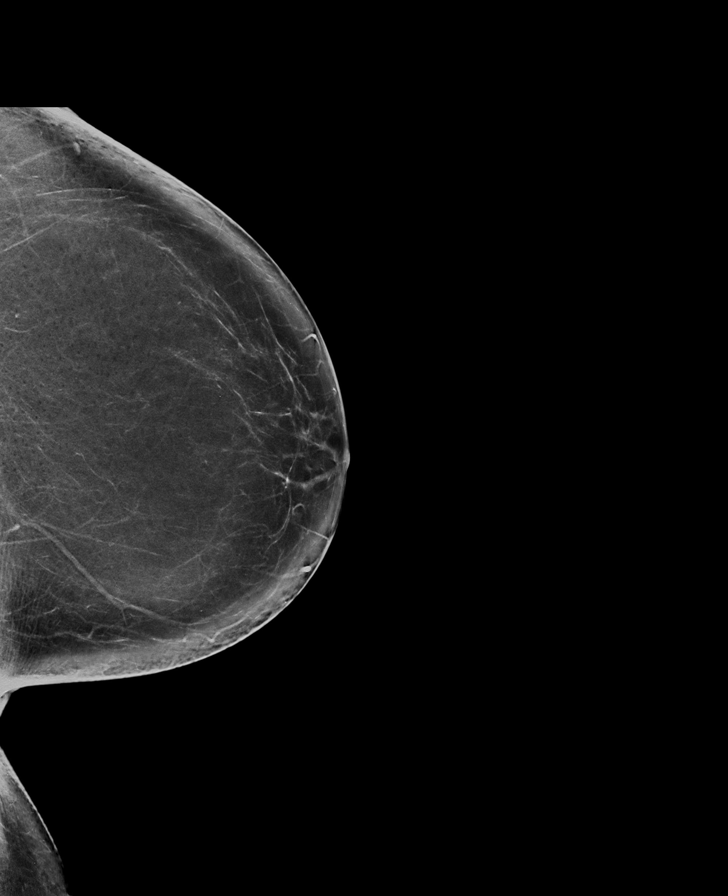

[R CC synth-2D]
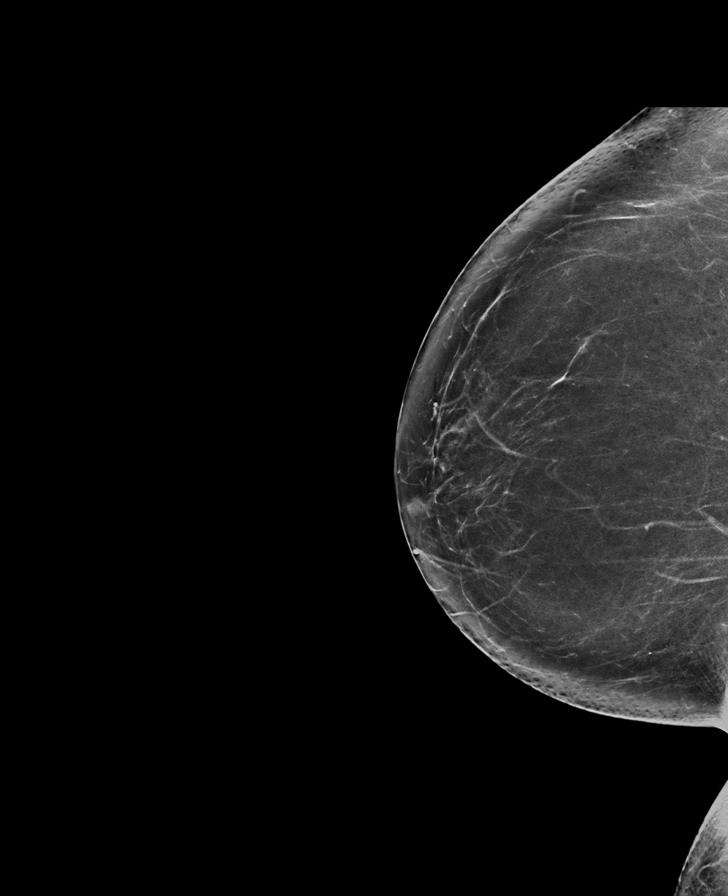

[R MLO synth-2D]
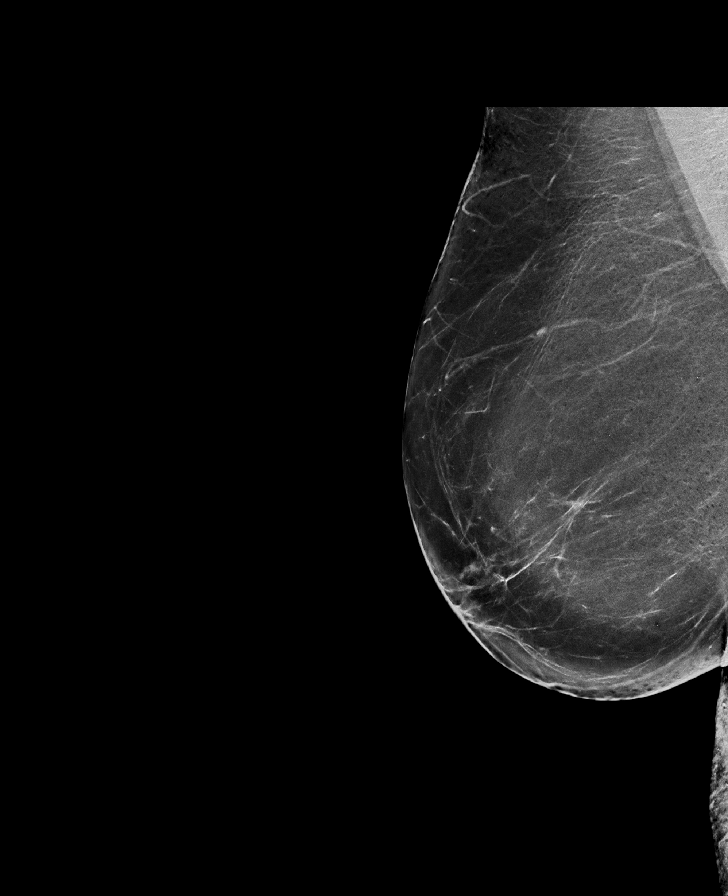

[R MLO tomo · tomo slice 43/85.0]
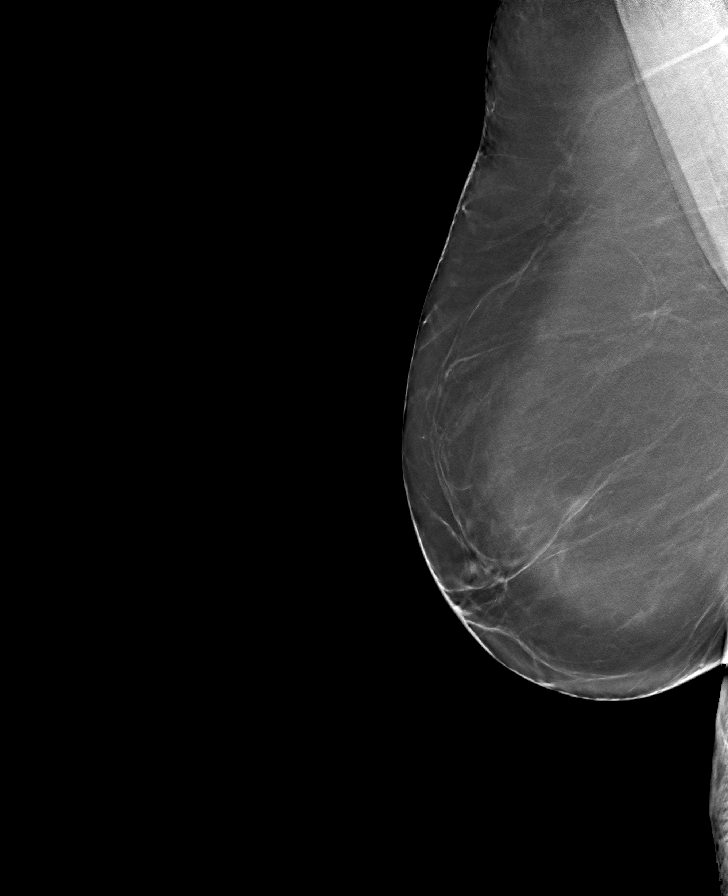

[L MLO tomo · tomo slice 39/78.0]
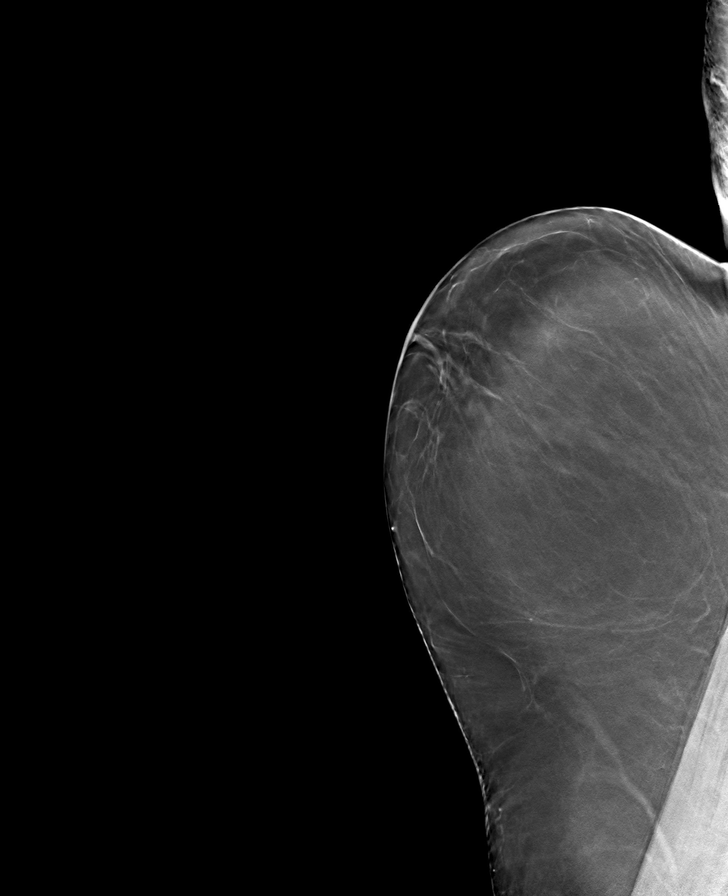

[L CC tomo · tomo slice 43/86.0]
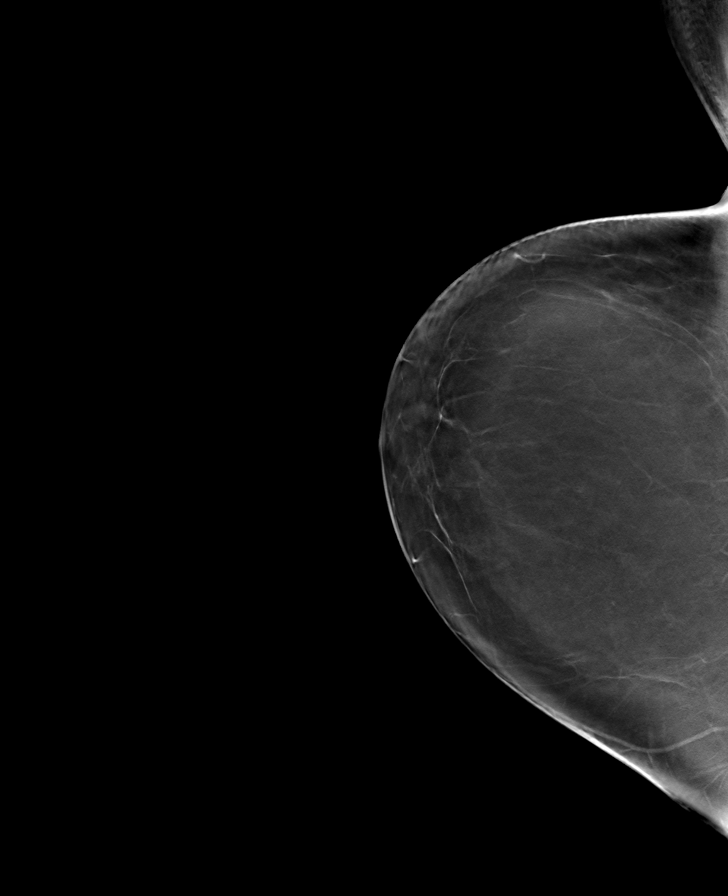

[R CC tomo · tomo slice 40/79.0]
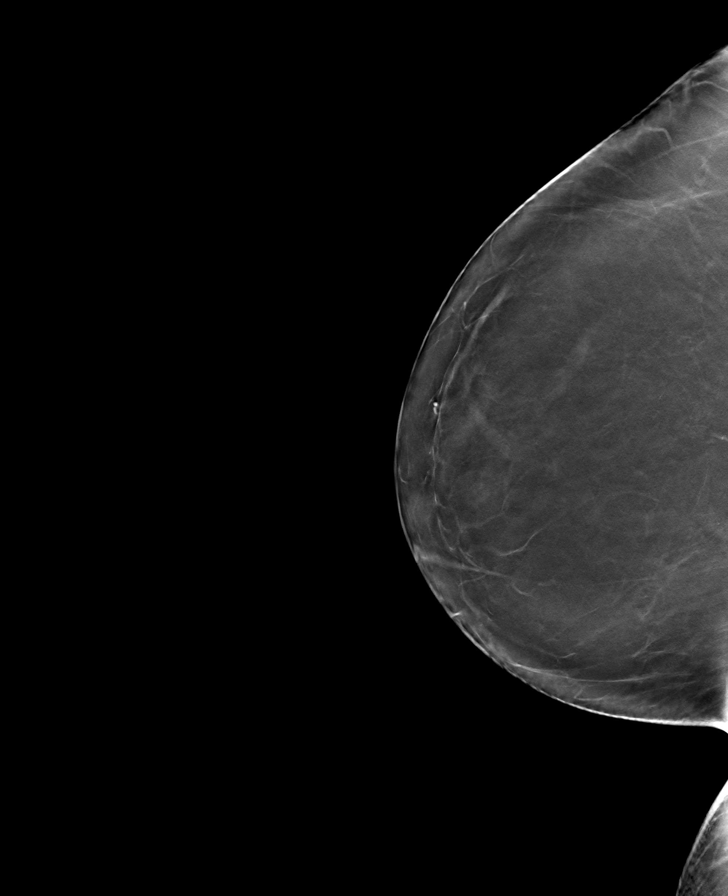

[8 of 24 positions shown; findings below may reference images not displayed]

FINDINGS: There are no findings suspicious for malignancy. Images were
processed with CAD.
IMPRESSION: No mammographic evidence of malignancy. A result letter of this
screening mammogram will be mailed directly to the patient.

RECOMMENDATION:
Screening mammogram in one year. (Code:[id number])

BI-RADS CATEGORY  1: Negative.

## 2021-02-21 DIAGNOSIS — Z01818 Encounter for other preprocedural examination: Secondary | ICD-10-CM | POA: Diagnosis not present

## 2021-02-26 DIAGNOSIS — K219 Gastro-esophageal reflux disease without esophagitis: Secondary | ICD-10-CM | POA: Diagnosis not present

## 2021-02-26 DIAGNOSIS — Z86010 Personal history of colonic polyps: Secondary | ICD-10-CM | POA: Diagnosis not present

## 2021-02-26 DIAGNOSIS — K2970 Gastritis, unspecified, without bleeding: Secondary | ICD-10-CM | POA: Diagnosis not present

## 2021-02-26 DIAGNOSIS — R6881 Early satiety: Secondary | ICD-10-CM | POA: Diagnosis not present

## 2021-02-26 DIAGNOSIS — K6389 Other specified diseases of intestine: Secondary | ICD-10-CM | POA: Diagnosis not present

## 2021-02-26 DIAGNOSIS — R197 Diarrhea, unspecified: Secondary | ICD-10-CM | POA: Diagnosis not present

## 2021-02-26 DIAGNOSIS — K640 First degree hemorrhoids: Secondary | ICD-10-CM | POA: Diagnosis not present

## 2021-02-26 DIAGNOSIS — K222 Esophageal obstruction: Secondary | ICD-10-CM | POA: Diagnosis not present

## 2021-02-26 DIAGNOSIS — Z1211 Encounter for screening for malignant neoplasm of colon: Secondary | ICD-10-CM | POA: Diagnosis not present

## 2021-02-26 DIAGNOSIS — K5730 Diverticulosis of large intestine without perforation or abscess without bleeding: Secondary | ICD-10-CM | POA: Diagnosis not present

## 2021-02-26 DIAGNOSIS — R1310 Dysphagia, unspecified: Secondary | ICD-10-CM | POA: Diagnosis not present

## 2021-02-26 DIAGNOSIS — D123 Benign neoplasm of transverse colon: Secondary | ICD-10-CM | POA: Diagnosis not present

## 2021-02-26 DIAGNOSIS — K635 Polyp of colon: Secondary | ICD-10-CM | POA: Diagnosis not present

## 2021-02-26 DIAGNOSIS — K449 Diaphragmatic hernia without obstruction or gangrene: Secondary | ICD-10-CM | POA: Diagnosis not present

## 2021-02-27 LAB — HM COLONOSCOPY

## 2021-03-01 ENCOUNTER — Encounter: Payer: Self-pay | Admitting: Internal Medicine

## 2021-03-26 ENCOUNTER — Encounter: Payer: Self-pay | Admitting: Internal Medicine

## 2021-03-27 ENCOUNTER — Other Ambulatory Visit: Payer: Self-pay | Admitting: Internal Medicine

## 2021-03-27 ENCOUNTER — Ambulatory Visit
Admission: RE | Admit: 2021-03-27 | Discharge: 2021-03-27 | Disposition: A | Discharge status: Home / Self Care | Payer: Medicare PPO | Source: Ambulatory Visit | Attending: Internal Medicine | Admitting: Internal Medicine

## 2021-03-27 DIAGNOSIS — M542 Cervicalgia: Secondary | ICD-10-CM | POA: Insufficient documentation

## 2021-03-27 NOTE — Addendum Note (Signed)
Addended by: Orland Mustard on: 03/27/2021 08:10 AM   Modules accepted: Orders

## 2021-03-28 ENCOUNTER — Encounter: Payer: Self-pay | Admitting: Internal Medicine

## 2021-03-28 NOTE — Telephone Encounter (Signed)
-----   Message from Delorise Jackson, MD sent at 03/28/2021  9:09 AM EDT ----- Arthritis at multiple levels  Do you want to do PT?

## 2021-04-02 DIAGNOSIS — M47816 Spondylosis without myelopathy or radiculopathy, lumbar region: Secondary | ICD-10-CM

## 2021-04-02 DIAGNOSIS — M47812 Spondylosis without myelopathy or radiculopathy, cervical region: Secondary | ICD-10-CM

## 2021-04-02 HISTORY — DX: Spondylosis without myelopathy or radiculopathy, lumbar region: M47.816

## 2021-04-02 HISTORY — DX: Spondylosis without myelopathy or radiculopathy, cervical region: M47.812

## 2021-04-02 NOTE — Addendum Note (Signed)
Addended by: Orland Mustard on: 04/02/2021 02:25 PM   Modules accepted: Orders

## 2021-04-10 ENCOUNTER — Telehealth: Payer: Self-pay | Admitting: Internal Medicine

## 2021-04-10 NOTE — Telephone Encounter (Signed)
[facility] called to let you know that they did receive the referral

## 2021-04-12 ENCOUNTER — Other Ambulatory Visit: Payer: Self-pay | Admitting: Internal Medicine

## 2021-04-12 DIAGNOSIS — R600 Localized edema: Secondary | ICD-10-CM

## 2021-04-20 DIAGNOSIS — M542 Cervicalgia: Secondary | ICD-10-CM | POA: Diagnosis not present

## 2021-04-23 DIAGNOSIS — M542 Cervicalgia: Secondary | ICD-10-CM | POA: Diagnosis not present

## 2021-04-25 DIAGNOSIS — M542 Cervicalgia: Secondary | ICD-10-CM | POA: Diagnosis not present

## 2021-04-30 DIAGNOSIS — M542 Cervicalgia: Secondary | ICD-10-CM | POA: Diagnosis not present

## 2021-05-09 DIAGNOSIS — M542 Cervicalgia: Secondary | ICD-10-CM | POA: Diagnosis not present

## 2021-06-01 ENCOUNTER — Encounter: Payer: Self-pay | Admitting: Internal Medicine

## 2021-06-11 MED ORDER — SCOPOLAMINE 1 MG/3DAYS TD PT72: 1.0000 | MEDICATED_PATCH | TRANSDERMAL | 0 refills | Status: DC

## 2021-06-11 NOTE — Addendum Note (Signed)
Addended by: Orland Mustard on: 06/11/2021 10:23 PM   Modules accepted: Orders

## 2021-06-11 NOTE — Telephone Encounter (Signed)
Please place on my desk to sign Rx on printer thanks Can call pt to pick up

## 2021-06-25 ENCOUNTER — Encounter: Payer: Self-pay | Admitting: Internal Medicine

## 2021-06-25 NOTE — Telephone Encounter (Signed)
Please advise, what labs would you liked ordered?  Last labs 01/11/21.

## 2021-06-26 DIAGNOSIS — R200 Anesthesia of skin: Secondary | ICD-10-CM | POA: Diagnosis not present

## 2021-06-26 DIAGNOSIS — G609 Hereditary and idiopathic neuropathy, unspecified: Secondary | ICD-10-CM | POA: Diagnosis not present

## 2021-06-26 DIAGNOSIS — Z79899 Other long term (current) drug therapy: Secondary | ICD-10-CM | POA: Diagnosis not present

## 2021-06-26 DIAGNOSIS — M810 Age-related osteoporosis without current pathological fracture: Secondary | ICD-10-CM | POA: Diagnosis not present

## 2021-06-26 LAB — HEMOGLOBIN A1C: Hemoglobin A1C: 5.9

## 2021-06-27 NOTE — Telephone Encounter (Signed)
Please advise, labs done yesterday are in the Patient's chart

## 2021-06-28 ENCOUNTER — Encounter: Payer: Self-pay | Admitting: Internal Medicine

## 2021-06-28 NOTE — Telephone Encounter (Signed)
Please advise, labs in Patient chart

## 2021-07-05 DIAGNOSIS — R899 Unspecified abnormal finding in specimens from other organs, systems and tissues: Secondary | ICD-10-CM | POA: Diagnosis not present

## 2021-07-09 ENCOUNTER — Encounter: Payer: Self-pay | Admitting: Internal Medicine

## 2021-07-09 ENCOUNTER — Other Ambulatory Visit: Payer: Self-pay | Admitting: Internal Medicine

## 2021-07-09 DIAGNOSIS — D472 Monoclonal gammopathy: Secondary | ICD-10-CM | POA: Insufficient documentation

## 2021-07-09 DIAGNOSIS — R944 Abnormal results of kidney function studies: Secondary | ICD-10-CM | POA: Insufficient documentation

## 2021-07-09 DIAGNOSIS — R7303 Prediabetes: Secondary | ICD-10-CM | POA: Insufficient documentation

## 2021-07-09 DIAGNOSIS — R778 Other specified abnormalities of plasma proteins: Secondary | ICD-10-CM | POA: Insufficient documentation

## 2021-07-09 DIAGNOSIS — E785 Hyperlipidemia, unspecified: Secondary | ICD-10-CM

## 2021-07-09 HISTORY — DX: Abnormal results of kidney function studies: R94.4

## 2021-07-09 HISTORY — DX: Hypercalcemia: E83.52

## 2021-07-10 NOTE — Telephone Encounter (Signed)
For your information  

## 2021-07-11 ENCOUNTER — Other Ambulatory Visit: Payer: Self-pay

## 2021-07-11 ENCOUNTER — Ambulatory Visit (INDEPENDENT_AMBULATORY_CARE_PROVIDER_SITE_OTHER): Payer: Medicare PPO | Admitting: Internal Medicine

## 2021-07-11 ENCOUNTER — Encounter: Payer: Self-pay | Admitting: Internal Medicine

## 2021-07-11 VITALS — BP 134/82 | HR 112 | Temp 96.4°F | Ht 60.0 in | Wt 133.8 lb

## 2021-07-11 DIAGNOSIS — E785 Hyperlipidemia, unspecified: Secondary | ICD-10-CM | POA: Diagnosis not present

## 2021-07-11 DIAGNOSIS — C9000 Multiple myeloma not having achieved remission: Secondary | ICD-10-CM | POA: Diagnosis not present

## 2021-07-11 DIAGNOSIS — D472 Monoclonal gammopathy: Secondary | ICD-10-CM | POA: Diagnosis not present

## 2021-07-11 DIAGNOSIS — N179 Acute kidney failure, unspecified: Secondary | ICD-10-CM | POA: Diagnosis not present

## 2021-07-11 DIAGNOSIS — R7303 Prediabetes: Secondary | ICD-10-CM | POA: Diagnosis not present

## 2021-07-11 MED ORDER — ATORVASTATIN CALCIUM 10 MG PO TABS: 10.0000 mg | ORAL_TABLET | Freq: Every day | ORAL | 3 refills | Status: DC

## 2021-07-11 MED ORDER — ATORVASTATIN CALCIUM 10 MG PO TABS
10.0000 mg | ORAL_TABLET | Freq: Every day | ORAL | 3 refills | Status: DC
Start: 2021-07-11 — End: 2022-05-24

## 2021-07-11 NOTE — Progress Notes (Signed)
Chief Complaint  Patient presents with   Follow-up   F/u  1. M spike 0.6,  hyperca 10.4. GFR 48 from 62   Review of Systems  Constitutional:  Negative for malaise/fatigue and weight loss.       Denies hot flashes   HENT:  Negative for hearing loss.   Eyes:  Negative for blurred vision.  Respiratory:  Negative for shortness of breath.   Cardiovascular:  Negative for chest pain.  Gastrointestinal:  Negative for abdominal pain.  Musculoskeletal:  Positive for falls. Negative for joint pain.  Skin:  Negative for rash.  Neurological:  Positive for sensory change. Negative for headaches.  Psychiatric/Behavioral:  Negative for depression.   Past Medical History:  Diagnosis Date   Allergy    Arthritis    Cataract    Frequent headaches    seasonal   GERD (gastroesophageal reflux disease)    History of chicken pox    History of colon polyps    Hypercholesteremia    IBS (irritable bowel syndrome)    constipation    OSA on CPAP    Osteoporosis    Sleep apnea    Tinnitus    Venous insufficiency    Past Surgical History:  Procedure Laterality Date   ABDOMINAL HYSTERECTOMY     COLONOSCOPY WITH PROPOFOL N/A 03/14/2016   Procedure: COLONOSCOPY WITH PROPOFOL;  Surgeon: Hulen Luster, MD;  Location: [facility] ENDOSCOPY;  Service: Gastroenterology;  Laterality: N/A;   DILATION AND CURETTAGE OF UTERUS     TONSILLECTOMY     Family History  Problem Relation Age of Onset   Prostate cancer Father    Cancer Father        prostate    Hypertension Father    Lung cancer Sister    Cancer Sister        lung smoker    Deep vein thrombosis Sister    Pulmonary embolism Sister    Breast cancer Maternal Aunt 23   Cancer Maternal Aunt        breast   Stroke Mother    Osteoporosis Mother    Heart disease Maternal Grandmother        CHF   AAA (abdominal aortic aneurysm) Paternal Grandmother    Hypertension Paternal Grandmother    Cancer Paternal Grandfather        prostate   Social History    Socioeconomic History   Marital status: Married    Spouse name: Not on file   Number of children: Not on file   Years of education: Not on file   Highest education level: Not on file  Occupational History   Not on file  Tobacco Use   Smoking status: Former   Smokeless tobacco: Never   Tobacco comments:    age [age] light   Vaping Use   Vaping Use: Never used  Substance and Sexual Activity   Alcohol use: No   Drug use: No   Sexual activity: Not Currently    Birth control/protection: Surgical  Other Topics Concern   Not on file  Social History Narrative   Married, husband in house    Wears seat belt, husband owns guns, safe in relationship    No kids    Masters, previous nurse office work    No kids    Retired    Scientist, physiological Strain: Low Risk    Difficulty of Paying Living Expenses: Not hard at [place]: No  Food Insecurity   Worried About Charity fundraiser in the Last Year: Never true   Ran Out of Food in the Last Year: Never true  Transportation Needs: No Transportation Needs   Lack of Transportation (Medical): No   Lack of Transportation (Non-Medical): No  Physical Activity: Sufficiently Active   Days of Exercise per Week: 5 days   Minutes of Exercise per Session: 60 min  Stress: No Stress Concern Present   Feeling of Stress : Not at all  Social Connections: Unknown   Frequency of Communication with Friends and Family: More than three times a week   Frequency of Social Gatherings with Friends and Family: More than three times a week   Attends Religious Services: Not on Electrical engineer or Organizations: Not on file   Attends Archivist Meetings: Not on file   Marital Status: Not on file  Intimate Partner Violence: Not At Risk   Fear of Current or Ex-Partner: No   Emotionally Abused: No   Physically Abused: No   Sexually Abused: No   Current Meds  Medication Sig   calcium-vitamin  D (OSCAL WITH D) 500-200 MG-UNIT tablet Take 1 tablet by mouth.   diclofenac sodium (VOLTAREN) 1 % GEL Apply topically 4 (four) times daily. Prn   furosemide (LASIX) 40 MG tablet TAKE 1 TABLET (40 MG TOTAL) BY MOUTH EVERY MORNING AS NEEDED.   metroNIDAZOLE (METROCREAM) 0.75 % cream APPLY TOPICALLY TO FACE TWICE A DAY   Multiple Vitamin (MULTIVITAMIN) tablet Take 1 tablet by mouth daily.   polyethylene glycol powder (GLYCOLAX/MIRALAX) powder Take by mouth.   RESTASIS 0.05 % ophthalmic emulsion PLACE 1 DROP INTO BOTH EYES TWICE A DAY   scopolamine (TRANSDERM-SCOP) 1 MG/3DAYS Place 1 patch (1.5 mg total) onto the skin every 3 (three) days. Apply behind 1 ear 4 hours prior to cruise   triamcinolone cream (KENALOG) 0.1 % Apply 1 application topically 2 (two) times daily. Prn legs b/l   [DISCONTINUED] atorvastatin (LIPITOR) 10 MG tablet Take 1 tablet (10 mg total) by mouth daily at 6 PM.   Allergies  Allergen Reactions   Bactrim [Sulfamethoxazole-Trimethoprim] Hives   Codeine Other (See Comments)    Nervous system reaction insides coming out through skin   Compazine [Prochlorperazine Edisylate] Other (See Comments)   Demerol [Meperidine] Other (See Comments)    Nervous system reaction insides coming out through skin   Indocin [Indomethacin] Other (See Comments)   Other     Onions and garlic     Reglan [Metoclopramide] Other (See Comments)   Recent Results (from the past 2160 hour(s))  Hemoglobin A1c     Status: None   Collection Time: 06/26/21 12:00 AM  Result Value Ref Range   Hemoglobin A1C 5.9    Objective  Body mass index is 26.13 kg/m. Wt Readings from Last 3 Encounters:  07/11/21 133 lb 12.8 oz (60.7 kg)  01/17/21 130 lb 6.4 oz (59.1 kg)  01/02/21 130 lb (59 kg)   Temp Readings from Last 3 Encounters:  07/11/21 (!) 96.4 F (35.8 C) (Temporal)  01/17/21 98.4 F (36.9 C) (Oral)  07/13/20 98.2 F (36.8 C) (Oral)   BP Readings from Last 3 Encounters:  07/11/21 134/82   01/17/21 120/68  11/13/20 124/62   Pulse Readings from Last 3 Encounters:  07/11/21 (!) 112  01/17/21 (!) 105  11/13/20 84   Total IgG - [facility] 586 - 1602 mg/dL 1,008    Immunoglobulin A, Qn,  Serum - [facility] 64 - 422 mg/dL 78    IgM - [facility] 26 - 217 mg/dL 28    Protein Total - [facility] 6.0 - 8.5 g/dL 7.1    Albumin - [facility] 2.9 - 4.4 g/dL 4    Alpha-1-Globulin - [facility] 0.0 - 0.4 g/dL 0.2    Alpha-2-Globulin - [facility] 0.4 - 1.0 g/dL 0.9    Beta Globulin - [facility] 0.7 - 1.3 g/dL 1.1    Gamma Globulin - [facility] 0.4 - 1.8 g/dL 1    M-Spike - [facility] Not Observed g/dL 0.6 High     Globulin, Total - [facility] 2.2 - 3.9 g/dL 3.1    A/G Ratio - [facility] 0.7 - 1.7 1.3    IFE 1 - [facility]  Comment Abnormal      CHEM PROFILE [place] 06/26/2021 Component    Glucose  Sodium  Potassium  Chloride  Carbon Dioxide (CO2)  Calcium  Urea Nitrogen (BUN)  Creatinine  Glomerular Filtration Rate (eGFR), MDRD Estimate  BUN/Crea Ratio  Anion Gap w/K  AST   ALT   Alk Phos (alkaline Phosphatase)  Albumin  Bilirubin, Total  Protein, Total  A/G Ratio   Component 06/26/2021 08/25/2018        Glucose 90 89 Load older lab results  Sodium 141 141 Load older lab results  Potassium 4.1 4.4 Load older lab results  Chloride 102 105 Load older lab results  Carbon Dioxide (CO2) 31.3 31.6 Load older lab results  Calcium 10.4 High    9.3 Load older lab results  Urea Nitrogen (BUN) 21 16 Load older lab results  Creatinine 1.1 0.9 Load older lab results  Glomerular Filtration Rate (eGFR), MDRD Estimate 48 Low    62 Load older lab results  BUN/Crea Ratio -- -- Load older lab results  Anion Gap w/K -- -- Load older lab results  AST  14 15 Load     Physical Exam Vitals and nursing note reviewed.  Constitutional:      Appearance: Normal appearance. She is well-developed and well-groomed. She is obese.  HENT:     Head: Normocephalic and atraumatic.  Eyes:      Conjunctiva/sclera: Conjunctivae normal.     Pupils: Pupils are equal, round, and reactive to light.  Cardiovascular:     Rate and Rhythm: Normal rate and regular rhythm.     Heart sounds: Normal heart sounds. No murmur heard. Pulmonary:     Effort: Pulmonary effort is normal.     Breath sounds: Normal breath sounds.  Skin:    General: Skin is warm and dry.  Neurological:     General: No focal deficit present.     Mental Status: She is alert and oriented to person, place, and time. Mental status is at baseline.     Gait: Gait normal.  Psychiatric:        Attention and Perception: Attention and perception normal.        Mood and Affect: Mood and affect normal.        Speech: Speech normal.        Behavior: Behavior normal. Behavior is cooperative.        Thought Content: Thought content normal.        Cognition and Memory: Cognition and memory normal.        Judgment: Judgment normal.    Assessment  Plan  Multiple myeloma, remission status unspecified ([place]) - Plan: Ambulatory referral to Hematology / Oncology AKI (acute kidney injury) (  [place]) - Plan: Ambulatory referral to Hematology / Oncology  Hypercalcemia - Plan: Ambulatory referral to Hematology / Oncology  Gammopathy with multiple M spikes - Plan: Ambulatory referral to Hematology / Oncology  Monoclonal gammopathy of unknown significance ([facility]) - Plan: Ambulatory referral to Hematology / Oncology  Prediabetes Rec healthy diet and exercise   Hyperlipidemia, unspecified hyperlipidemia type - Plan:lipitor 10 mg qhs check lipid fasting next appt  Provider: Dr. Olivia Mackie McLean-Scocuzza-Internal Medicine

## 2021-07-11 NOTE — Patient Instructions (Addendum)
Dr. Silva Bandy, Dr. Varney Biles  [place] [address]  [phone]  Multiple Myeloma  Multiple myeloma is a form of cancer. It develops when abnormal plasma cells grow out of control. Plasma cells are a type of white blood cell that is made in the soft tissue inside the bones (bone marrow). They are part of the body's disease-fighting system (immune system). Multiple myeloma damages bones and causes other health problems because of its effect on blood cells. Abnormal plasma cells produce monoclonal proteins (M proteins) and interfere with many important functions that normal cells perform in the body. The disease gets worse over time (progresses) and reduces the body's ability to fight infections. What are the causes? The cause of multiple myeloma is not known. What increases the risk? You are more likely to develop this condition if you: Are older than age 1. Are female. Are African American. Have a family history of multiple myeloma. Have a history of monoclonal gammopathy of undetermined significance (MGUS). Have a history of radiation exposure. Have been exposed to certain chemicals, such as benzene or pesticides. What are the signs or symptoms? Signs and symptoms of multiple myeloma may include: Bone pain, especially in the back, ribs, and hips. Broken bones (fractures). Having a low level of red blood cells (anemia), white blood cells (leukopenia), and platelets (thrombocytopenia). Platelets are cells that help blood to clot so a wound does not keep bleeding. Fatigue. Weakness. Infections. Unusual bleeding, such as: Bleeding from the nose or gums. Bleeding a lot from a small scrape or cut. High blood calcium levels. Increased urination. Confusion. Shortness of breath. Weakness or numbness in your legs. Sudden, severe back pain. How is this diagnosed? This condition is diagnosed based on your symptoms, your medical history, and a physical exam. You  will have blood and urine tests to confirm that M proteinsare present. You may also have other tests, including: Additional blood tests. X-rays. MRI. CT scan. PET scan. Tests to check the function of your kidneys. Heart tests, such as an echocardiogram. An echocardiogram uses sound waves to produce an image of the heart. A procedure to remove a sample of bone marrow (bone marrow biopsy). The sample is examined for abnormal plasma cells. How is this treated? There is no cure for multiple myeloma. However, treatments can manage symptoms and slow the progression of the disease. Treatment options may vary depending on how much the disease has advanced. Possible treatment options may include: Medicines that kill cancer cells (chemotherapy). Radiation therapy. This is the use of high-energy rays to kill cancer cells. A bone marrow transplant. This procedure replaces diseased bone marrow with healthy bone marrow (stem cell transplant). Medicines that block the growth and spread of cancer cells (targeted drug therapy). Medicines that strengthen your immune system's ability to fight cancer cells (immunotherapy or biologic therapy). Participating in clinical trials to find out if new (experimental) treatments are effective. Medicines that help to prevent bone damage (bisphosphonates). Medicines that reduce swelling (corticosteroids). Surgery to repair bone damage. A procedure to remove plasma cells from your blood (plasmapheresis). Other medicines to treat problems such as infections or pain. Follow these instructions at home: Eating and drinking  Drink enough fluid to keep your urine pale yellow. Try to eat healthy meals on a regular basis. Some of your treatments might affect your appetite. If you are having problems eating or if you do not have an appetite, meet with a diet and nutrition specialist (dietitian). Take vitamins or supplements only  as told by your health care provider or dietitian.  Some vitamins and supplements may interfere with how well your treatment works.  General instructions Take over-the-counter and prescription medicines only as told by your health care provider. Stay active. Talk with your health care provider about what types of exercises and activities are safe for you. Avoid activities that cause increased pain. Do not lift anything that is heavier than 10 lb (4.5 kg), or the limit that you are told, until your health care provider says that it is safe. Consider joining a support group or getting counseling to help you cope with the stress of having multiple myeloma. Keep all follow-up visits as told by your health care provider. This is important. Where to find more information American Cancer Society: www.cancer.org Leukemia and Lymphoma Society: www.LLS.[place] www.cancer.gov Contact a health care provider if you: Have pain that gets worse or does not get better with medicine. Have a fever. Have swollen legs. Have weakness or dizziness. Have unexplained weight loss. Have unexplained bleeding or bruising. Have a cough or symptoms of the common cold. Feel depressed. Have changes in urination or bowel movements. Get help right away if you: Have sudden severe pain, especially back pain. Have numbness or weakness in your arms, hands, legs, or feet. Become very confused. Have weakness on one side of your body. Have slurred speech. Have trouble staying awake. Have shortness of breath. Have blood in your stool (feces) or urine. Vomit blood or cough up blood. Summary Multiple myeloma is a form of cancer. It develops when abnormal plasma cells grow out of control. There is no cure for multiple myeloma. However, treatments can manage symptoms and slow the progression of the disease. Treatment options may vary depending on how much the disease has advanced. Do not lift anything that is heavier than 10 lb (4.5 kg), or the  limit that you are told, until your health care provider says that it is safe. Contact your health care provider if you have any new symptoms or sudden severe pain, especially back pain. This information is not intended to replace advice given to you by your health care provider. Make sure you discuss any questions you have with your healthcare provider. Document Revised: 04/17/2020 Document Reviewed: 04/17/2020 Elsevier Patient Education  [place].  Monoclonal Gammopathy of Undetermined Significance Monoclonal gammopathy of undetermined significance (MGUS) is a condition in which there is too much of a protein called monoclonal protein, or M protein, in the blood. MGUS can cause you to have too many cells in your blood and not enough space for healthy cells. This condition does not cause symptoms, but it may increase your risk of developing multiple myeloma or other blood disordersin the future. What are the causes? The cause of this condition is not known. Genetics and the environment may [place] role. What increases the risk? You are more likely to develop this condition if: You are African American. You are age 55 or older. You are female. You have an autoimmune disease. You have been exposed to radiation. You have a family history of MGUS. What are the signs or symptoms? There are no symptoms of this condition. How is this diagnosed?  This condition may be diagnosed with a blood test that checks for M protein. How is this treated? Treatment may involve monitoring your condition. This may include: Having regular exams. This will allow your health care provider to monitor your health. Having tests done regularly, such as:  Blood tests to check for M protein in your body. Imaging tests, such as a CT scan. A bone marrow biopsy. This test involves taking a sample of bone marrow from your body so it can be looked at under a microscope. Follow these instructions at home: Keep all  follow-up visits as told by your health care provider. This is important. Contact a health care provider if: You have trouble swallowing. You have pain in your back or ribs. You have a fever. You are bruising easily. Get help right away if: You break a bone. You have trouble breathing. Summary Monoclonal gammopathy of undetermined significance (MGUS) is a condition in which there is too much of a protein called monoclonal protein, or M protein, in the blood. This condition may be diagnosed with a blood test that checks for M protein. Treatment for this condition may involve having tests done regularly. Tests may include blood tests, imaging tests, and a bone marrow biopsy. This information is not intended to replace advice given to you by your health care provider. Make sure you discuss any questions you have with your healthcare provider. Document Revised: 10/14/2019 Document Reviewed: 10/14/2019 Elsevier Patient Education  2022 [employer].

## 2021-07-12 DIAGNOSIS — N1831 Chronic kidney disease, stage 3a: Secondary | ICD-10-CM | POA: Diagnosis not present

## 2021-07-12 DIAGNOSIS — R200 Anesthesia of skin: Secondary | ICD-10-CM | POA: Diagnosis not present

## 2021-07-12 DIAGNOSIS — D472 Monoclonal gammopathy: Secondary | ICD-10-CM | POA: Diagnosis not present

## 2021-07-13 ENCOUNTER — Encounter: Payer: Self-pay | Admitting: Internal Medicine

## 2021-08-01 DIAGNOSIS — D472 Monoclonal gammopathy: Secondary | ICD-10-CM | POA: Diagnosis not present

## 2021-08-01 DIAGNOSIS — D7581 Myelofibrosis: Secondary | ICD-10-CM | POA: Diagnosis not present

## 2021-08-01 DIAGNOSIS — D759 Disease of blood and blood-forming organs, unspecified: Secondary | ICD-10-CM | POA: Diagnosis not present

## 2021-08-01 DIAGNOSIS — D7589 Other specified diseases of blood and blood-forming organs: Secondary | ICD-10-CM | POA: Diagnosis not present

## 2021-08-01 DIAGNOSIS — E8809 Other disorders of plasma-protein metabolism, not elsewhere classified: Secondary | ICD-10-CM | POA: Diagnosis not present

## 2021-08-10 DIAGNOSIS — D2272 Melanocytic nevi of left lower limb, including hip: Secondary | ICD-10-CM | POA: Diagnosis not present

## 2021-08-10 DIAGNOSIS — L821 Other seborrheic keratosis: Secondary | ICD-10-CM | POA: Diagnosis not present

## 2021-08-10 DIAGNOSIS — L820 Inflamed seborrheic keratosis: Secondary | ICD-10-CM | POA: Diagnosis not present

## 2021-08-10 DIAGNOSIS — D2261 Melanocytic nevi of right upper limb, including shoulder: Secondary | ICD-10-CM | POA: Diagnosis not present

## 2021-08-10 DIAGNOSIS — D2271 Melanocytic nevi of right lower limb, including hip: Secondary | ICD-10-CM | POA: Diagnosis not present

## 2021-08-10 DIAGNOSIS — D225 Melanocytic nevi of trunk: Secondary | ICD-10-CM | POA: Diagnosis not present

## 2021-08-10 DIAGNOSIS — D2262 Melanocytic nevi of left upper limb, including shoulder: Secondary | ICD-10-CM | POA: Diagnosis not present

## 2021-08-10 DIAGNOSIS — L298 Other pruritus: Secondary | ICD-10-CM | POA: Diagnosis not present

## 2021-08-15 DIAGNOSIS — D472 Monoclonal gammopathy: Secondary | ICD-10-CM | POA: Diagnosis not present

## 2021-08-15 DIAGNOSIS — R918 Other nonspecific abnormal finding of lung field: Secondary | ICD-10-CM | POA: Diagnosis not present

## 2021-08-19 ENCOUNTER — Encounter: Payer: Self-pay | Admitting: Emergency Medicine

## 2021-08-19 ENCOUNTER — Other Ambulatory Visit: Payer: Self-pay

## 2021-08-19 ENCOUNTER — Ambulatory Visit
Admission: EM | Admit: 2021-08-19 | Discharge: 2021-08-19 | Disposition: A | Discharge status: Home / Self Care | Payer: Medicare PPO | Attending: Emergency Medicine | Admitting: Emergency Medicine

## 2021-08-19 DIAGNOSIS — M62830 Muscle spasm of back: Secondary | ICD-10-CM

## 2021-08-19 DIAGNOSIS — S22079D Unspecified fracture of T9-T10 vertebra, subsequent encounter for fracture with routine healing: Secondary | ICD-10-CM | POA: Insufficient documentation

## 2021-08-19 MED ORDER — METHOCARBAMOL 500 MG PO TABS: 500.0000 mg | ORAL_TABLET | Freq: Two times a day (BID) | ORAL | 0 refills | Status: DC | PRN

## 2021-08-19 NOTE — Discharge Instructions (Addendum)
Take Tylenol or ibuprofen as needed for discomfort.   Take the muscle relaxer as needed for muscle spasm; Do not drive, operate machinery, or drink alcohol with this medication as it can cause drowsiness.  Change positions slowly and be careful about falls as we discussed.  Follow up with your primary care provider if your symptoms are not improving.

## 2021-08-19 NOTE — ED Triage Notes (Signed)
Pt here with back spasms since yesterday mid lumbar. Worse with movement.

## 2021-08-19 NOTE — ED Provider Notes (Signed)
[facility]    CSN: [id number] Arrival date & time: 08/19/21  1400      History   Chief Complaint Chief Complaint  Patient presents with   Back Pain    HPI Carol Stone is a 75 y.o. female.  Patient presents with muscle spasms in her back x1 day.  No falls or injury.  She she attributes her symptom to [place].  The pain is nonradiating, worse with movement, improves with rest.  Treatment at home with Tylenol and ibuprofen.  She denies numbness, weakness, paresthesias, saddle anesthesia, loss of bowel/bladder control, fever, chills, abdominal pain, dysuria, hematuria, pelvic pain, rash, lesions, redness, bruising, or other symptoms.  Her medical history includes multiple myeloma, prediabetes, CKD, arthritis of neck, arthritis of the lumbar spine, frequent headaches, IBS.  The history is provided by the patient and medical records.   Past Medical History:  Diagnosis Date   Allergy    Arthritis    Cataract    Frequent headaches    seasonal   GERD (gastroesophageal reflux disease)    History of chicken pox    History of colon polyps    Hypercholesteremia    IBS (irritable bowel syndrome)    constipation    OSA on CPAP    Osteoporosis    Sleep apnea    Tinnitus    Venous insufficiency     Patient Active Problem List   Diagnosis Date Noted   Prediabetes 07/09/2021   Hypercalcemia 07/09/2021   Decreased GFR 07/09/2021   Abnormal SPEP 07/09/2021   Arthritis of neck 04/02/2021   Arthritis of lumbar spine 04/02/2021   Subconjunctival hemorrhage of left eye 01/17/2021   Lymphedema 01/17/2021   Early satiety 01/17/2021   Hiatal hernia 01/17/2021   Hypoglycemia 01/17/2021   Numbness of toes 01/17/2021   CKD (chronic kidney disease), stage III ([place]) 08/02/2020   Sinus tachycardia 07/13/2020   Impingement syndrome of left shoulder 06/25/2020   Immunization reaction 01/13/2020   Leg edema 01/13/2020   Capillaritis 03/23/2019    Elevated blood pressure reading 11/23/2018   Hyperlipidemia 11/23/2018   Osteoporosis 11/23/2018   OSA on CPAP 11/23/2018   Insomnia 11/23/2018    Past Surgical History:  Procedure Laterality Date   ABDOMINAL HYSTERECTOMY     COLONOSCOPY WITH PROPOFOL N/A 03/14/2016   Procedure: COLONOSCOPY WITH PROPOFOL;  Surgeon: Hulen Luster, MD;  Location: [facility] ENDOSCOPY;  Service: Gastroenterology;  Laterality: N/A;   DILATION AND CURETTAGE OF UTERUS     TONSILLECTOMY      OB History     Gravida  0   Para  0   Term  0   Preterm  0   AB  0   Living  0      SAB  0   IAB  0   Ectopic  0   Multiple  0   Live Births  0            Home Medications    Prior to Admission medications   Medication Sig Start Date End Date Taking? Authorizing Provider  methocarbamol (ROBAXIN) 500 MG tablet Take 1 tablet (500 mg total) by mouth 2 (two) times daily as needed for muscle spasms. 08/19/21  Yes Sharion Balloon, NP  atorvastatin (LIPITOR) 10 MG tablet Take 1 tablet (10 mg total) by mouth daily at 6 PM. 07/11/21   McLean-Scocuzza, Nino Glow, MD  calcium-vitamin D (OSCAL WITH D) 500-200 MG-UNIT tablet  Take 1 tablet by mouth.    [provider]  diclofenac sodium (VOLTAREN) 1 % GEL Apply topically 4 (four) times daily. Prn    [provider]  furosemide (LASIX) 40 MG tablet TAKE 1 TABLET (40 MG TOTAL) BY MOUTH EVERY MORNING AS NEEDED. 04/12/21   McLean-Scocuzza, Nino Glow, MD  metroNIDAZOLE (METROCREAM) 0.75 % cream APPLY TOPICALLY TO FACE TWICE A DAY 06/02/19   [provider]  Multiple Vitamin (MULTIVITAMIN) tablet Take 1 tablet by mouth daily.    [provider]  polyethylene glycol powder (GLYCOLAX/MIRALAX) powder Take by mouth. 09/23/17   [provider]  RESTASIS 0.05 % ophthalmic emulsion PLACE 1 DROP INTO BOTH EYES TWICE A DAY 03/15/17   [provider]  scopolamine (TRANSDERM-SCOP) 1 MG/3DAYS Place 1 patch (1.5 mg total) onto the skin every 3  (three) days. Apply behind 1 ear 4 hours prior to cruise 06/11/21   McLean-Scocuzza, Nino Glow, MD  triamcinolone cream (KENALOG) 0.1 % Apply 1 application topically 2 (two) times daily. Prn legs b/l 03/23/19   McLean-Scocuzza, Nino Glow, MD    Family History Family History  Problem Relation Age of Onset   Prostate cancer Father    Cancer Father        prostate    Hypertension Father    Lung cancer Sister    Cancer Sister        lung smoker    Deep vein thrombosis Sister    Pulmonary embolism Sister    Breast cancer Maternal Aunt 31   Cancer Maternal Aunt        breast   Stroke Mother    Osteoporosis Mother    Heart disease Maternal Grandmother        CHF   AAA (abdominal aortic aneurysm) Paternal Grandmother    Hypertension Paternal Grandmother    Cancer Paternal Grandfather        prostate    Social History Social History   Tobacco Use   Smoking status: Former   Smokeless tobacco: Never   Tobacco comments:    age [age] light   Vaping Use   Vaping Use: Never used  Substance Use Topics   Alcohol use: No   Drug use: No     Allergies   Bactrim [sulfamethoxazole-trimethoprim], Codeine, Compazine [prochlorperazine edisylate], Demerol [meperidine], Indocin [indomethacin], Other, and Reglan [metoclopramide]   Review of Systems Review of Systems  Constitutional:  Negative for chills and fever.  Respiratory:  Negative for cough and shortness of breath.   Cardiovascular:  Negative for chest pain and palpitations.  Gastrointestinal:  Negative for abdominal pain and vomiting.  Genitourinary:  Negative for dysuria and hematuria.  Musculoskeletal:  Positive for myalgias. Negative for arthralgias and joint swelling.  Skin:  Negative for color change, rash and wound.  Neurological:  Negative for weakness and numbness.  All other systems reviewed and are negative.   Physical Exam Triage Vital Signs ED Triage Vitals  Enc Vitals Group     BP      Pulse      Resp      Temp       Temp src      SpO2      Weight      Height      Head Circumference      Peak Flow      Pain Score      Pain Loc      Pain Edu?      Excl.  in [place]?    No data found.  Updated Vital Signs BP (!) 156/85   Pulse 92   Temp 98.3 F (36.8 C)   Resp 16   SpO2 98%   Visual Acuity Right Eye Distance:   Left Eye Distance:   Bilateral Distance:    Right Eye Near:   Left Eye Near:    Bilateral Near:     Physical Exam Vitals and nursing note reviewed.  Constitutional:      General: She is not in acute distress.    Appearance: She is well-developed.  HENT:     Head: Normocephalic and atraumatic.     Mouth/Throat:     Mouth: Mucous membranes are moist.  Eyes:     Conjunctiva/sclera: Conjunctivae normal.  Cardiovascular:     Rate and Rhythm: Normal rate and regular rhythm.     Heart sounds: Normal heart sounds.  Pulmonary:     Effort: Pulmonary effort is normal. No respiratory distress.     Breath sounds: Normal breath sounds.  Abdominal:     Palpations: Abdomen is soft.     Tenderness: There is no abdominal tenderness. There is no right CVA tenderness, left CVA tenderness, guarding or rebound.  Musculoskeletal:        General: No swelling, tenderness, deformity or signs of injury. Normal range of motion.     Cervical back: Neck supple.  Skin:    General: Skin is warm and dry.     Findings: No bruising, erythema, lesion or rash.  Neurological:     General: No focal deficit present.     Mental Status: She is alert and oriented to person, place, and time.     Sensory: No sensory deficit.     Motor: No weakness.     Gait: Gait normal.  Psychiatric:        Mood and Affect: Mood normal.        Behavior: Behavior normal.     UC Treatments / Results  Labs (all labs ordered are listed, but only abnormal results are displayed) Labs Reviewed - No data to display  EKG   Radiology No results found.  Procedures Procedures (including critical care  time)  Medications Ordered in UC Medications - No data to display  Initial Impression / Assessment and Plan / UC Course  I have reviewed the triage vital signs and the nursing notes.  Pertinent labs & imaging results that were available during my care of the patient were reviewed by me and considered in my medical decision making (see chart for details).  Muscle spasm of back.  Instructed patient to continue Tylenol or ibuprofen as needed for discomfort.  Discussed symptomatic treatment including gentle massage, warm compresses, stretching.  Also treating with methocarbamol as needed; discussed at length precautions for drowsiness and falls.  Instructed patient to follow-up with her PCP if her symptoms are not improving.  She agrees to plan of care.   Final Clinical Impressions(s) / UC Diagnoses   Final diagnoses:  Muscle spasm of back     Discharge Instructions      Take Tylenol or ibuprofen as needed for discomfort.   Take the muscle relaxer as needed for muscle spasm; Do not drive, operate machinery, or drink alcohol with this medication as it can cause drowsiness.  Change positions slowly and be careful about falls as we discussed.  Follow up with your primary care provider if your symptoms are not improving.  ED Prescriptions     Medication Sig Dispense Auth. Provider   methocarbamol (ROBAXIN) 500 MG tablet Take 1 tablet (500 mg total) by mouth 2 (two) times daily as needed for muscle spasms. 10 tablet Sharion Balloon, NP      I have reviewed the PDMP during this encounter.   Sharion Balloon, NP 08/19/21 1456

## 2021-08-22 ENCOUNTER — Encounter: Payer: Self-pay | Admitting: Family Medicine

## 2021-08-22 ENCOUNTER — Other Ambulatory Visit: Payer: Self-pay

## 2021-08-22 ENCOUNTER — Ambulatory Visit (INDEPENDENT_AMBULATORY_CARE_PROVIDER_SITE_OTHER): Payer: Medicare PPO | Admitting: Family Medicine

## 2021-08-22 VITALS — BP 110/80 | HR 99 | Temp 99.2°F | Ht 60.0 in | Wt 129.4 lb

## 2021-08-22 DIAGNOSIS — Z23 Encounter for immunization: Secondary | ICD-10-CM

## 2021-08-22 DIAGNOSIS — D472 Monoclonal gammopathy: Secondary | ICD-10-CM | POA: Diagnosis not present

## 2021-08-22 DIAGNOSIS — G629 Polyneuropathy, unspecified: Secondary | ICD-10-CM | POA: Insufficient documentation

## 2021-08-22 DIAGNOSIS — M62830 Muscle spasm of back: Secondary | ICD-10-CM

## 2021-08-22 HISTORY — DX: Polyneuropathy, unspecified: G62.9

## 2021-08-22 HISTORY — DX: Muscle spasm of back: M62.830

## 2021-08-22 MED ORDER — BACLOFEN 10 MG PO TABS: 5.0000 mg | ORAL_TABLET | Freq: Three times a day (TID) | ORAL | 0 refills | Status: DC

## 2021-08-22 NOTE — Assessment & Plan Note (Signed)
She has been evaluated by neurology.  She will continue to see him.

## 2021-08-22 NOTE — Progress Notes (Signed)
Tommi Rumps, MD Phone: [phone]  Carol Stone is a 75 y.o. female who presents today for follow-up.  Back spasms: Patient notes this started on 08/18/2021.  She notes that eased off that day though recurred the next day and has been going on since then.  She went to urgent care and was prescribed Robaxin though that caused nausea and vomiting.  She notes no numbness, weakness, incontinence, saddle anesthesia, or radiation of the pain.  She has been taking Tylenol and ibuprofen as well as using a heating pad and Voltaren gel.  She notes no specific injury though does note she had been driving a lot the 2 days prior to the onset of the symptoms.  Chronic neuropathy: Patient follows with neurology for this.  She is having a nerve conduction study in the near future.  MGUS: Recently diagnosed with MGUS.  Her hematologist plans on monitoring this for now.  Social History   Tobacco Use  Smoking Status Former  Smokeless Tobacco Never  Tobacco Comments   age [age] light     Current Outpatient Medications on File Prior to Visit  Medication Sig Dispense Refill   atorvastatin (LIPITOR) 10 MG tablet Take 1 tablet (10 mg total) by mouth daily at 6 PM. 90 tablet 3   calcium-vitamin D (OSCAL WITH D) 500-200 MG-UNIT tablet Take 1 tablet by mouth.     diclofenac sodium (VOLTAREN) 1 % GEL Apply topically 4 (four) times daily. Prn     furosemide (LASIX) 40 MG tablet TAKE 1 TABLET (40 MG TOTAL) BY MOUTH EVERY MORNING AS NEEDED. 90 tablet 1   methocarbamol (ROBAXIN) 500 MG tablet Take 1 tablet (500 mg total) by mouth 2 (two) times daily as needed for muscle spasms. 10 tablet 0   Multiple Vitamin (MULTIVITAMIN) tablet Take 1 tablet by mouth daily.     RESTASIS 0.05 % ophthalmic emulsion PLACE 1 DROP INTO BOTH EYES TWICE A DAY  2   metroNIDAZOLE (METROCREAM) 0.75 % cream APPLY TOPICALLY TO FACE TWICE A DAY (Patient not taking: Reported on 08/22/2021)     polyethylene glycol powder  (GLYCOLAX/MIRALAX) powder Take by mouth. (Patient not taking: Reported on 08/22/2021)     scopolamine (TRANSDERM-SCOP) 1 MG/3DAYS Place 1 patch (1.5 mg total) onto the skin every 3 (three) days. Apply behind 1 ear 4 hours prior to cruise 8 patch 0   triamcinolone cream (KENALOG) 0.1 % Apply 1 application topically 2 (two) times daily. Prn legs b/l (Patient not taking: Reported on 08/22/2021) 454 g 0   No current facility-administered medications on file prior to visit.     ROS see history of present illness  Objective  Physical Exam Vitals:   08/22/21 1315  BP: 110/80  Pulse: 99  Temp: 99.2 F (37.3 C)  SpO2: 99%    BP Readings from Last 3 Encounters:  08/22/21 110/80  08/19/21 (!) 156/85  07/11/21 134/82   Wt Readings from Last 3 Encounters:  08/22/21 129 lb 6.4 oz (58.7 kg)  07/11/21 133 lb 12.8 oz (60.7 kg)  01/17/21 130 lb 6.4 oz (59.1 kg)    Physical Exam Constitutional:      General: She is not in acute distress. Musculoskeletal:     Comments: No midline spine tenderness, no midline spine step-off, there is upper muscular back tenderness and spasm noted  Neurological:     Mental Status: She is alert.     Comments: 5/5 strength in bilateral biceps, triceps, grip, quads, hamstrings, plantar and dorsiflexion, sensation  to light touch intact in bilateral UE and LE, normal gait, 2+ patellar reflexes     Assessment/Plan: Please see individual problem list.  Problem List Items Addressed This Visit     Back spasm    Patient with muscular spasms in her back.  We will treat with baclofen 5 mg 3 times daily as needed.  Discussed the risk of drowsiness.  Advised to let us know if she develops nausea and vomiting with this.  If she gets excessively drowsy she will contact us as well.  If she has persistent symptoms or any worsening symptoms she will let us know.  Given return precautions.      Relevant Medications   baclofen (LIORESAL) 10 MG tablet   MGUS (monoclonal  gammopathy of unknown significance)    Recent diagnosis.  She will continue monitoring through hematology.      Neuropathy    She has been evaluated by neurology.  She will continue to see him.      Other Visit Diagnoses     Need for immunization against influenza    -  Primary   Relevant Orders   Flu Vaccine QUAD High Dose(Fluad) (Completed)       Return if symptoms worsen or fail to improve.  This visit occurred during the SARS-CoV-2 public health emergency.  Safety protocols were in place, including screening questions prior to the visit, additional usage of staff PPE, and extensive cleaning of exam room while observing appropriate contact time as indicated for disinfecting solutions.    Tommi Rumps, MD [place]

## 2021-08-22 NOTE — Patient Instructions (Signed)
Nice to see you. Please try the baclofen for your spasms.  If this is not beneficial please let me know.  If you have side effects with this please let me know. If this medication makes you drowsy please do not drive. If you develop numbness, weakness, loss of bowel or bladder function, or worsening symptoms please contact us immediately or be evaluated.

## 2021-08-22 NOTE — Assessment & Plan Note (Signed)
Patient with muscular spasms in her back.  We will treat with baclofen 5 mg 3 times daily as needed.  Discussed the risk of drowsiness.  Advised to let us know if she develops nausea and vomiting with this.  If she gets excessively drowsy she will contact us as well.  If she has persistent symptoms or any worsening symptoms she will let us know.  Given return precautions.

## 2021-08-22 NOTE — Assessment & Plan Note (Signed)
Recent diagnosis.  She will continue monitoring through hematology.

## 2021-08-26 ENCOUNTER — Encounter: Payer: Self-pay | Admitting: Family Medicine

## 2021-08-27 ENCOUNTER — Encounter: Payer: Self-pay | Admitting: Family Medicine

## 2021-08-27 NOTE — Telephone Encounter (Signed)
Patient is returning your call from earlier,please advise.

## 2021-08-27 NOTE — Telephone Encounter (Signed)
Called and LVM for patient to call back.  Kerigan Narvaez,cma

## 2021-08-28 DIAGNOSIS — M545 Low back pain, unspecified: Secondary | ICD-10-CM | POA: Insufficient documentation

## 2021-08-28 DIAGNOSIS — S39012A Strain of muscle, fascia and tendon of lower back, initial encounter: Secondary | ICD-10-CM | POA: Diagnosis not present

## 2021-08-28 DIAGNOSIS — M47896 Other spondylosis, lumbar region: Secondary | ICD-10-CM | POA: Diagnosis not present

## 2021-08-28 NOTE — Telephone Encounter (Signed)
Patient calling back in and informed of the below. Patient will go and be seen by [facility] today.

## 2021-08-31 ENCOUNTER — Encounter: Payer: Self-pay | Admitting: Family Medicine

## 2021-09-18 ENCOUNTER — Encounter: Payer: Self-pay | Admitting: Internal Medicine

## 2021-09-27 ENCOUNTER — Other Ambulatory Visit: Payer: Self-pay

## 2021-09-27 ENCOUNTER — Ambulatory Visit
Admission: RE | Admit: 2021-09-27 | Discharge: 2021-09-27 | Disposition: A | Discharge status: Home / Self Care | Payer: Medicare PPO | Source: Ambulatory Visit | Attending: Internal Medicine | Admitting: Internal Medicine

## 2021-09-27 DIAGNOSIS — Z1231 Encounter for screening mammogram for malignant neoplasm of breast: Secondary | ICD-10-CM | POA: Diagnosis not present

## 2021-10-18 ENCOUNTER — Other Ambulatory Visit: Payer: Self-pay

## 2021-10-18 ENCOUNTER — Encounter: Payer: Self-pay | Admitting: Internal Medicine

## 2021-10-18 ENCOUNTER — Ambulatory Visit (INDEPENDENT_AMBULATORY_CARE_PROVIDER_SITE_OTHER): Payer: Medicare PPO | Admitting: Internal Medicine

## 2021-10-18 VITALS — BP 148/72 | HR 104 | Temp 96.6°F | Ht 60.0 in | Wt 131.6 lb

## 2021-10-18 DIAGNOSIS — R1084 Generalized abdominal pain: Secondary | ICD-10-CM | POA: Diagnosis not present

## 2021-10-18 DIAGNOSIS — R911 Solitary pulmonary nodule: Secondary | ICD-10-CM

## 2021-10-18 DIAGNOSIS — M47816 Spondylosis without myelopathy or radiculopathy, lumbar region: Secondary | ICD-10-CM

## 2021-10-18 DIAGNOSIS — M47814 Spondylosis without myelopathy or radiculopathy, thoracic region: Secondary | ICD-10-CM | POA: Diagnosis not present

## 2021-10-18 DIAGNOSIS — D472 Monoclonal gammopathy: Secondary | ICD-10-CM

## 2021-10-18 DIAGNOSIS — K449 Diaphragmatic hernia without obstruction or gangrene: Secondary | ICD-10-CM | POA: Diagnosis not present

## 2021-10-18 DIAGNOSIS — R030 Elevated blood-pressure reading, without diagnosis of hypertension: Secondary | ICD-10-CM

## 2021-10-18 DIAGNOSIS — K5900 Constipation, unspecified: Secondary | ICD-10-CM

## 2021-10-18 DIAGNOSIS — R918 Other nonspecific abnormal finding of lung field: Secondary | ICD-10-CM

## 2021-10-18 DIAGNOSIS — R634 Abnormal weight loss: Secondary | ICD-10-CM

## 2021-10-18 DIAGNOSIS — R140 Abdominal distension (gaseous): Secondary | ICD-10-CM | POA: Diagnosis not present

## 2021-10-18 DIAGNOSIS — M47812 Spondylosis without myelopathy or radiculopathy, cervical region: Secondary | ICD-10-CM | POA: Diagnosis not present

## 2021-10-18 HISTORY — DX: Spondylosis without myelopathy or radiculopathy, thoracic region: M47.814

## 2021-10-18 NOTE — Progress Notes (Addendum)
Chief Complaint  Patient presents with   Follow-up    Pt wanted to discuss Back pain and skeletal CT scan.   Bloated    Pt states that she has had constipation and bloating for a while. Pt recently felt that her stomach was "bigger" x2weeks Pt has not had an abdominal CT scan.    F/u  1. MGUS dx [facility] with labs and  bone marrow bx skeletal ct scan with spondylosis diffuse spine and 9 mm LLL 2. C/o left lower back mid back pain muscle spasm x 3 weeks so painful cant lie flat on back and worse on hard surface robaxin caused side effects, baclofen did not work and flexeril 5 mg qd prn helped got from [facility]  6/10  3. C/o increased generalized abdominal girth x 2 weeks and generalized pain and constipation and bloating just dx'ed with MGUS   Review of Systems  Constitutional:  Negative for weight loss.  HENT:  Negative for hearing loss.   Eyes:  Negative for blurred vision.  Respiratory:  Negative for shortness of breath.   Cardiovascular:  Negative for chest pain.  Gastrointestinal:  Negative for abdominal pain and blood in stool.  Genitourinary:  Negative for dysuria.  Musculoskeletal:  Negative for falls and joint pain.  Skin:  Negative for rash.  Neurological:  Negative for headaches.  Psychiatric/Behavioral:  Negative for depression.   Past Medical History:  Diagnosis Date   Allergy    Arthritis    Cataract    Frequent headaches    seasonal   GERD (gastroesophageal reflux disease)    History of chicken pox    History of colon polyps    Hypercholesteremia    IBS (irritable bowel syndrome)    constipation    OSA on CPAP    Osteoporosis    Sleep apnea    Tinnitus    Venous insufficiency    Past Surgical History:  Procedure Laterality Date   ABDOMINAL HYSTERECTOMY     COLONOSCOPY WITH PROPOFOL N/A 03/14/2016   Procedure: COLONOSCOPY WITH PROPOFOL;  Surgeon: Hulen Luster, MD;  Location: [facility] ENDOSCOPY;  Service: Gastroenterology;  Laterality: N/A;   DILATION AND  CURETTAGE OF UTERUS     TONSILLECTOMY     Family History  Problem Relation Age of Onset   Prostate cancer Father    Cancer Father        prostate    Hypertension Father    Lung cancer Sister    Cancer Sister        lung smoker    Deep vein thrombosis Sister    Pulmonary embolism Sister    Breast cancer Maternal Aunt 68   Cancer Maternal Aunt        breast   Stroke Mother    Osteoporosis Mother    Heart disease Maternal Grandmother        CHF   AAA (abdominal aortic aneurysm) Paternal Grandmother    Hypertension Paternal Grandmother    Cancer Paternal Grandfather        prostate   Social History   Socioeconomic History   Marital status: Married    Spouse name: Not on file   Number of children: Not on file   Years of education: Not on file   Highest education level: Not on file  Occupational History   Not on file  Tobacco Use   Smoking status: Former   Smokeless tobacco: Never   Tobacco comments:    age [age] light  Vaping Use   Vaping Use: Never used  Substance and Sexual Activity   Alcohol use: No   Drug use: No   Sexual activity: Not Currently    Birth control/protection: Surgical  Other Topics Concern   Not on file  Social History Narrative   Married, husband in house    Wears seat belt, husband owns guns, safe in relationship    No kids    Masters, previous nurse office work    No kids    Retired    Scientist, physiological Strain: Low Risk    Difficulty of Paying Living Expenses: Not hard at all  Food Insecurity: No Food Insecurity   Worried About Charity fundraiser in the Last Year: Never true   Arboriculturist in the Last Year: Never true  Transportation Needs: No Transportation Needs   Lack of Transportation (Medical): No   Lack of Transportation (Non-Medical): No  Physical Activity: Sufficiently Active   Days of Exercise per Week: 5 days   Minutes of Exercise per Session: 60 min  Stress: No Stress Concern  Present   Feeling of Stress : Not at all  Social Connections: Unknown   Frequency of Communication with Friends and Family: More than three times a week   Frequency of Social Gatherings with Friends and Family: More than three times a week   Attends Religious Services: Not on Electrical engineer or Organizations: Not on file   Attends Archivist Meetings: Not on file   Marital Status: Not on file  Intimate Partner Violence: Not At Risk   Fear of Current or Ex-Partner: No   Emotionally Abused: No   Physically Abused: No   Sexually Abused: No   Current Meds  Medication Sig   atorvastatin (LIPITOR) 10 MG tablet Take 1 tablet (10 mg total) by mouth daily at 6 PM.   calcium-vitamin D (OSCAL WITH D) 500-200 MG-UNIT tablet Take 1 tablet by mouth.   cyclobenzaprine (FLEXERIL) 5 MG tablet Take 5 mg by mouth daily as needed for muscle spasms.   diclofenac sodium (VOLTAREN) 1 % GEL Apply topically 4 (four) times daily. Prn   famotidine (PEPCID) 20 MG tablet Take 20 mg by mouth 2 (two) times daily.   furosemide (LASIX) 40 MG tablet TAKE 1 TABLET (40 MG TOTAL) BY MOUTH EVERY MORNING AS NEEDED.   metroNIDAZOLE (METROCREAM) 0.75 % cream    Multiple Vitamin (MULTIVITAMIN) tablet Take 1 tablet by mouth daily.   polyethylene glycol powder (GLYCOLAX/MIRALAX) powder Take by mouth.   RESTASIS 0.05 % ophthalmic emulsion PLACE 1 DROP INTO BOTH EYES TWICE A DAY   triamcinolone cream (KENALOG) 0.1 % Apply 1 application topically 2 (two) times daily. Prn legs b/l   Allergies  Allergen Reactions   Baclofen     Not effective    Bactrim [Sulfamethoxazole-Trimethoprim] Hives   Codeine Other (See Comments)    Nervous system reaction insides coming out through skin   Compazine [Prochlorperazine Edisylate] Other (See Comments)   Demerol [Meperidine] Other (See Comments)    Nervous system reaction insides coming out through skin   Indocin [Indomethacin] Other (See Comments)   Other      Onions and garlic     Reglan [Metoclopramide] Other (See Comments)   Robaxin [Methocarbamol]     N/V   No results found for this or any previous visit (from the past 2160 hour(s)). Objective  Body mass index  is 25.7 kg/m. Wt Readings from Last 3 Encounters:  10/18/21 131 lb 9.6 oz (59.7 kg)  08/22/21 129 lb 6.4 oz (58.7 kg)  07/11/21 133 lb 12.8 oz (60.7 kg)   Temp Readings from Last 3 Encounters:  10/18/21 (!) 96.6 F (35.9 C)  08/22/21 99.2 F (37.3 C) (Oral)  08/19/21 98.3 F (36.8 C)   BP Readings from Last 3 Encounters:  10/18/21 (!) 148/72  08/22/21 110/80  08/19/21 (!) 156/85   Pulse Readings from Last 3 Encounters:  10/18/21 (!) 104  08/22/21 99  08/19/21 92    Physical Exam Vitals and nursing note reviewed.  Constitutional:      Appearance: Normal appearance. She is well-developed and well-groomed.  HENT:     Head: Normocephalic and atraumatic.  Eyes:     Conjunctiva/sclera: Conjunctivae normal.     Pupils: Pupils are equal, round, and reactive to light.  Cardiovascular:     Rate and Rhythm: Normal rate and regular rhythm.     Heart sounds: Normal heart sounds. No murmur heard. Pulmonary:     Effort: Pulmonary effort is normal.     Breath sounds: Normal breath sounds.  Abdominal:     General: Abdomen is flat. Bowel sounds are normal.     Tenderness: There is no abdominal tenderness.  Musculoskeletal:        General: No tenderness.       Back:  Skin:    General: Skin is warm and dry.  Neurological:     General: No focal deficit present.     Mental Status: She is alert and oriented to person, place, and time. Mental status is at baseline.     Cranial Nerves: Cranial nerves 2-12 are intact.     Gait: Gait is intact.  Psychiatric:        Attention and Perception: Attention and perception normal.        Mood and Affect: Mood and affect normal.        Speech: Speech normal.        Behavior: Behavior normal. Behavior is cooperative.        Thought  Content: Thought content normal.        Cognition and Memory: Cognition and memory normal.        Judgment: Judgment normal.    Assessment  Plan  Lung nodule - Plan: CT Chest Wo Contrast Abnormal findings on diagnostic imaging of lung - Plan: CT Chest Wo Contrast  Constipation,ab bloating/generalized ab pain/increased girth w/ weight loss [id number]/early 2021 to 129-131 as of now - Plan: CT ABDOMEN PELVIS WO CONTRAST 2 caps Miralax instead of 1   Hiatal hernia - Plan: CT ABDOMEN PELVIS WO CONTRAST Generalized abdominal pain - Plan: CT ABDOMEN PELVIS WO CONTRAST  MGUS (monoclonal gammopathy of unknown significance) F/u 11/2021   Cervical arthritis Given Rx PT/OT at [facility] in future check to see if Mcarthur Rossetti will approve  Lumbar spondylosis Thoracic spondylosis without myelopathy See above Prn flexeril 5 mg qhs   Elevated BP  Rec monitor   Future  12/8 [facility] labs and Dr. Cheri Rous  11/20/21 [facility]  11/21/21 echo  11/26/21 Dr. Aaron Edelman, echo   HM Flu shot utd  prevnar utd, utd pna 23 vaccine  Tdap had 08/16/16 Had zostervax  covid 4/4 shingrix vaccine per pt had 2/2    Out of age window for pap s/p hysterectomy 1984 for fibroids 1 ovary intact  Mammogram neg 09/06/19 neg 09/27/21 negative   DEXA 09/03/16 +  osteoporosis was on oral bisphonates and had jaw necrosis declines other meds for now I.e prolia discussed prev. rec calcium 600 mg 1-2 x per day and D3 1000-2000 IU daily  Declines meds disc strontium supplement otc    Colonoscopy h/o polyps last [place] Dr. Candace Cruise diverticulosis 03/14/16 bxs negative will see if needed in 2022 h/o polyps pt does not know type  -appt 02/2021 Dr. Alice Reichert with h/o polyps and having early satiety rec EGD and colonoscopy     Dermatology Dr. Kellie Moor Aks right cheek LN2 saw 2019 f/u 08/2021    Former smoker age [age] light smoker    Eye Dr. Merla Riches saw 11/27/18 sch 11/2019  Dentist Dr. Kendell Bane  GI Dr. Alice Reichert   vascular Dr. Trula Slade Provider:  Dr. Olivia Mackie McLean-Scocuzza-Internal Medicine

## 2021-10-18 NOTE — Patient Instructions (Addendum)
Miralax daily add stool softner  Align probiotics  Warm ginger tea Warm prune juice  +/- benefiber/metamucil  Kiwis   Lidocaine pain patch  Consider PT   [facility] outpatient imaging center  [place]  [phone]   Back Exercises The following exercises strengthen the muscles that help to support the trunk (torso) and back. They also help to keep the lower back flexible. Doing these exercises can help to prevent or lessen existing low back pain. If you have back pain or discomfort, try doing these exercises 2-3 times each day or as told by your health care provider. As your pain improves, do them once each day, but increase the number of times that you repeat the steps for each exercise (do more repetitions). To prevent the recurrence of back pain, continue to do these exercises once each day or as told by your health care provider. Do exercises exactly as told by your health care provider and adjust them as directed. It is normal to feel mild stretching, pulling, tightness, or discomfort as you do these exercises, but you should stop right away if you feel sudden pain or your pain gets worse. Exercises Single knee to chest Repeat these steps 3-5 times for each leg: Lie on your back on a firm bed or the floor with your legs extended. Bring one knee to your chest. Your other leg should stay extended and in contact with the floor. Hold your knee in place by grabbing your knee or thigh with both hands and hold. Pull on your knee until you feel a gentle stretch in your lower back or buttocks. Hold the stretch for 10-30 seconds. Slowly release and straighten your leg.  Pelvic tilt Repeat these steps 5-10 times: Lie on your back on a firm bed or the floor with your legs extended. Bend your knees so they are pointing toward the ceiling and your feet are flat on the floor. Tighten your lower abdominal muscles to press your lower back against the floor. This  motion will tilt your pelvis so your tailbone points up toward the ceiling instead of pointing to your feet or the floor. With gentle tension and even breathing, hold this position for 5-10 seconds.  Cat-cow Repeat these steps until your lower back becomes more flexible: Get into a hands-and-knees position on a firm bed or the floor. Keep your hands under your shoulders, and keep your knees under your hips. You may place padding under your knees for comfort. Let your head hang down toward your chest. Contract your abdominal muscles and point your tailbone toward the floor so your lower back becomes rounded like the back of a cat. Hold this position for 5 seconds. Slowly lift your head, let your abdominal muscles relax, and point your tailbone up toward the ceiling so your back forms a sagging arch like the back of a cow. Hold this position for 5 seconds.  Press-ups Repeat these steps 5-10 times: Lie on your abdomen (face-down) on a firm bed or the floor. Place your palms near your head, about shoulder-width apart. Keeping your back as relaxed as possible and keeping your hips on the floor, slowly straighten your arms to raise the top half of your body and lift your shoulders. Do not use your back muscles to raise your upper torso. You may adjust the placement of your hands to make yourself more comfortable. Hold this position for 5 seconds while you keep your back relaxed. Slowly return  to lying flat on the floor.  Bridges Repeat these steps 10 times: Lie on your back on a firm bed or the floor. Bend your knees so they are pointing toward the ceiling and your feet are flat on the floor. Your arms should be flat at your sides, next to your body. Tighten your buttocks muscles and lift your buttocks off the floor until your waist is at almost the same height as your knees. You should feel the muscles working in your buttocks and the back of your thighs. If you do not feel these muscles, slide  your feet 1-2 inches (2.5-5 cm) farther away from your buttocks. Hold this position for 3-5 seconds. Slowly lower your hips to the starting position, and allow your buttocks muscles to relax completely. If this exercise is too easy, try doing it with your arms crossed over your chest. Abdominal crunches Repeat these steps 5-10 times: Lie on your back on a firm bed or the floor with your legs extended. Bend your knees so they are pointing toward the ceiling and your feet are flat on the floor. Cross your arms over your chest. Tip your chin slightly toward your chest without bending your neck. Tighten your abdominal muscles and slowly raise your torso high enough to lift your shoulder blades a tiny bit off the floor. Avoid raising your torso higher than that because it can put too much stress on your lower back and does not help to strengthen your abdominal muscles. Slowly return to your starting position.  Back lifts Repeat these steps 5-10 times: Lie on your abdomen (face-down) with your arms at your sides, and rest your forehead on the floor. Tighten the muscles in your legs and your buttocks. Slowly lift your chest off the floor while you keep your hips pressed to the floor. Keep the back of your head in line with the curve in your back. Your eyes should be looking at the floor. Hold this position for 3-5 seconds. Slowly return to your starting position.  Contact a health care provider if: Your back pain or discomfort gets much worse when you do an exercise. Your worsening back pain or discomfort does not lessen within 2 hours after you exercise. If you have any of these problems, stop doing these exercises right away. Do not do them again unless your health care provider says that you can. Get help right away if: You develop sudden, severe back pain. If this happens, stop doing the exercises right away. Do not do them again unless your health care provider says that you can. This  information is not intended to replace advice given to you by your health care provider. Make sure you discuss any questions you have with your health care provider. Document Revised: 05/22/2021 Document Reviewed: 02/07/2021 Elsevier Patient Education  2022 [place].  Neck Exercises Ask your health care provider which exercises are safe for you. Do exercises exactly as told by your health care provider and adjust them as directed. It is normal to feel mild stretching, pulling, tightness, or discomfort as you do these exercises. Stop right away if you feel sudden pain or your pain gets worse. Do not begin these exercises until told by your health care provider. Neck exercises can be important for many reasons. They can improve strength and maintain flexibility in your neck, which will help your upper back and prevent neck pain. Stretching exercises Rotation neck stretching  Sit in a chair or stand up. Place your feet  flat on the floor, shoulder-width apart. Slowly turn your head (rotate) to the right until a slight stretch is felt. Turn it all the way to the right so you can look over your right shoulder. Do not tilt or tip your head. Hold this position for 10-30 seconds. Slowly turn your head (rotate) to the left until a slight stretch is felt. Turn it all the way to the left so you can look over your left shoulder. Do not tilt or tip your head. Hold this position for 10-30 seconds. Repeat __________ times. Complete this exercise __________ times a day. Neck retraction  Sit in a sturdy chair or stand up. Look straight ahead. Do not bend your neck. Use your fingers to push your chin backward (retraction). Do not bend your neck for this movement. Continue to face straight ahead. If you are doing the exercise properly, you will feel a slight sensation in your throat and a stretch at the back of your neck. Hold the stretch for 1-2 seconds. Repeat __________ times. Complete this exercise  __________ times a day. Strengthening exercises Neck press  Lie on your back on a firm bed or on the floor with a pillow under your head. Use your neck muscles to push your head down on the pillow and straighten your spine. Hold the position as well as you can. Keep your head facing up (in a neutral position) and your chin tucked. Slowly count to 5 while holding this position. Repeat __________ times. Complete this exercise __________ times a day. Isometrics These are exercises in which you strengthen the muscles in your neck while keeping your neck still (isometrics). Sit in a supportive chair and place your hand on your forehead. Keep your head and face facing straight ahead. Do not flex or extend your neck while doing isometrics. Push forward with your head and neck while pushing back with your hand. Hold for 10 seconds. Do the sequence again, this time putting your hand against the back of your head. Use your head and neck to push backward against the hand pressure. Finally, do the same exercise on either side of your head, pushing sideways against the pressure of your hand. Repeat __________ times. Complete this exercise __________ times a day. Prone head lifts  Lie face-down (prone position), resting on your elbows so that your chest and upper back are raised. Start with your head facing downward, near your chest. Position your chin either on or near your chest. Slowly lift your head upward. Lift until you are looking straight ahead. Then continue lifting your head as far back as you can comfortably stretch. Hold your head up for 5 seconds. Then slowly lower it to your starting position. Repeat __________ times. Complete this exercise __________ times a day. Supine head lifts  Lie on your back (supine position), bending your knees to point to the ceiling and keeping your feet flat on the floor. Lift your head slowly off the floor, raising your chin toward your chest. Hold for 5  seconds. Repeat __________ times. Complete this exercise __________ times a day. Scapular retraction  Stand with your arms at your sides. Look straight ahead. Slowly pull both shoulders (scapulae) backward and downward (retraction) until you feel a stretch between your shoulder blades in your upper back. Hold for 10-30 seconds. Relax and repeat. Repeat __________ times. Complete this exercise __________ times a day. Contact a health care provider if: Your neck pain or discomfort gets worse when you do an exercise. Your neck  pain or discomfort does not improve within 2 hours after you exercise. If you have any of these problems, stop exercising right away. Do not do the exercises again unless your health care provider says that you can. Get help right away if: You develop sudden, severe neck pain. If this happens, stop exercising right away. Do not do the exercises again unless your health care provider says that you can. This information is not intended to replace advice given to you by your health care provider. Make sure you discuss any questions you have with your health care provider. Document Revised: 05/22/2021 Document Reviewed: 05/22/2021 Elsevier Patient Education  [place].

## 2021-10-23 ENCOUNTER — Encounter: Payer: Self-pay | Admitting: Internal Medicine

## 2021-10-29 ENCOUNTER — Telehealth: Payer: Self-pay | Admitting: Internal Medicine

## 2021-10-29 NOTE — Telephone Encounter (Signed)
Can you tell me please if CT abdomen/pelvis is approved or not got paperwork stating its not but its scheduled 11/16/21 with CT chest  I would like both  Thank you

## 2021-11-09 ENCOUNTER — Encounter: Payer: Self-pay | Admitting: Internal Medicine

## 2021-11-13 ENCOUNTER — Other Ambulatory Visit: Payer: Self-pay | Admitting: Cardiology

## 2021-11-13 DIAGNOSIS — I050 Rheumatic mitral stenosis: Secondary | ICD-10-CM

## 2021-11-15 DIAGNOSIS — Z87891 Personal history of nicotine dependence: Secondary | ICD-10-CM | POA: Diagnosis not present

## 2021-11-15 DIAGNOSIS — E8809 Other disorders of plasma-protein metabolism, not elsewhere classified: Secondary | ICD-10-CM | POA: Diagnosis not present

## 2021-11-15 DIAGNOSIS — Z79899 Other long term (current) drug therapy: Secondary | ICD-10-CM | POA: Diagnosis not present

## 2021-11-15 DIAGNOSIS — D472 Monoclonal gammopathy: Secondary | ICD-10-CM | POA: Diagnosis not present

## 2021-11-15 DIAGNOSIS — G63 Polyneuropathy in diseases classified elsewhere: Secondary | ICD-10-CM | POA: Diagnosis not present

## 2021-11-16 ENCOUNTER — Other Ambulatory Visit: Payer: Self-pay

## 2021-11-16 ENCOUNTER — Ambulatory Visit: Payer: Medicare PPO

## 2021-11-16 ENCOUNTER — Ambulatory Visit
Admission: RE | Admit: 2021-11-16 | Discharge: 2021-11-16 | Disposition: A | Discharge status: Home / Self Care | Payer: Medicare PPO | Source: Ambulatory Visit | Attending: Internal Medicine | Admitting: Internal Medicine

## 2021-11-16 DIAGNOSIS — Z0389 Encounter for observation for other suspected diseases and conditions ruled out: Secondary | ICD-10-CM | POA: Diagnosis not present

## 2021-11-16 DIAGNOSIS — R1084 Generalized abdominal pain: Secondary | ICD-10-CM | POA: Diagnosis not present

## 2021-11-16 DIAGNOSIS — R634 Abnormal weight loss: Secondary | ICD-10-CM | POA: Diagnosis not present

## 2021-11-16 DIAGNOSIS — K449 Diaphragmatic hernia without obstruction or gangrene: Secondary | ICD-10-CM | POA: Diagnosis not present

## 2021-11-16 DIAGNOSIS — R140 Abdominal distension (gaseous): Secondary | ICD-10-CM | POA: Insufficient documentation

## 2021-11-16 DIAGNOSIS — I700 Atherosclerosis of aorta: Secondary | ICD-10-CM | POA: Diagnosis not present

## 2021-11-16 DIAGNOSIS — K5900 Constipation, unspecified: Secondary | ICD-10-CM | POA: Insufficient documentation

## 2021-11-16 DIAGNOSIS — K5730 Diverticulosis of large intestine without perforation or abscess without bleeding: Secondary | ICD-10-CM | POA: Diagnosis not present

## 2021-11-19 ENCOUNTER — Encounter: Payer: Self-pay | Admitting: Internal Medicine

## 2021-11-19 ENCOUNTER — Other Ambulatory Visit: Payer: Self-pay | Admitting: Internal Medicine

## 2021-11-19 DIAGNOSIS — M810 Age-related osteoporosis without current pathological fracture: Secondary | ICD-10-CM

## 2021-11-19 DIAGNOSIS — K5790 Diverticulosis of intestine, part unspecified, without perforation or abscess without bleeding: Secondary | ICD-10-CM | POA: Insufficient documentation

## 2021-11-19 NOTE — Telephone Encounter (Signed)
No orders in for DEXA in Patient chart. Please advise    Order in for Lipid will have Patient schedule a lab appointment before visit.

## 2021-11-20 ENCOUNTER — Encounter: Payer: Self-pay | Admitting: Internal Medicine

## 2021-11-20 DIAGNOSIS — R202 Paresthesia of skin: Secondary | ICD-10-CM | POA: Diagnosis not present

## 2021-11-20 DIAGNOSIS — R200 Anesthesia of skin: Secondary | ICD-10-CM | POA: Diagnosis not present

## 2021-11-21 ENCOUNTER — Ambulatory Visit (INDEPENDENT_AMBULATORY_CARE_PROVIDER_SITE_OTHER): Payer: Medicare PPO

## 2021-11-21 ENCOUNTER — Other Ambulatory Visit: Payer: Self-pay

## 2021-11-21 DIAGNOSIS — I050 Rheumatic mitral stenosis: Secondary | ICD-10-CM

## 2021-11-21 DIAGNOSIS — R030 Elevated blood-pressure reading, without diagnosis of hypertension: Secondary | ICD-10-CM

## 2021-11-21 LAB — ECHOCARDIOGRAM COMPLETE
AR max vel: 1.94 cm2
AV Area VTI: 1.87 cm2
AV Area mean vel: 1.74 cm2
AV Mean grad: 7 mmHg
AV Peak grad: 14.3 mmHg
Ao pk vel: 1.89 m/s
Area-P 1/2: 3.05 cm2
Calc EF: 55.2 %
MV VTI: 2.06 cm2
S' Lateral: 2.3 cm
Single Plane A2C EF: 56.5 %
Single Plane A4C EF: 57.2 %

## 2021-11-23 ENCOUNTER — Telehealth: Payer: Self-pay | Admitting: Emergency Medicine

## 2021-11-23 ENCOUNTER — Ambulatory Visit: Payer: Medicare PPO | Admitting: Internal Medicine

## 2021-11-23 NOTE — Telephone Encounter (Signed)
Pt called to go over results of echo. Pt had no questions or concerns and voiced appreciation for the call.

## 2021-11-23 NOTE — Telephone Encounter (Signed)
-----   Message from Kate Sable, MD sent at 11/22/2021  5:38 PM EST ----- Mild mitral valve stenosis.  Normal systolic function.  Echocardiogram is unchanged from prior.  Continue medications as prescribed.

## 2021-11-26 ENCOUNTER — Ambulatory Visit (INDEPENDENT_AMBULATORY_CARE_PROVIDER_SITE_OTHER): Payer: Medicare PPO | Admitting: Cardiology

## 2021-11-26 ENCOUNTER — Encounter: Payer: Self-pay | Admitting: Cardiology

## 2021-11-26 ENCOUNTER — Other Ambulatory Visit: Payer: Self-pay

## 2021-11-26 VITALS — BP 136/66 | HR 90 | Ht 59.0 in | Wt 130.0 lb

## 2021-11-26 DIAGNOSIS — I050 Rheumatic mitral stenosis: Secondary | ICD-10-CM

## 2021-11-26 DIAGNOSIS — E785 Hyperlipidemia, unspecified: Secondary | ICD-10-CM | POA: Diagnosis not present

## 2021-11-26 DIAGNOSIS — R600 Localized edema: Secondary | ICD-10-CM | POA: Diagnosis not present

## 2021-11-26 NOTE — Patient Instructions (Signed)
Medication Instructions:   Your physician recommends that you continue on your current medications as directed. Please refer to the Current Medication list given to you today.  *If you need a refill on your cardiac medications before your next appointment, please call your pharmacy*   Lab Work:  None ordered  If you have labs (blood work) drawn today and your tests are completely normal, you will receive your results only by: [place] (if you have MyChart) OR A paper copy in the mail If you have any lab test that is abnormal or we need to change your treatment, we will call you to review the results.   Testing/Procedures:  Your physician has requested that you have an echocardiogram in 2 years. Echocardiography is a painless test that uses sound waves to create images of your heart. It provides your doctor with information about the size and shape of your heart and how well your hearts chambers and valves are working. This procedure takes approximately one hour. There are no restrictions for this procedure.    Follow-Up: At [facility], you and your health needs are our priority.  As part of our continuing mission to provide you with exceptional heart care, we have created designated Provider Care Teams.  These Care Teams include your primary Cardiologist (physician) and Advanced Practice Providers (APPs -  Physician Assistants and Nurse Practitioners) who all work together to provide you with the care you need, when you need it.  We recommend signing up for the patient portal called "MyChart".  Sign up information is provided on this After Visit Summary.  MyChart is used to connect with patients for Virtual Visits (Telemedicine).  Patients are able to view lab/test results, encounter notes, upcoming appointments, etc.  Non-urgent messages can be sent to your provider as well.   To learn more about what you can do with MyChart, go to [email or web address].    Your next  appointment:   2 year(s)  The format for your next appointment:   In Person  Provider:   You may see Kate Sable, MD or one of the following Advanced Practice Providers on your designated Care Team:   Murray Hodgkins, NP Christell Faith, PA-C Cadence Kathlen Mody, [place]    Other Instructions

## 2021-11-26 NOTE — Progress Notes (Signed)
Cardiology Office Note:    Date:  11/26/2021   ID:  Carol Stone, DOB Oct 23, 1946, MRN [id number]  PCP:  McLean-Scocuzza, Nino Glow, MD  Cardiologist:  Kate Sable, MD  Electrophysiologist:  None   Referring MD: McLean-Scocuzza, Olivia Mackie *   Chief Complaint  Patient presents with   Other    12 month follow up -- Meds reviewed verbally with patient.     History of Present Illness:    Carol Stone is a 75 y.o. female with a hx of mild mitral stenosis, hyperlipidemia, varicose veins who presents for follow-up.    Being seen due to mitral stenosis and hyperlipidemia.  Mitral valve disease being monitored serially with echocardiograms.  Denies chest pain or shortness of breath.  Cardiothoracic vertebral fracture, recently diagnosed with MGUS, seen by hematologist at [facility].  Plans to follow-up with PCP regarding thoracic vertebrae fracture.  States pain is improved/resolved currently.  Prior notes Echo 11/2021, EF 60%, mild mitral stenosis, mean gradient 3.7 mmHg Echo 11/07/2020 EF 55 to 60%, mild mitral stenosis mean gradient 4 mmHg.    Past Medical History:  Diagnosis Date   Allergy    Arthritis    Cataract    Frequent headaches    seasonal   GERD (gastroesophageal reflux disease)    History of chicken pox    History of colon polyps    Hypercholesteremia    IBS (irritable bowel syndrome)    constipation    OSA on CPAP    Osteoporosis    Sleep apnea    Tinnitus    Venous insufficiency     Past Surgical History:  Procedure Laterality Date   ABDOMINAL HYSTERECTOMY     COLONOSCOPY WITH PROPOFOL N/A 03/14/2016   Procedure: COLONOSCOPY WITH PROPOFOL;  Surgeon: Hulen Luster, MD;  Location: [facility] ENDOSCOPY;  Service: Gastroenterology;  Laterality: N/A;   DILATION AND CURETTAGE OF UTERUS     TONSILLECTOMY      Current Medications: Current Meds  Medication Sig   atorvastatin (LIPITOR) 10 MG tablet Take 1 tablet (10 mg total) by mouth daily at 6 PM.   calcium-vitamin  D (OSCAL WITH D) 500-200 MG-UNIT tablet Take 1 tablet by mouth.   diclofenac sodium (VOLTAREN) 1 % GEL Apply topically 4 (four) times daily. Prn   famotidine (PEPCID) 20 MG tablet Take 20 mg by mouth 2 (two) times daily.   furosemide (LASIX) 40 MG tablet TAKE 1 TABLET (40 MG TOTAL) BY MOUTH EVERY MORNING AS NEEDED.   metroNIDAZOLE (METROCREAM) 0.75 % cream    Multiple Vitamin (MULTIVITAMIN) tablet Take 1 tablet by mouth daily.   polyethylene glycol powder (GLYCOLAX/MIRALAX) powder Take by mouth.   RESTASIS 0.05 % ophthalmic emulsion PLACE 1 DROP INTO BOTH EYES TWICE A DAY   triamcinolone cream (KENALOG) 0.1 % Apply 1 application topically 2 (two) times daily. Prn legs b/l     Allergies:   Baclofen, Bactrim [sulfamethoxazole-trimethoprim], Codeine, Compazine [prochlorperazine edisylate], Demerol [meperidine], Indocin [indomethacin], Other, Reglan [metoclopramide], and Robaxin [methocarbamol]   Social History   Socioeconomic History   Marital status: Married    Spouse name: Not on file   Number of children: Not on file   Years of education: Not on file   Highest education level: Not on file  Occupational History   Not on file  Tobacco Use   Smoking status: Former   Smokeless tobacco: Never   Tobacco comments:    age [age] light   Vaping Use   Vaping Use:  Never used  Substance and Sexual Activity   Alcohol use: No   Drug use: No   Sexual activity: Not Currently    Birth control/protection: Surgical  Other Topics Concern   Not on file  Social History Narrative   Married, husband in house    Wears seat belt, husband owns guns, safe in relationship    No kids    Masters, previous nurse office work    No kids    Retired    Scientist, physiological Strain: Low Risk    Difficulty of Paying Living Expenses: Not hard at all  Food Insecurity: No Food Insecurity   Worried About Charity fundraiser in the Last Year: Never true   Arboriculturist in the  Last Year: Never true  Transportation Needs: No Transportation Needs   Lack of Transportation (Medical): No   Lack of Transportation (Non-Medical): No  Physical Activity: Sufficiently Active   Days of Exercise per Week: 5 days   Minutes of Exercise per Session: 60 min  Stress: No Stress Concern Present   Feeling of Stress : Not at all  Social Connections: Unknown   Frequency of Communication with Friends and Family: More than three times a week   Frequency of Social Gatherings with Friends and Family: More than three times a week   Attends Religious Services: Not on Electrical engineer or Organizations: Not on file   Attends Archivist Meetings: Not on file   Marital Status: Not on file     Family History: The patient's family history includes AAA (abdominal aortic aneurysm) in her paternal grandmother; Breast cancer (age of onset: 41) in her maternal aunt; Cancer in her father, maternal aunt, paternal grandfather, and sister; Deep vein thrombosis in her sister; Heart disease in her maternal grandmother; Hypertension in her father and paternal grandmother; Lung cancer in her sister; Osteoporosis in her mother; Prostate cancer in her father; Pulmonary embolism in her sister; Stroke in her mother.  ROS:   Please see the history of present illness.     All other systems reviewed and are negative.  EKGs/Labs/Other Studies Reviewed:    The following studies were reviewed today:   EKG:  EKG is  ordered today.  The ekg ordered today demonstrates normal sinus rhythm.  Recent Labs: 01/11/2021: ALT 15; BUN 20; Creatinine, Ser 0.92; Hemoglobin 13.4; Platelets 238.0; Potassium 4.3; Sodium 140  Recent Lipid Panel    Component Value Date/Time   CHOL 202 (H) 01/11/2021 0916   TRIG 120.0 01/11/2021 0916   HDL 63.30 01/11/2021 0916   CHOLHDL 3 01/11/2021 0916   VLDL 24.0 01/11/2021 0916   LDLCALC 115 (H) 01/11/2021 0916    Physical Exam:    VS:  BP 136/66 (BP Location:  Left Arm, Patient Position: Sitting, Cuff Size: Normal)    Pulse 90    Ht 4\' 11"  (1.499 m)    Wt 130 lb (59 kg)    SpO2 98%    BMI 26.26 kg/m     Wt Readings from Last 3 Encounters:  11/26/21 130 lb (59 kg)  10/18/21 131 lb 9.6 oz (59.7 kg)  08/22/21 129 lb 6.4 oz (58.7 kg)     GEN:  Well nourished, well developed in no acute distress HEENT: Normal NECK: No JVD; No carotid bruits LYMPHATICS: No lymphadenopathy CARDIAC: RRR, no murmurs, rubs, gallops RESPIRATORY:  Clear to auscultation without rales, wheezing or rhonchi  ABDOMEN: Soft, non-tender, non-distended MUSCULOSKELETAL:  No edema; varicose veins noted SKIN: Warm and dry NEUROLOGIC:  Alert and oriented x 3 PSYCHIATRIC:  Normal affect   ASSESSMENT:    1. Mitral valve stenosis, unspecified etiology   2. Hyperlipidemia, unspecified hyperlipidemia type   3. Leg edema     PLAN:    In order of problems listed above:  Mild mitral stenosis, repeat echo 11/2021 showed normal EF 60%, mild mitral stenosis, mean mitral gradient 3.7 mmHg at heart rate 88 bpm.  Not significantly different from prior.  Continue monitoring with serial echoes.  Reduce frequency to every 2 to 3 years. Hyperlipidemia, continue Lipitor. Varicose veins in lower extremity, no edema.  Compression stockings, leg raising while in seated position advised.  Follow-up in 2 years after repeat echo.  This note was generated in part or whole with voice recognition software. Voice recognition is usually quite accurate but there are transcription errors that can and very often do occur. I apologize for any typographical errors that were not detected and corrected.  Medication Adjustments/Labs and Tests Ordered: Current medicines are reviewed at length with the patient today.  Concerns regarding medicines are outlined above.  Orders Placed This Encounter  Procedures   EKG 12-Lead   ECHOCARDIOGRAM COMPLETE   No orders of the defined types were placed in this  encounter.   Patient Instructions  Medication Instructions:   Your physician recommends that you continue on your current medications as directed. Please refer to the Current Medication list given to you today.  *If you need a refill on your cardiac medications before your next appointment, please call your pharmacy*   Lab Work:  None ordered  If you have labs (blood work) drawn today and your tests are completely normal, you will receive your results only by: [place] (if you have MyChart) OR A paper copy in the mail If you have any lab test that is abnormal or we need to change your treatment, we will call you to review the results.   Testing/Procedures:  Your physician has requested that you have an echocardiogram in 2 years. Echocardiography is a painless test that uses sound waves to create images of your heart. It provides your doctor with information about the size and shape of your heart and how well your hearts chambers and valves are working. This procedure takes approximately one hour. There are no restrictions for this procedure.    Follow-Up: At [facility], you and your health needs are our priority.  As part of our continuing mission to provide you with exceptional heart care, we have created designated Provider Care Teams.  These Care Teams include your primary Cardiologist (physician) and Advanced Practice Providers (APPs -  Physician Assistants and Nurse Practitioners) who all work together to provide you with the care you need, when you need it.  We recommend signing up for the patient portal called "MyChart".  Sign up information is provided on this After Visit Summary.  MyChart is used to connect with patients for Virtual Visits (Telemedicine).  Patients are able to view lab/test results, encounter notes, upcoming appointments, etc.  Non-urgent messages can be sent to your provider as well.   To learn more about what you can do with MyChart, go to  [email or web address].    Your next appointment:   2 year(s)  The format for your next appointment:   In Person  Provider:   You may see Kate Sable, MD or one of the following Advanced  Practice Providers on your designated Care Team:   Murray Hodgkins, NP Christell Faith, PA-C Cadence Kathlen Mody, [place]    Other Instructions     Signed, Kate Sable, MD  11/26/2021 9:24 AM    [place]

## 2021-12-05 ENCOUNTER — Ambulatory Visit: Payer: Medicare PPO | Admitting: Internal Medicine

## 2021-12-06 DIAGNOSIS — H524 Presbyopia: Secondary | ICD-10-CM | POA: Diagnosis not present

## 2021-12-06 DIAGNOSIS — H2513 Age-related nuclear cataract, bilateral: Secondary | ICD-10-CM | POA: Diagnosis not present

## 2021-12-15 ENCOUNTER — Other Ambulatory Visit: Payer: Self-pay | Admitting: Internal Medicine

## 2021-12-15 DIAGNOSIS — R600 Localized edema: Secondary | ICD-10-CM

## 2021-12-20 DIAGNOSIS — G63 Polyneuropathy in diseases classified elsewhere: Secondary | ICD-10-CM | POA: Diagnosis not present

## 2021-12-20 DIAGNOSIS — D472 Monoclonal gammopathy: Secondary | ICD-10-CM | POA: Diagnosis not present

## 2021-12-20 DIAGNOSIS — R600 Localized edema: Secondary | ICD-10-CM | POA: Diagnosis not present

## 2021-12-21 ENCOUNTER — Other Ambulatory Visit: Payer: Self-pay

## 2021-12-21 ENCOUNTER — Other Ambulatory Visit (INDEPENDENT_AMBULATORY_CARE_PROVIDER_SITE_OTHER): Payer: Medicare PPO

## 2021-12-21 DIAGNOSIS — E785 Hyperlipidemia, unspecified: Secondary | ICD-10-CM

## 2021-12-21 LAB — LIPID PANEL
Cholesterol: 190 mg/dL (ref 0–200)
HDL: 63.5 mg/dL (ref >39.00)
LDL Cholesterol: 100 mg/dL — ABNORMAL HIGH (ref 0–99)
NonHDL: 126.98
Total CHOL/HDL Ratio: 3
Triglycerides: 135 mg/dL (ref 0.0–149.0)
VLDL: 27 mg/dL (ref 0.0–40.0)

## 2021-12-26 ENCOUNTER — Other Ambulatory Visit: Payer: Self-pay

## 2021-12-26 ENCOUNTER — Ambulatory Visit (INDEPENDENT_AMBULATORY_CARE_PROVIDER_SITE_OTHER): Payer: Medicare PPO | Admitting: Internal Medicine

## 2021-12-26 ENCOUNTER — Encounter: Payer: Self-pay | Admitting: Internal Medicine

## 2021-12-26 VITALS — BP 122/74 | HR 100 | Temp 97.1°F | Ht 59.0 in | Wt 128.2 lb

## 2021-12-26 DIAGNOSIS — M810 Age-related osteoporosis without current pathological fracture: Secondary | ICD-10-CM | POA: Diagnosis not present

## 2021-12-26 DIAGNOSIS — M542 Cervicalgia: Secondary | ICD-10-CM

## 2021-12-26 DIAGNOSIS — S22000D Wedge compression fracture of unspecified thoracic vertebra, subsequent encounter for fracture with routine healing: Secondary | ICD-10-CM | POA: Insufficient documentation

## 2021-12-26 DIAGNOSIS — S22070D Wedge compression fracture of T9-T10 vertebra, subsequent encounter for fracture with routine healing: Secondary | ICD-10-CM | POA: Diagnosis not present

## 2021-12-26 DIAGNOSIS — E785 Hyperlipidemia, unspecified: Secondary | ICD-10-CM | POA: Diagnosis not present

## 2021-12-26 DIAGNOSIS — D472 Monoclonal gammopathy: Secondary | ICD-10-CM | POA: Diagnosis not present

## 2021-12-26 DIAGNOSIS — G629 Polyneuropathy, unspecified: Secondary | ICD-10-CM

## 2021-12-26 HISTORY — DX: Cervicalgia: M54.2

## 2021-12-26 NOTE — Patient Instructions (Addendum)
DEXA 09/03/16                                         TECHNICIAN:    Clearence Ped  HISTORY:  BASELINE STUDY. 76 YEAR OLD WHITE FEMALE. S/P LUMBAR FRACTURE.   SITE DATE BMD g/cm2   T-SCORE Z-SCORE g/cm2  CHANGE % CHANGE  STATISTICALLY               SIGNIFICANT?  LUMBAR  SPINE L1- L4                     HIP L.FEM.NECK 09/03/16 0.617 -2.1                  L.TOTAL HIP 09/03/16 0.802 -1.1                 FOREARM            L/R 33%   09/03/16 0.532 -2.7       INTERPRETATION:   Lumbar spine not reported due to possible fracture.  Osteoporosis.    FRACTURE RISK ASSESSMENT (FRAX)  _3.6____  10 year risk of hip fracture.      __19___  10 year risk of any  major fracture.   TREATMENT:   The National Osteoporosis Foundation (2008) recommends treatment for:   Patients with hip or vertebral fracture (clinical or morphometric)  Patients with osteoporosis as defined by T score < = -2.5  Postmenopausal women or men age 66 and older with low bone mass (T score  -1.0 to -2.5, osteopenia) at the femoral neck, total hip, or spine and 10  year hip fracture risk probability >3% or a 10 year all major osteoporosis  related fracture probability of >20%.  BMD should be monitored two years after initiating therapy and at two-year  intervals thereafter.   _______________________________________  G. Fayrene Fearing., MD, Julious Payer, [place]            Certified Clinical Densitometrist   The [place] is a Hologic QDR 4500.  [facility] facility and technologist Least Significant Change [facility]):  Technologist  Site LS Spine   Site Hip  S G     0.031 g/cm2    0.040 g/cm2  L R    0.054 g/cm2   0.033 g/cm2  at 95% confidence level  Exam End: -- Last Resulted: 09/03/16 15:57  Received From: [facility]  Result Received: 07/13/20 08:54    Denosumab injection What is this medication? DENOSUMAB (den oh sue mab) slows bone breakdown. Prolia is used to treat  osteoporosis in women after menopause and in men, and in people who are taking corticosteroids for 6 months or more. Delton See is used to treat a high calcium level due to cancer and to prevent bone fractures and other bone problems caused by multiple myeloma or cancer bone metastases. Delton See is also used to treat giant cell tumor of the bone. This medicine may be used for other purposes; ask your health care provider or pharmacist if you have questions. COMMON BRAND NAME(S): Prolia, XGEVA What should I tell my care team before I take this medication? They need to know if you have any of these conditions: dental disease having surgery or tooth extraction infection kidney disease low levels of calcium or Vitamin D in the blood malnutrition on hemodialysis skin conditions or sensitivity  thyroid or parathyroid disease an unusual reaction to denosumab, other medicines, foods, dyes, or preservatives pregnant or trying to get pregnant breast-feeding How should I use this medication? This medicine is for injection under the skin. It is given by a health care professional in a hospital or clinic setting. A special MedGuide will be given to you before each treatment. Be sure to read this information carefully each time. For Prolia, talk to your pediatrician regarding the use of this medicine in children. Special care may be needed. For Delton See, talk to your pediatrician regarding the use of this medicine in children. While this drug may be prescribed for children as young as 13 years for selected conditions, precautions do apply. Overdosage: If you think you have taken too much of this medicine contact a poison control center or emergency room at once. NOTE: This medicine is only for you. Do not share this medicine with others. What if I miss a dose? It is important not to miss your dose. Call your doctor or health care professional if you are unable to keep an appointment. What may interact with this  medication? Do not take this medicine with any of the following medications: other medicines containing denosumab This medicine may also interact with the following medications: medicines that lower your chance of fighting infection steroid medicines like prednisone or cortisone This list may not describe all possible interactions. Give your health care provider a list of all the medicines, herbs, non-prescription drugs, or dietary supplements you use. Also tell them if you smoke, drink alcohol, or use illegal drugs. Some items may interact with your medicine. What should I watch for while using this medication? Visit your doctor or health care professional for regular checks on your progress. Your doctor or health care professional may order blood tests and other tests to see how you are doing. Call your doctor or health care professional for advice if you get a fever, chills or sore throat, or other symptoms of a cold or flu. Do not treat yourself. This drug may decrease your body's ability to fight infection. Try to avoid being around people who are sick. You should make sure you get enough calcium and vitamin D while you are taking this medicine, unless your doctor tells you not to. Discuss the foods you eat and the vitamins you take with your health care professional. See your dentist regularly. Brush and floss your teeth as directed. Before you have any dental work done, tell your dentist you are receiving this medicine. Do not become pregnant while taking this medicine or for 5 months after stopping it. Talk with your doctor or health care professional about your birth control options while taking this medicine. Women should inform their doctor if they wish to become pregnant or think they might be pregnant. There is a potential for serious side effects to an unborn child. Talk to your health care professional or pharmacist for more information. What side effects may I notice from receiving this  medication? Side effects that you should report to your doctor or health care professional as soon as possible: allergic reactions like skin rash, itching or hives, swelling of the face, lips, or tongue bone pain breathing problems dizziness jaw pain, especially after dental work redness, blistering, peeling of the skin signs and symptoms of infection like fever or chills; cough; sore throat; pain or trouble passing urine signs of low calcium like fast heartbeat, muscle cramps or muscle pain; pain, tingling, numbness in the hands  or feet; seizures unusual bleeding or bruising unusually weak or tired Side effects that usually do not require medical attention (report to your doctor or health care professional if they continue or are bothersome): constipation diarrhea headache joint pain loss of appetite muscle pain runny nose tiredness upset stomach This list may not describe all possible side effects. Call your doctor for medical advice about side effects. You may report side effects to FDA at 1-800-FDA-1088. Where should I keep my medication? This medicine is only given in a clinic, doctor's office, or other health care setting and will not be stored at home. NOTE: This sheet is a summary. It may not cover all possible information. If you have questions about this medicine, talk to your doctor, pharmacist, or health care provider.  2022 Elsevier/Gold Standard (2018-04-03 00:00:00)  Zoledronic Acid Injection (Paget's Disease, Osteoporosis) What is this medication? ZOLEDRONIC ACID (ZOE le dron ik AS id) slows calcium loss from bones. It treats Paget's disease and osteoporosis. It may be used in other people at risk for bone loss. This medicine may be used for other purposes; ask your health care provider or pharmacist if you have questions. COMMON BRAND NAME(S): Reclast, Zometa, Zometa Powder What should I tell my care team before I take this medication? They need to know if you have  any of these conditions: bleeding disorder cancer dental disease kidney disease low levels of calcium in the blood low red blood cell counts lung or breathing disease (asthma) receiving steroids like dexamethasone or prednisone an unusual or allergic reaction to zoledronic acid, other medicines, foods, dyes, or preservatives pregnant or trying to get pregnant breast-feeding How should I use this medication? This drug is injected into a vein. It is given by a health care provider in a hospital or clinic setting. A special MedGuide will be given to you before each treatment. Be sure to read this information carefully each time. Talk to your health care provider about the use of this drug in children. Special care may be needed. Overdosage: If you think you have taken too much of this medicine contact a poison control center or emergency room at once. NOTE: This medicine is only for you. Do not share this medicine with others. What if I miss a dose? Keep appointments for follow-up doses. It is important not to miss your dose. Call your health care provider if you are unable to keep an appointment. What may interact with this medication? certain antibiotics given by injection NSAIDs, medicines for pain and inflammation, like ibuprofen or naproxen some diuretics like bumetanide, furosemide teriparatide This list may not describe all possible interactions. Give your health care provider a list of all the medicines, herbs, non-prescription drugs, or dietary supplements you use. Also tell them if you smoke, drink alcohol, or use illegal drugs. Some items may interact with your medicine. What should I watch for while using this medication? Visit your health care provider for regular checks on your progress. It may be some time before you see the benefit from this drug. Some people who take this drug have severe bone, joint, or muscle pain. This drug may also increase your risk for jaw problems or  a broken thigh bone. Tell your health care provider right away if you have severe pain in your jaw, bones, joints, or muscles. Tell you health care provider if you have any pain that does not go away or that gets worse. You should make sure you get enough calcium and vitamin D  while you are taking this drug. Discuss the foods you eat and the vitamins you take with your health care provider. You may need blood work done while you are taking this drug. Tell your dentist and dental surgeon that you are taking this drug. You should not have major dental surgery while on this drug. See your dentist to have a dental exam and fix any dental problems before starting this drug. Take good care of your teeth while on this drug. Make sure you see your dentist for regular follow-up appointments. What side effects may I notice from receiving this medication? Side effects that you should report to your doctor or health care provider as soon as possible: allergic reactions (skin rash, itching or hives; swelling of the face, lips, or tongue) bone pain infection (fever, chills, cough, sore throat, pain or trouble passing urine) jaw pain, especially after dental work joint pain kidney injury (trouble passing urine or change in the amount of urine) low calcium levels (fast heartbeat; muscle cramps or pain; pain, tingling, or numbness in the hands or feet; seizures) low red blood cell counts (trouble breathing; feeling faint; lightheaded, falls; unusually weak or tired) muscle pain palpitations redness, blistering, peeling, or loosening of the skin, including inside the mouth Side effects that usually do not require medical attention (report to your doctor or health care provider if they continue or are bothersome): diarrhea eye irritation, itching, or pain fever general ill feeling or flu-like symptoms headache increase in blood pressure nausea pain, redness, or irritation at site where injected stomach  pain upset stomach This list may not describe all possible side effects. Call your doctor for medical advice about side effects. You may report side effects to FDA at 1-800-FDA-1088. Where should I keep my medication? This drug is given in a hospital or clinic. It will not be stored at home. NOTE: This sheet is a summary. It may not cover all possible information. If you have questions about this medicine, talk to your doctor, pharmacist, or health care provider.  2022 Elsevier/Gold Standard (2021-08-14 00:00:00)

## 2021-12-26 NOTE — Progress Notes (Signed)
Chief Complaint  Patient presents with   Results   F/u  1. T9 compression fx and dexa in 2017 +osteoporosis  Back pain resolved  Dexa sch 01/16/22  2. Hld improved on lipitor 10 mg qhs  3. Chronic neck pain doing exercises and helping declines injection had PT in the past  4. Mgus due to f/u [facility] 05/2022    Review of Systems  Constitutional:  Negative for weight loss.  HENT:  Negative for hearing loss.   Eyes:  Negative for blurred vision.  Respiratory:  Negative for shortness of breath.   Cardiovascular:  Negative for chest pain.  Gastrointestinal:  Negative for abdominal pain and blood in stool.  Genitourinary:  Negative for dysuria.  Musculoskeletal:  Positive for neck pain. Negative for back pain, falls and joint pain.  Skin:  Negative for rash.  Neurological:  Negative for headaches.  Psychiatric/Behavioral:  Negative for depression.   Past Medical History:  Diagnosis Date   Allergy    Arthritis    Cataract    Frequent headaches    seasonal   GERD (gastroesophageal reflux disease)    History of chicken pox    History of colon polyps    Hypercholesteremia    IBS (irritable bowel syndrome)    constipation    OSA on CPAP    Osteoporosis    Sleep apnea    Tinnitus    Venous insufficiency    Past Surgical History:  Procedure Laterality Date   ABDOMINAL HYSTERECTOMY     COLONOSCOPY WITH PROPOFOL N/A 03/14/2016   Procedure: COLONOSCOPY WITH PROPOFOL;  Surgeon: Hulen Luster, MD;  Location: [facility] ENDOSCOPY;  Service: Gastroenterology;  Laterality: N/A;   DILATION AND CURETTAGE OF UTERUS     TONSILLECTOMY     Family History  Problem Relation Age of Onset   Prostate cancer Father    Cancer Father        prostate    Hypertension Father    Lung cancer Sister    Cancer Sister        lung smoker    Deep vein thrombosis Sister    Pulmonary embolism Sister    Breast cancer Maternal Aunt 32   Cancer Maternal Aunt        breast   Stroke Mother    Osteoporosis Mother     Heart disease Maternal Grandmother        CHF   AAA (abdominal aortic aneurysm) Paternal Grandmother    Hypertension Paternal Grandmother    Cancer Paternal Grandfather        prostate   Social History   Socioeconomic History   Marital status: Married    Spouse name: Not on file   Number of children: Not on file   Years of education: Not on file   Highest education level: Not on file  Occupational History   Not on file  Tobacco Use   Smoking status: Former   Smokeless tobacco: Never   Tobacco comments:    age [age] light   Vaping Use   Vaping Use: Never used  Substance and Sexual Activity   Alcohol use: No   Drug use: No   Sexual activity: Not Currently    Birth control/protection: Surgical  Other Topics Concern   Not on file  Social History Narrative   Married, husband in house    Wears seat belt, husband owns guns, safe in relationship    No kids    Masters, previous Marine scientist office work  No kids    Retired    Scientist, physiological Strain: Low Risk    Difficulty of Paying Living Expenses: Not hard at all  Food Insecurity: No Food Insecurity   Worried About Charity fundraiser in the Last Year: Never true   Arboriculturist in the Last Year: Never true  Transportation Needs: No Transportation Needs   Lack of Transportation (Medical): No   Lack of Transportation (Non-Medical): No  Physical Activity: Sufficiently Active   Days of Exercise per Week: 5 days   Minutes of Exercise per Session: 60 min  Stress: No Stress Concern Present   Feeling of Stress : Not at all  Social Connections: Unknown   Frequency of Communication with Friends and Family: More than three times a week   Frequency of Social Gatherings with Friends and Family: More than three times a week   Attends Religious Services: Not on Electrical engineer or Organizations: Not on file   Attends Archivist Meetings: Not on file   Marital Status: Not on  file  Intimate Partner Violence: Not At Risk   Fear of Current or Ex-Partner: No   Emotionally Abused: No   Physically Abused: No   Sexually Abused: No   Current Meds  Medication Sig   atorvastatin (LIPITOR) 10 MG tablet Take 1 tablet (10 mg total) by mouth daily at 6 PM.   calcium-vitamin D (OSCAL WITH D) 500-200 MG-UNIT tablet Take 1 tablet by mouth.   diclofenac sodium (VOLTAREN) 1 % GEL Apply topically 4 (four) times daily. Prn   famotidine (PEPCID) 20 MG tablet Take 20 mg by mouth 2 (two) times daily.   furosemide (LASIX) 40 MG tablet TAKE 1 TABLET (40 MG TOTAL) BY MOUTH EVERY MORNING AS NEEDED.   Multiple Vitamin (MULTIVITAMIN) tablet Take 1 tablet by mouth daily.   RESTASIS 0.05 % ophthalmic emulsion PLACE 1 DROP INTO BOTH EYES TWICE A DAY   Allergies  Allergen Reactions   Baclofen     Not effective    Bactrim [Sulfamethoxazole-Trimethoprim] Hives   Codeine Other (See Comments)    Nervous system reaction insides coming out through skin   Compazine [Prochlorperazine Edisylate] Other (See Comments)   Demerol [Meperidine] Other (See Comments)    Nervous system reaction insides coming out through skin   Indocin [Indomethacin] Other (See Comments)   Other     Onions and garlic     Reglan [Metoclopramide] Other (See Comments)   Robaxin [Methocarbamol]     N/V   Recent Results (from the past 2160 hour(s))  ECHOCARDIOGRAM COMPLETE     Status: None   Collection Time: 11/21/21  8:02 AM  Result Value Ref Range   AR max vel 1.94 cm2   AV Peak grad 14.3 mmHg   Ao pk vel 1.89 m/s   S' Lateral 2.30 cm   Area-P 1/2 3.05 cm2   AV Area VTI 1.87 cm2   AV Mean grad 7.0 mmHg   Single Plane A4C EF 57.2 %   Single Plane A2C EF 56.5 %   Calc EF 55.2 %   AV Area mean vel 1.74 cm2   MV VTI 2.06 cm2  Lipid panel     Status: Abnormal   Collection Time: 12/21/21  8:34 AM  Result Value Ref Range   Cholesterol 190 0 - 200 mg/dL    Comment: ATP III Classification       Desirable:  <  200 mg/dL               Borderline High:  200 - 239 mg/dL          High:  > = 240 mg/dL   Triglycerides 135.0 0.0 - 149.0 mg/dL    Comment: Normal:  <150 mg/dLBorderline High:  150 - 199 mg/dL   HDL 63.50 >39.00 mg/dL   VLDL 27.0 0.0 - 40.0 mg/dL   LDL Cholesterol 100 (H) 0 - 99 mg/dL   Total CHOL/HDL Ratio 3     Comment:                Men          Women1/2 Average Risk     3.4          3.3Average Risk          5.0          4.42X Average Risk          9.6          7.13X Average Risk          15.0          11.0                       NonHDL 126.98     Comment: NOTE:  Non-HDL goal should be 30 mg/dL higher than patient's LDL goal (i.e. LDL goal of < 70 mg/dL, would have non-HDL goal of < 100 mg/dL)   Objective  Body mass index is 25.89 kg/m. Wt Readings from Last 3 Encounters:  12/26/21 128 lb 3.2 oz (58.2 kg)  11/26/21 130 lb (59 kg)  10/18/21 131 lb 9.6 oz (59.7 kg)   Temp Readings from Last 3 Encounters:  12/26/21 (!) 97.1 F (36.2 C) (Temporal)  10/18/21 (!) 96.6 F (35.9 C)  08/22/21 99.2 F (37.3 C) (Oral)   BP Readings from Last 3 Encounters:  12/26/21 122/74  11/26/21 136/66  10/18/21 (!) 148/72   Pulse Readings from Last 3 Encounters:  12/26/21 100  11/26/21 90  10/18/21 (!) 104    Physical Exam Vitals and nursing note reviewed.  Constitutional:      Appearance: Normal appearance. She is well-developed and well-groomed.  HENT:     Head: Normocephalic and atraumatic.  Eyes:     Conjunctiva/sclera: Conjunctivae normal.     Pupils: Pupils are equal, round, and reactive to light.  Cardiovascular:     Rate and Rhythm: Normal rate and regular rhythm.     Heart sounds: Normal heart sounds. No murmur heard. Pulmonary:     Effort: Pulmonary effort is normal.     Breath sounds: Normal breath sounds.  Abdominal:     General: Abdomen is flat. Bowel sounds are normal.     Tenderness: There is no abdominal tenderness.  Musculoskeletal:        General: No tenderness.   Skin:    General: Skin is warm and dry.  Neurological:     General: No focal deficit present.     Mental Status: She is alert and oriented to person, place, and time. Mental status is at baseline.     Cranial Nerves: Cranial nerves 2-12 are intact.     Sensory: Sensation is intact.     Motor: Motor function is intact.     Coordination: Coordination is intact.     Gait: Gait is intact.  Psychiatric:        Attention  and Perception: Attention and perception normal.        Mood and Affect: Mood and affect normal.        Speech: Speech normal.        Behavior: Behavior normal. Behavior is cooperative.        Thought Content: Thought content normal.        Cognition and Memory: Cognition and memory normal.        Judgment: Judgment normal.    Assessment  Plan  Osteoporosis, unspecified osteoporosis type, unspecified pathological fracture presence Compression fracture of T9 vertebra with routine healing, subsequent encounter Consider Dr. Chalmers Cater in [place] or [place]   MGUS (monoclonal gammopathy of unknown significance) [facility] 05/2022   Hyperlipidemia, unspecified hyperlipidemia type  Cervicalgia  Exercise continues prn tylenol and advil   Neuropathy  start alpha lipoic acid 600 mg 1-2 x per neurology  HM Flu shot utd  prevnar utd, utd pna 23 vaccine but consider repeat since >5 years  Tdap had 08/16/16 Had zostervax  covid 4/4 shingrix vaccine per pt had 2/2    Out of age window for pap s/p hysterectomy 1984 for fibroids 1 ovary intact  Mammogram neg 09/06/19 neg 09/27/21 negative   DEXA 09/03/16 +osteoporosis was on oral bisphonates and had jaw necrosis declines other meds for now I.e prolia discussed prev. rec calcium 600 mg 1-2 x per day and D3 1000-2000 IU daily  Declines meds disc strontium supplement otc  Sch 01/16/22  Rec D3 2000 IU daily and calcium 600 mg 1-2 x per day   Colonoscopy h/o polyps last [place] Dr. Candace Cruise diverticulosis 03/14/16 bxs negative will see if needed in  2022 h/o polyps pt does not know type  -appt 02/2021 Dr. Alice Reichert with h/o polyps and having early satiety rec EGD and colonoscopy     Dermatology Dr. Kellie Moor Aks right cheek LN2 saw 2019 f/u 08/2021 f/u 1 year     Former smoker age [age] light smoker    Eye Dr. Merla Riches saw 11/27/18 sch 11/2019  Dentist Dr. Kendell Bane  GI Dr. Alice Reichert   vascular Dr. Trula Slade  Rec healthy diet and exercis e Provider: Dr. Olivia Mackie McLean-Scocuzza-Internal Medicine

## 2021-12-27 NOTE — Addendum Note (Signed)
Addended by: Orland Mustard on: 12/27/2021 04:00 PM   Modules accepted: Orders

## 2021-12-31 ENCOUNTER — Encounter: Payer: Self-pay | Admitting: Internal Medicine

## 2022-01-02 NOTE — Telephone Encounter (Signed)
For your information  

## 2022-01-03 ENCOUNTER — Ambulatory Visit (INDEPENDENT_AMBULATORY_CARE_PROVIDER_SITE_OTHER): Payer: Medicare PPO

## 2022-01-03 VITALS — Ht 59.0 in | Wt 128.0 lb

## 2022-01-03 DIAGNOSIS — Z0000 Encounter for general adult medical examination without abnormal findings: Secondary | ICD-10-CM

## 2022-01-03 NOTE — Patient Instructions (Addendum)
°  Carol Stone , Thank you for taking time to come for your Medicare Wellness Visit. I appreciate your ongoing commitment to your health goals. Please review the following plan we discussed and let me know if I can assist you in the future.   These are the goals we discussed:  Goals       Patient Stated     Follow up with Primary Care Provider (pt-stated)      As needed.         This is a list of the screening recommended for you and due dates:  Health Maintenance  Topic Date Due   Pneumonia Vaccine (2 - PPSV23 if available, else PCV20) 09/13/2017   Tetanus Vaccine  08/16/2026   Flu Shot  Completed   DEXA scan (bone density measurement)  Completed   COVID-19 Vaccine  Completed   Hepatitis C Screening: USPSTF Recommendation to screen - Ages 38-79 yo.  Completed   Zoster (Shingles) Vaccine  Completed   HPV Vaccine  Aged Out   Colon Cancer Screening  Discontinued

## 2022-01-03 NOTE — Progress Notes (Signed)
Subjective:   Carol Stone is a 76 y.o. female who presents for Medicare Annual (Subsequent) preventive examination.  Review of Systems    No ROS.  Medicare Wellness Virtual Visit.  Visual/audio telehealth visit, UTA vital signs.   See social history for additional risk factors.   Cardiac Risk Factors include: advanced age (>52men, >54 women)     Objective:    Today's Vitals   01/03/22 0902  Weight: 128 lb (58.1 kg)  Height: 4\' 11"  (1.499 m)   Body mass index is 25.85 kg/m.  Advanced Directives 01/03/2022 01/02/2021 07/09/2019 06/14/2019 03/03/2016 03/01/2016  Does Patient Have a Medical Advance Directive? Yes Yes Yes Yes Yes Yes  Type of Paramedic of [place];Living will - [place];Living will [place];Living will [place];Living will [place];Living will  Does patient want to make changes to medical advance directive? No - Patient declined No - Patient declined No - Patient declined No - Patient declined - No - Patient declined  Copy of [place] in Chart? No - copy requested - No - copy requested - Yes No - copy requested    Current Medications (verified) Outpatient Encounter Medications as of 01/03/2022  Medication Sig   atorvastatin (LIPITOR) 10 MG tablet Take 1 tablet (10 mg total) by mouth daily at 6 PM.   calcium-vitamin D (OSCAL WITH D) 500-200 MG-UNIT tablet Take 1 tablet by mouth.   diclofenac sodium (VOLTAREN) 1 % GEL Apply topically 4 (four) times daily. Prn   famotidine (PEPCID) 20 MG tablet Take 20 mg by mouth 2 (two) times daily.   furosemide (LASIX) 40 MG tablet TAKE 1 TABLET (40 MG TOTAL) BY MOUTH EVERY MORNING AS NEEDED.   metroNIDAZOLE (METROCREAM) 0.75 % cream  (Patient not taking: Reported on 12/26/2021)   Multiple Vitamin (MULTIVITAMIN) tablet Take 1 tablet by mouth daily.   polyethylene glycol powder (GLYCOLAX/MIRALAX) powder Take by  mouth. (Patient not taking: Reported on 12/26/2021)   RESTASIS 0.05 % ophthalmic emulsion PLACE 1 DROP INTO BOTH EYES TWICE A DAY   triamcinolone cream (KENALOG) 0.1 % Apply 1 application topically 2 (two) times daily. Prn legs b/l (Patient not taking: Reported on 12/26/2021)   No facility-administered encounter medications on file as of 01/03/2022.    Allergies (verified) Baclofen, Bactrim [sulfamethoxazole-trimethoprim], Codeine, Compazine [prochlorperazine edisylate], Demerol [meperidine], Indocin [indomethacin], Other, Reglan [metoclopramide], and Robaxin [methocarbamol]   History: Past Medical History:  Diagnosis Date   Allergy    Arthritis    Cataract    Frequent headaches    seasonal   GERD (gastroesophageal reflux disease)    History of chicken pox    History of colon polyps    Hypercholesteremia    IBS (irritable bowel syndrome)    constipation    OSA on CPAP    Osteoporosis    Sleep apnea    Tinnitus    Venous insufficiency    Past Surgical History:  Procedure Laterality Date   ABDOMINAL HYSTERECTOMY     COLONOSCOPY WITH PROPOFOL N/A 03/14/2016   Procedure: COLONOSCOPY WITH PROPOFOL;  Surgeon: Hulen Luster, MD;  Location: [facility] ENDOSCOPY;  Service: Gastroenterology;  Laterality: N/A;   DILATION AND CURETTAGE OF UTERUS     TONSILLECTOMY     Family History  Problem Relation Age of Onset   Prostate cancer Father    Cancer Father        prostate    Hypertension Father  Lung cancer Sister    Cancer Sister        lung smoker    Deep vein thrombosis Sister    Pulmonary embolism Sister    Breast cancer Maternal Aunt 52   Cancer Maternal Aunt        breast   Stroke Mother    Osteoporosis Mother    Heart disease Maternal Grandmother        CHF   AAA (abdominal aortic aneurysm) Paternal Grandmother    Hypertension Paternal Grandmother    Cancer Paternal Grandfather        prostate   Social History   Socioeconomic History   Marital status: Married    Spouse  name: Not on file   Number of children: Not on file   Years of education: Not on file   Highest education level: Not on file  Occupational History   Not on file  Tobacco Use   Smoking status: Former   Smokeless tobacco: Never   Tobacco comments:    age [age] light   Vaping Use   Vaping Use: Never used  Substance and Sexual Activity   Alcohol use: No   Drug use: No   Sexual activity: Not Currently    Birth control/protection: Surgical  Other Topics Concern   Not on file  Social History Narrative   Married, husband in house    Wears seat belt, husband owns guns, safe in relationship    No kids    Masters, previous Marine scientist office work    No kids    Retired    Scientist, physiological Strain: Low Risk    Difficulty of Paying Living Expenses: Not hard at all  Food Insecurity: No Food Insecurity   Worried About Charity fundraiser in the Last Year: Never true   Arboriculturist in the Last Year: Never true  Transportation Needs: No Transportation Needs   Lack of Transportation (Medical): No   Lack of Transportation (Non-Medical): No  Physical Activity: Sufficiently Active   Days of Exercise per Week: 5 days   Minutes of Exercise per Session: 60 min  Stress: No Stress Concern Present   Feeling of Stress : Not at all  Social Connections: Unknown   Frequency of Communication with Friends and Family: More than three times a week   Frequency of Social Gatherings with Friends and Family: More than three times a week   Attends Religious Services: Not on Electrical engineer or Organizations: Not on file   Attends Archivist Meetings: Not on file   Marital Status: Married    Tobacco Counseling Counseling given: Not Answered Tobacco comments: age [age] light    Clinical Intake:  Pre-visit preparation completed: Yes        Diabetes: No  How often do you need to have someone help you when you read instructions, pamphlets, or  other written materials from your doctor or pharmacy?: 1 - Never    Interpreter Needed?: No      Activities of Daily Living In your present state of health, do you have any difficulty performing the following activities: 01/03/2022  Hearing? N  Vision? N  Difficulty concentrating or making decisions? N  Walking or climbing stairs? N  Dressing or bathing? N  Doing errands, shopping? N  Preparing Food and eating ? N  Using the Toilet? N  In the past six months, have you accidently leaked urine?  N  Do you have problems with loss of bowel control? N  Managing your Medications? N  Managing your Finances? N  Housekeeping or managing your Housekeeping? N  Some recent data might be hidden    Patient Care Team: McLean-Scocuzza, Nino Glow, MD as PCP - General (Internal Medicine) Kate Sable, MD as PCP - Cardiology (Cardiology)  Indicate any recent Medical Services you may have received from other than [facility] providers in the past year (date may be approximate).     Assessment:   This is a routine wellness examination for Carol Stone.  Virtual Visit via Telephone Note  I connected with  Carol Stone on 01/03/22 at  9:00 AM EST by telephone and verified that I am speaking with the correct person using two identifiers.  Persons participating in the virtual visit: patient/Nurse Health Advisor   I discussed the limitations, risks, security and privacy concerns of performing an evaluation and management service by telephone and the availability of in person appointments. The patient expressed understanding and agreed to proceed.  Interactive audio and video telecommunications were attempted between this nurse and patient, however failed, due to patient having technical difficulties OR patient did not have access to video capability.  We continued and completed visit with audio only.  Some vital signs may be absent or patient reported.   Hearing/Vision screen Hearing Screening -  Comments:: Patient is able to hear conversational tones without difficulty.  Vision Screening - Comments:: Wears corrective lenses They have seen their ophthalmologist in the last 12 months.   Dietary issues and exercise activities discussed: Current Exercise Habits: Home exercise routine, Type of exercise: walking, Intensity: Mild Healthy diet Good water intake; 55 ounces daily   Goals Addressed               This Visit's Progress     Patient Stated     Follow up with Primary Care Provider (pt-stated)        As needed.        Depression Screen PHQ 2/9 Scores 01/03/2022 10/18/2021 01/02/2021 07/13/2020 04/06/2020 10/01/2019 07/09/2019  PHQ - 2 Score 0 0 0 0 0 0 0    Fall Risk Fall Risk  01/03/2022 10/18/2021 07/11/2021 01/17/2021 01/02/2021  Falls in the past year? 0 0 1 1 1   Number falls in past yr: - 0 1 0 0  Injury with Fall? - 0 0 1 -  Risk for fall due to : - - History of fall(s) History of fall(s) -  Follow up Falls evaluation completed Falls evaluation completed Falls evaluation completed Falls evaluation completed Falls evaluation completed   ASSISTIVE DEVICES UTILIZED TO PREVENT FALLS: Life alert? No  Use of a cane, walker or w/c? No  Grab bars in the bathroom? Yes  Shower chair or bench in shower? No  Elevated toilet seat or a handicapped toilet? No   TIMED UP AND GO: Was the test performed? No .   Cognitive Function:  Patient is alert and oriented x3.  Enjoys using the  computer, quilting, reading and brain teasers for brain health.    6CIT Screen 07/09/2019  What Year? 0 points  What month? 0 points  What time? 0 points  Count back from 20 0 points  Months in reverse 0 points  Repeat phrase 0 points  Total Score 0    Immunizations Immunization History  Administered Date(s) Administered   Fluad Quad(high Dose 65+) 08/22/2021   Influenza-Unspecified 09/29/2014, 10/04/2015, 10/04/2016, 10/02/2017, 10/08/2018, 09/16/2019, 08/16/2020  Moderna Sars-Covid-2  Vaccination 12/21/2019, 01/21/2020, 10/24/2020   Pfizer Covid-19 Vaccine Bivalent Booster 35yrs & up 08/30/2021   Pneumococcal Conjugate-13 06/30/2014, 09/13/2016   Pneumococcal-Unspecified 08/25/2011   Td 01/17/1999   Tdap 08/16/2016   Zoster Recombinat (Shingrix) 08/25/2018, 10/25/2018   Zoster, Live 09/26/2006   Pneumococcal vaccine status: Due, Education has been provided regarding the importance of this vaccine. Advised may receive this vaccine at local pharmacy or Health Dept. Aware to provide a copy of the vaccination record if obtained from local pharmacy or Health Dept. Verbalized acceptance and understanding. Deferred.   Screening Tests Health Maintenance  Topic Date Due   Pneumonia Vaccine 66+ Years old (2 - PPSV23 if available, else PCV20) 09/13/2017   TETANUS/TDAP  08/16/2026   INFLUENZA VACCINE  Completed   DEXA SCAN  Completed   COVID-19 Vaccine  Completed   Hepatitis C Screening  Completed   Zoster Vaccines- Shingrix  Completed   HPV VACCINES  Aged Out   COLONOSCOPY (Pts 45-41yrs Insurance coverage will need to be confirmed)  Discontinued   Health Maintenance Health Maintenance Due  Topic Date Due   Pneumonia Vaccine 1+ Years old (2 - PPSV23 if available, else PCV20) 09/13/2017   Mammogram- completed 09/27/21.  Bone density- scheduled 01/16/22.  Vision Screening: Recommended annual ophthalmology exams for early detection of glaucoma and other disorders of the eye.  Dental Screening: Recommended annual dental exams for proper oral hygiene  Community Resource Referral / Chronic Care Management: CRR required this visit?  No   CCM required this visit?  No      Plan:   Keep all routine maintenance appointments.   I have personally reviewed and noted the following in the patients chart:   Medical and social history Use of alcohol, tobacco or illicit drugs  Current medications and supplements including opioid prescriptions. Not taking opioid.  Functional  ability and status Nutritional status Physical activity Advanced directives List of other physicians Hospitalizations, surgeries, and ER visits in previous 12 months Vitals Screenings to include cognitive, depression, and falls Referrals and appointments  In addition, I have reviewed and discussed with patient certain preventive protocols, quality metrics, and best practice recommendations. A written personalized care plan for preventive services as well as general preventive health recommendations were provided to patient.     Varney Biles, LPN   [id number]

## 2022-01-15 ENCOUNTER — Encounter: Payer: Self-pay | Admitting: Internal Medicine

## 2022-01-15 NOTE — Telephone Encounter (Signed)
For your information  

## 2022-01-16 ENCOUNTER — Other Ambulatory Visit: Payer: Self-pay

## 2022-01-16 ENCOUNTER — Ambulatory Visit
Admission: RE | Admit: 2022-01-16 | Discharge: 2022-01-16 | Disposition: A | Discharge status: Home / Self Care | Payer: Medicare PPO | Source: Ambulatory Visit | Attending: Internal Medicine | Admitting: Internal Medicine

## 2022-01-16 DIAGNOSIS — M810 Age-related osteoporosis without current pathological fracture: Secondary | ICD-10-CM | POA: Diagnosis not present

## 2022-01-16 DIAGNOSIS — M85851 Other specified disorders of bone density and structure, right thigh: Secondary | ICD-10-CM | POA: Diagnosis not present

## 2022-04-15 ENCOUNTER — Other Ambulatory Visit: Payer: Self-pay | Admitting: Internal Medicine

## 2022-04-15 DIAGNOSIS — R600 Localized edema: Secondary | ICD-10-CM

## 2022-05-22 DIAGNOSIS — M810 Age-related osteoporosis without current pathological fracture: Secondary | ICD-10-CM | POA: Diagnosis not present

## 2022-05-23 DIAGNOSIS — G63 Polyneuropathy in diseases classified elsewhere: Secondary | ICD-10-CM | POA: Diagnosis not present

## 2022-05-23 DIAGNOSIS — D472 Monoclonal gammopathy: Secondary | ICD-10-CM | POA: Diagnosis not present

## 2022-05-23 DIAGNOSIS — E8809 Other disorders of plasma-protein metabolism, not elsewhere classified: Secondary | ICD-10-CM | POA: Diagnosis not present

## 2022-05-24 ENCOUNTER — Ambulatory Visit (INDEPENDENT_AMBULATORY_CARE_PROVIDER_SITE_OTHER): Payer: Medicare PPO | Admitting: Internal Medicine

## 2022-05-24 ENCOUNTER — Encounter: Payer: Self-pay | Admitting: Internal Medicine

## 2022-05-24 ENCOUNTER — Telehealth: Payer: Self-pay | Admitting: *Deleted

## 2022-05-24 VITALS — BP 122/70 | HR 90 | Temp 98.1°F | Resp 14 | Ht 59.0 in | Wt 129.0 lb

## 2022-05-24 DIAGNOSIS — Z9189 Other specified personal risk factors, not elsewhere classified: Secondary | ICD-10-CM | POA: Diagnosis not present

## 2022-05-24 DIAGNOSIS — D472 Monoclonal gammopathy: Secondary | ICD-10-CM | POA: Diagnosis not present

## 2022-05-24 DIAGNOSIS — M5030 Other cervical disc degeneration, unspecified cervical region: Secondary | ICD-10-CM | POA: Diagnosis not present

## 2022-05-24 DIAGNOSIS — M8580 Other specified disorders of bone density and structure, unspecified site: Secondary | ICD-10-CM | POA: Insufficient documentation

## 2022-05-24 DIAGNOSIS — Z1329 Encounter for screening for other suspected endocrine disorder: Secondary | ICD-10-CM | POA: Diagnosis not present

## 2022-05-24 DIAGNOSIS — Z1389 Encounter for screening for other disorder: Secondary | ICD-10-CM | POA: Diagnosis not present

## 2022-05-24 DIAGNOSIS — R7303 Prediabetes: Secondary | ICD-10-CM | POA: Diagnosis not present

## 2022-05-24 DIAGNOSIS — Z8739 Personal history of other diseases of the musculoskeletal system and connective tissue: Secondary | ICD-10-CM | POA: Insufficient documentation

## 2022-05-24 DIAGNOSIS — E559 Vitamin D deficiency, unspecified: Secondary | ICD-10-CM

## 2022-05-24 DIAGNOSIS — E785 Hyperlipidemia, unspecified: Secondary | ICD-10-CM | POA: Diagnosis not present

## 2022-05-24 DIAGNOSIS — Z1231 Encounter for screening mammogram for malignant neoplasm of breast: Secondary | ICD-10-CM

## 2022-05-24 HISTORY — DX: Personal history of other diseases of the musculoskeletal system and connective tissue: Z87.39

## 2022-05-24 HISTORY — DX: Other specified personal risk factors, not elsewhere classified: Z91.89

## 2022-05-24 HISTORY — DX: Other specified disorders of bone density and structure, unspecified site: M85.80

## 2022-05-24 MED ORDER — ATORVASTATIN CALCIUM 10 MG PO TABS: 10.0000 mg | ORAL_TABLET | Freq: Every day | ORAL | 3 refills | Status: DC

## 2022-05-24 NOTE — Telephone Encounter (Signed)
Uploaded demographics & insurance into [employer] portal for benefit verification.  New Id :[id number]  Old Id : DOB:[id number]/21/1949  Note: Pt will need a Vitamin D lab checked prior to starting on Prolia

## 2022-05-24 NOTE — Progress Notes (Signed)
I will add vitamin D to next labs

## 2022-05-24 NOTE — Patient Instructions (Addendum)
Dr. Volanda Napoleon will be here 06/2022 call back and sch 11/23/22 physical  Lidocaine pain patch or salonpas  Voltaren gel or aspercream with lidocaine  Heat/ice  Tylenol   Denosumab injection What is this medication? DENOSUMAB (den oh sue mab) slows bone breakdown. Prolia is used to treat osteoporosis in women after menopause and in men, and in people who are taking corticosteroids for 6 months or more. Delton See is used to treat a high calcium level due to cancer and to prevent bone fractures and other bone problems caused by multiple myeloma or cancer bone metastases. Delton See is also used to treat giant cell tumor of the bone. This medicine may be used for other purposes; ask your health care provider or pharmacist if you have questions. COMMON BRAND NAME(S): Prolia, XGEVA What should I tell my care team before I take this medication? They need to know if you have any of these conditions: dental disease having surgery or tooth extraction infection kidney disease low levels of calcium or Vitamin D in the blood malnutrition on hemodialysis skin conditions or sensitivity thyroid or parathyroid disease an unusual reaction to denosumab, other medicines, foods, dyes, or preservatives pregnant or trying to get pregnant breast-feeding How should I use this medication? This medicine is for injection under the skin. It is given by a health care professional in a hospital or clinic setting. A special MedGuide will be given to you before each treatment. Be sure to read this information carefully each time. For Prolia, talk to your pediatrician regarding the use of this medicine in children. Special care may be needed. For Delton See, talk to your pediatrician regarding the use of this medicine in children. While this drug may be prescribed for children as young as 13 years for selected conditions, precautions do apply. Overdosage: If you think you have taken too much of this medicine contact a poison control center  or emergency room at once. NOTE: This medicine is only for you. Do not share this medicine with others. What if I miss a dose? It is important not to miss your dose. Call your doctor or health care professional if you are unable to keep an appointment. What may interact with this medication? Do not take this medicine with any of the following medications: other medicines containing denosumab This medicine may also interact with the following medications: medicines that lower your chance of fighting infection steroid medicines like prednisone or cortisone This list may not describe all possible interactions. Give your health care provider a list of all the medicines, herbs, non-prescription drugs, or dietary supplements you use. Also tell them if you smoke, drink alcohol, or use illegal drugs. Some items may interact with your medicine. What should I watch for while using this medication? Visit your doctor or health care professional for regular checks on your progress. Your doctor or health care professional may order blood tests and other tests to see how you are doing. Call your doctor or health care professional for advice if you get a fever, chills or sore throat, or other symptoms of a cold or flu. Do not treat yourself. This drug may decrease your body's ability to fight infection. Try to avoid being around people who are sick. You should make sure you get enough calcium and vitamin D while you are taking this medicine, unless your doctor tells you not to. Discuss the foods you eat and the vitamins you take with your health care professional. See your dentist regularly. Brush and floss your  teeth as directed. Before you have any dental work done, tell your dentist you are receiving this medicine. Do not become pregnant while taking this medicine or for 5 months after stopping it. Talk with your doctor or health care professional about your birth control options while taking this medicine. Women  should inform their doctor if they wish to become pregnant or think they might be pregnant. There is a potential for serious side effects to an unborn child. Talk to your health care professional or pharmacist for more information. What side effects may I notice from receiving this medication? Side effects that you should report to your doctor or health care professional as soon as possible: allergic reactions like skin rash, itching or hives, swelling of the face, lips, or tongue bone pain breathing problems dizziness jaw pain, especially after dental work redness, blistering, peeling of the skin signs and symptoms of infection like fever or chills; cough; sore throat; pain or trouble passing urine signs of low calcium like fast heartbeat, muscle cramps or muscle pain; pain, tingling, numbness in the hands or feet; seizures unusual bleeding or bruising unusually weak or tired Side effects that usually do not require medical attention (report to your doctor or health care professional if they continue or are bothersome): constipation diarrhea headache joint pain loss of appetite muscle pain runny nose tiredness upset stomach This list may not describe all possible side effects. Call your doctor for medical advice about side effects. You may report side effects to FDA at 1-800-FDA-1088. Where should I keep my medication? This medicine is only given in a clinic, doctor's office, or other health care setting and will not be stored at home. NOTE: This sheet is a summary. It may not cover all possible information. If you have questions about this medicine, talk to your doctor, pharmacist, or health care provider.  2023 Elsevier/Gold Standard (2018-04-03 00:00:00)

## 2022-05-24 NOTE — Progress Notes (Signed)
Chief Complaint  Patient presents with   Follow-up    Disc recent visit with [facility].   F/u  1. Chronic neck pain due to DDD declines tramadol allergic  2. Mgus f/u [facility] Q 6 months  3. Osteoporosis hx now osteopenia with high FRAX score hip pt saw Dr. Hartford Poli endocrine wants to do prolia instead of reclast due to kidney side effect PE with reclast   Review of Systems  Constitutional:  Negative for weight loss.  HENT:  Negative for hearing loss.   Eyes:  Negative for blurred vision.  Respiratory:  Negative for shortness of breath.   Cardiovascular:  Negative for chest pain.  Gastrointestinal:  Negative for abdominal pain and blood in stool.  Genitourinary:  Negative for dysuria.  Musculoskeletal:  Positive for neck pain. Negative for falls and joint pain.  Skin:  Negative for rash.  Neurological:  Negative for headaches.  Psychiatric/Behavioral:  Negative for depression.    Past Medical History:  Diagnosis Date   Allergy    Arthritis    Cataract    Frequent headaches    seasonal   GERD (gastroesophageal reflux disease)    History of chicken pox    History of colon polyps    Hypercholesteremia    IBS (irritable bowel syndrome)    constipation    OSA on CPAP    Osteoporosis    Sleep apnea    Tinnitus    Venous insufficiency    Past Surgical History:  Procedure Laterality Date   ABDOMINAL HYSTERECTOMY     COLONOSCOPY WITH PROPOFOL N/A 03/14/2016   Procedure: COLONOSCOPY WITH PROPOFOL;  Surgeon: Hulen Luster, MD;  Location[facility] ENDOSCOPY;  Service: Gastroenterology;  Laterality: N/A;   DILATION AND CURETTAGE OF UTERUS     TONSILLECTOMY     Family History  Problem Relation Age of Onset   Prostate cancer Father    Cancer Father        prostate    Hypertension Father    Lung cancer Sister    Cancer Sister        lung smoker    Deep vein thrombosis Sister    Pulmonary embolism Sister    Breast cancer Maternal Aunt 47   Cancer Maternal Aunt         breast   Stroke Mother    Osteoporosis Mother    Heart disease Maternal Grandmother        CHF   AAA (abdominal aortic aneurysm) Paternal Grandmother    Hypertension Paternal Grandmother    Cancer Paternal Grandfather        prostate   Social History   Socioeconomic History   Marital status: Married    Spouse name: Not on file   Number of children: Not on file   Years of education: Not on file   Highest education level: Not on file  Occupational History   Not on file  Tobacco Use   Smoking status: Former   Smokeless tobacco: Never   Tobacco comments:    age [age] light   Vaping Use   Vaping Use: Never used  Substance and Sexual Activity   Alcohol use: No   Drug use: No   Sexual activity: Not Currently    Birth control/protection: Surgical  Other Topics Concern   Not on file  Social History Narrative   Married, husband in house    Wears seat belt, husband owns guns, safe in relationship    No  kids    Masters, previous nurse office work    No kids    Retired    Scientist, physiological Strain: [place]  (01/03/2022)   Overall Financial Resource Strain (CARDIA)    Difficulty of Paying Living Expenses: Not hard at all  Food Insecurity: No Food Insecurity (01/03/2022)   Hunger Vital Sign    Worried About Running Out of Food in the Last Year: Never true    [place] in the Last Year: Never true  Transportation Needs: No Transportation Needs (01/03/2022)   PRAPARE - Hydrologist (Medical): No    Lack of Transportation (Non-Medical): No  Physical Activity: Sufficiently Active (01/03/2022)   Exercise Vital Sign    Days of Exercise per Week: 5 days    Minutes of Exercise per Session: 60 min  Stress: No Stress Concern Present (01/03/2022)   [place]    Feeling of Stress : Not at all  Social Connections: Unknown (01/03/2022)   Social Connection  and Isolation Panel [NHANES]    Frequency of Communication with Friends and Family: More than three times a week    Frequency of Social Gatherings with Friends and Family: More than three times a week    Attends Religious Services: Not on file    Active Member of Clubs or Organizations: Not on file    Attends Archivist Meetings: Not on file    Marital Status: Married  Intimate Partner Violence: Not At Risk (01/03/2022)   Humiliation, Afraid, Rape, and Kick questionnaire    Fear of Current or Ex-Partner: No    Emotionally Abused: No    Physically Abused: No    Sexually Abused: No   Current Meds  Medication Sig   calcium-vitamin D (OSCAL WITH D) 500-200 MG-UNIT tablet Take 1 tablet by mouth.   diclofenac sodium (VOLTAREN) 1 % GEL Apply topically 4 (four) times daily. Prn   famotidine (PEPCID) 20 MG tablet Take 20 mg by mouth 2 (two) times daily.   furosemide (LASIX) 40 MG tablet TAKE 1 TABLET (40 MG TOTAL) BY MOUTH EVERY MORNING AS NEEDED.   Multiple Vitamin (MULTIVITAMIN) tablet Take 1 tablet by mouth daily.   RESTASIS 0.05 % ophthalmic emulsion PLACE 1 DROP INTO BOTH EYES TWICE A DAY   [DISCONTINUED] atorvastatin (LIPITOR) 10 MG tablet Take 1 tablet (10 mg total) by mouth daily at 6 PM.   Allergies  Allergen Reactions   Baclofen     Not effective    Bactrim [Sulfamethoxazole-Trimethoprim] Hives   Codeine Other (See Comments)    Nervous system reaction insides coming out through skin   Compazine [Prochlorperazine Edisylate] Other (See Comments)   Demerol [Meperidine] Other (See Comments)    Nervous system reaction insides coming out through skin   Indocin [Indomethacin] Other (See Comments)   Other     Onions and garlic     Reglan [Metoclopramide] Other (See Comments)   Robaxin [Methocarbamol]     N/V   No results found for this or any previous visit (from the past 2160 hour(s)). Objective  Body mass index is 26.05 kg/m. Wt Readings from Last 3 Encounters:   05/24/22 129 lb (58.5 kg)  01/03/22 128 lb (58.1 kg)  12/26/21 128 lb 3.2 oz (58.2 kg)   Temp Readings from Last 3 Encounters:  05/24/22 98.1 F (36.7 C) (Oral)  12/26/21 (!) 97.1 F (36.2 C) (  Temporal)  10/18/21 (!) 96.6 F (35.9 C)   BP Readings from Last 3 Encounters:  05/24/22 122/70  12/26/21 122/74  11/26/21 136/66   Pulse Readings from Last 3 Encounters:  05/24/22 90  12/26/21 100  11/26/21 90    Physical Exam Vitals and nursing note reviewed.  Constitutional:      Appearance: Normal appearance. She is well-developed and well-groomed.  HENT:     Head: Normocephalic and atraumatic.  Eyes:     Conjunctiva/sclera: Conjunctivae normal.     Pupils: Pupils are equal, round, and reactive to light.  Cardiovascular:     Rate and Rhythm: Normal rate and regular rhythm.     Heart sounds: Normal heart sounds. No murmur heard. Pulmonary:     Effort: Pulmonary effort is normal.     Breath sounds: Normal breath sounds.  Abdominal:     General: Abdomen is flat. Bowel sounds are normal.     Tenderness: There is no abdominal tenderness.  Musculoskeletal:        General: No tenderness.  Skin:    General: Skin is warm and dry.  Neurological:     General: No focal deficit present.     Mental Status: She is alert and oriented to person, place, and time. Mental status is at baseline.     Cranial Nerves: Cranial nerves 2-12 are intact.     Motor: Motor function is intact.     Coordination: Coordination is intact.     Gait: Gait is intact.  Psychiatric:        Attention and Perception: Attention and perception normal.        Mood and Affect: Mood and affect normal.        Speech: Speech normal.        Behavior: Behavior normal. Behavior is cooperative.        Thought Content: Thought content normal.        Cognition and Memory: Cognition and memory normal.        Judgment: Judgment normal.     Assessment  Plan  MGUS (monoclonal gammopathy of unknown  significance) Seen 05/23/22 [facility] f/u Q 66months   Hyperlipidemia, unspecified hyperlipidemia type - Plan: atorvastatin (LIPITOR) 10 MG tablet, Lipid panel   DDD (degenerative disc disease), cervical Lidocaine pain patch or salonpas  Voltaren gel or aspercream with lidocaine  Heat/ice  Tylenol  Allergic to codeine     Osteopenia, unspecified location Fracture Risk Assessment Score (FRAX) indicating greater than 3% risk for hip fracture History of osteoporosis  Will get prolia approve  HM-cpe 11/2022 Flu shot utd  prevnar utd, utd pna 23 vaccine  Tdap had 08/16/16 Had zostervax  covid 4/4 shingrix vaccine per pt had 2/2    Out of age window for pap s/p hysterectomy 1984 for fibroids 1 ovary intact   Mammogram neg 09/06/19 neg 09/27/21 negative ordered   DEXA 09/03/16 +osteoporosis was on oral bisphonates and had jaw necrosis declines other meds for now I.e prolia discussed prev. rec calcium 600 mg 1-2 x per day and D3 1000-2000 IU daily  Declines meds disc strontium supplement otc    [place] 03/14[place]16 bxs negative will see if needed in 2022 h/o polyps pt does not know type  -appt 02/2021 Dr. TAlice Reichertwith h/o polyps and having early satiety rec EGD and colonoscopy F/u 5 years colonoscopy     Dermatology Dr. IKellie MoorAks right cheek LN2 saw 2019 f/u 08/2021 f/u  1 years   Former smoker age [age] light smoker    Eye Dr. Merla Riches due 11/2022  Dentist Dr. Kendell Bane > Dr. Wendall Stade   GI Dr. Alice Reichert   vascular Dr. Trula Slade Provider: Dr. Olivia Mackie McLean-Scocuzza-Internal Medicine

## 2022-05-24 NOTE — Addendum Note (Signed)
Addended by: Orland Mustard on: 05/24/2022 12:59 PM   Modules accepted: Orders

## 2022-05-27 ENCOUNTER — Telehealth: Payer: Self-pay | Admitting: Internal Medicine

## 2022-05-27 NOTE — Telephone Encounter (Signed)
Patient dropped off a Medical Clearance form for [facility] to be signed. Patient is aware that provider is on vacation. When ready please mail out for patient. Form with self addressed envelope upfront in Dr Claris Gladden color folder.

## 2022-05-29 NOTE — Telephone Encounter (Signed)
Benefits received:  Patient responsible for 4% OOP Cost + 4% admin fee= $61.20

## 2022-06-05 NOTE — Telephone Encounter (Signed)
Received Prolia Prior Auth from [facility]  Approval# [id number] Eff: 06/08/22-12/08/2022  PA scanned into chart  See below for amount due

## 2022-06-05 NOTE — Telephone Encounter (Signed)
Patient has lab appt on 7/14 that will include a Vit D check needed before starting Prolia.  Pt has been scheduled for 1st Prolia injection on 7/19 @ 8:30am.  Pt aware of amount due of $61.20 at the time of injection.

## 2022-06-14 DIAGNOSIS — L821 Other seborrheic keratosis: Secondary | ICD-10-CM | POA: Diagnosis not present

## 2022-06-14 DIAGNOSIS — R202 Paresthesia of skin: Secondary | ICD-10-CM | POA: Diagnosis not present

## 2022-06-20 ENCOUNTER — Encounter: Payer: Self-pay | Admitting: Internal Medicine

## 2022-06-20 DIAGNOSIS — S52572A Other intraarticular fracture of lower end of left radius, initial encounter for closed fracture: Secondary | ICD-10-CM | POA: Diagnosis not present

## 2022-06-20 DIAGNOSIS — S6992XA Unspecified injury of left wrist, hand and finger(s), initial encounter: Secondary | ICD-10-CM | POA: Diagnosis not present

## 2022-06-20 DIAGNOSIS — S52592A Other fractures of lower end of left radius, initial encounter for closed fracture: Secondary | ICD-10-CM | POA: Diagnosis not present

## 2022-06-21 ENCOUNTER — Other Ambulatory Visit (INDEPENDENT_AMBULATORY_CARE_PROVIDER_SITE_OTHER): Payer: Medicare PPO

## 2022-06-21 DIAGNOSIS — E785 Hyperlipidemia, unspecified: Secondary | ICD-10-CM | POA: Diagnosis not present

## 2022-06-21 DIAGNOSIS — E559 Vitamin D deficiency, unspecified: Secondary | ICD-10-CM

## 2022-06-21 DIAGNOSIS — Z1329 Encounter for screening for other suspected endocrine disorder: Secondary | ICD-10-CM | POA: Diagnosis not present

## 2022-06-21 DIAGNOSIS — R7303 Prediabetes: Secondary | ICD-10-CM | POA: Diagnosis not present

## 2022-06-21 DIAGNOSIS — Z1389 Encounter for screening for other disorder: Secondary | ICD-10-CM

## 2022-06-21 LAB — LIPID PANEL
Cholesterol: 196 mg/dL (ref 0–200)
HDL: 70.2 mg/dL (ref >39.00)
LDL Cholesterol: 100 mg/dL — ABNORMAL HIGH (ref 0–99)
NonHDL: 126.16
Total CHOL/HDL Ratio: 3
Triglycerides: 132 mg/dL (ref 0.0–149.0)
VLDL: 26.4 mg/dL (ref 0.0–40.0)

## 2022-06-21 LAB — TSH: TSH: 1.77 u[IU]/mL (ref 0.35–5.50)

## 2022-06-21 LAB — HEMOGLOBIN A1C: Hgb A1c MFr Bld: 6 % (ref 4.6–6.5)

## 2022-06-21 LAB — VITAMIN D 25 HYDROXY (VIT D DEFICIENCY, FRACTURES): VITD: 42.44 ng/mL (ref 30.00–100.00)

## 2022-06-21 NOTE — Addendum Note (Signed)
Addended by: Leeanne Rio on: 06/21/2022 08:45 AM   Modules accepted: Orders

## 2022-06-25 ENCOUNTER — Encounter: Payer: Self-pay | Admitting: Internal Medicine

## 2022-06-25 DIAGNOSIS — M79602 Pain in left arm: Secondary | ICD-10-CM | POA: Diagnosis not present

## 2022-06-25 DIAGNOSIS — S52502A Unspecified fracture of the lower end of left radius, initial encounter for closed fracture: Secondary | ICD-10-CM | POA: Diagnosis not present

## 2022-06-25 DIAGNOSIS — S62102A Fracture of unspecified carpal bone, left wrist, initial encounter for closed fracture: Secondary | ICD-10-CM | POA: Insufficient documentation

## 2022-06-26 ENCOUNTER — Ambulatory Visit: Payer: Medicare PPO | Admitting: *Deleted

## 2022-06-26 DIAGNOSIS — Z8739 Personal history of other diseases of the musculoskeletal system and connective tissue: Secondary | ICD-10-CM

## 2022-06-26 DIAGNOSIS — Z1389 Encounter for screening for other disorder: Secondary | ICD-10-CM | POA: Diagnosis not present

## 2022-06-26 DIAGNOSIS — Z9189 Other specified personal risk factors, not elsewhere classified: Secondary | ICD-10-CM | POA: Diagnosis not present

## 2022-06-26 MED ORDER — DENOSUMAB 60 MG/ML ~~LOC~~ SOSY
60.0000 mg | PREFILLED_SYRINGE | Freq: Once | SUBCUTANEOUS | Status: AC
  Administered 2022-06-26: 60 mg via SUBCUTANEOUS

## 2022-06-26 NOTE — Progress Notes (Signed)
Pt received first Prolia injection in right arm. PT was observed for 15 minutes & tolerated medication & injection well. Pt will schedule next Prolia for 6 months.

## 2022-06-27 LAB — URINALYSIS, ROUTINE W REFLEX MICROSCOPIC
Bilirubin Urine: NEGATIVE
Glucose, UA: NEGATIVE
Hgb urine dipstick: NEGATIVE
Ketones, ur: NEGATIVE
Leukocytes,Ua: NEGATIVE
Nitrite: NEGATIVE
Protein, ur: NEGATIVE
Specific Gravity, Urine: 1.018 (ref 1.001–1.035)
pH: 8 (ref 5.0–8.0)

## 2022-06-28 DIAGNOSIS — H903 Sensorineural hearing loss, bilateral: Secondary | ICD-10-CM | POA: Diagnosis not present

## 2022-06-28 DIAGNOSIS — J301 Allergic rhinitis due to pollen: Secondary | ICD-10-CM | POA: Diagnosis not present

## 2022-07-04 DIAGNOSIS — S52502D Unspecified fracture of the lower end of left radius, subsequent encounter for closed fracture with routine healing: Secondary | ICD-10-CM | POA: Diagnosis not present

## 2022-07-23 DIAGNOSIS — S52502D Unspecified fracture of the lower end of left radius, subsequent encounter for closed fracture with routine healing: Secondary | ICD-10-CM | POA: Diagnosis not present

## 2022-08-13 DIAGNOSIS — S52502D Unspecified fracture of the lower end of left radius, subsequent encounter for closed fracture with routine healing: Secondary | ICD-10-CM | POA: Diagnosis not present

## 2022-08-14 DIAGNOSIS — L821 Other seborrheic keratosis: Secondary | ICD-10-CM | POA: Diagnosis not present

## 2022-08-14 DIAGNOSIS — D225 Melanocytic nevi of trunk: Secondary | ICD-10-CM | POA: Diagnosis not present

## 2022-08-14 DIAGNOSIS — D2271 Melanocytic nevi of right lower limb, including hip: Secondary | ICD-10-CM | POA: Diagnosis not present

## 2022-08-14 DIAGNOSIS — D2261 Melanocytic nevi of right upper limb, including shoulder: Secondary | ICD-10-CM | POA: Diagnosis not present

## 2022-08-14 DIAGNOSIS — D2272 Melanocytic nevi of left lower limb, including hip: Secondary | ICD-10-CM | POA: Diagnosis not present

## 2022-08-14 DIAGNOSIS — L814 Other melanin hyperpigmentation: Secondary | ICD-10-CM | POA: Diagnosis not present

## 2022-08-14 DIAGNOSIS — D2262 Melanocytic nevi of left upper limb, including shoulder: Secondary | ICD-10-CM | POA: Diagnosis not present

## 2022-08-20 DIAGNOSIS — R0602 Shortness of breath: Secondary | ICD-10-CM | POA: Diagnosis not present

## 2022-09-12 ENCOUNTER — Encounter: Payer: Medicare PPO | Admitting: Family Medicine

## 2022-09-19 DIAGNOSIS — M25532 Pain in left wrist: Secondary | ICD-10-CM | POA: Diagnosis not present

## 2022-09-19 DIAGNOSIS — G5602 Carpal tunnel syndrome, left upper limb: Secondary | ICD-10-CM | POA: Diagnosis not present

## 2022-09-19 DIAGNOSIS — S63592A Other specified sprain of left wrist, initial encounter: Secondary | ICD-10-CM | POA: Diagnosis not present

## 2022-09-21 DIAGNOSIS — G5602 Carpal tunnel syndrome, left upper limb: Secondary | ICD-10-CM | POA: Insufficient documentation

## 2022-09-21 DIAGNOSIS — S63592A Other specified sprain of left wrist, initial encounter: Secondary | ICD-10-CM | POA: Insufficient documentation

## 2022-09-27 DIAGNOSIS — M25532 Pain in left wrist: Secondary | ICD-10-CM | POA: Diagnosis not present

## 2022-09-27 DIAGNOSIS — G5602 Carpal tunnel syndrome, left upper limb: Secondary | ICD-10-CM | POA: Diagnosis not present

## 2022-09-30 ENCOUNTER — Encounter: Payer: Self-pay | Admitting: Family Medicine

## 2022-09-30 ENCOUNTER — Ambulatory Visit
Admission: RE | Admit: 2022-09-30 | Discharge: 2022-09-30 | Disposition: A | Discharge status: Home / Self Care | Payer: Medicare PPO | Source: Ambulatory Visit | Attending: Internal Medicine | Admitting: Internal Medicine

## 2022-09-30 ENCOUNTER — Ambulatory Visit (INDEPENDENT_AMBULATORY_CARE_PROVIDER_SITE_OTHER): Payer: Medicare PPO | Admitting: Family Medicine

## 2022-09-30 VITALS — BP 114/72 | HR 83 | Temp 97.6°F | Ht 59.0 in | Wt 134.2 lb

## 2022-09-30 DIAGNOSIS — N1831 Chronic kidney disease, stage 3a: Secondary | ICD-10-CM

## 2022-09-30 DIAGNOSIS — I739 Peripheral vascular disease, unspecified: Secondary | ICD-10-CM

## 2022-09-30 DIAGNOSIS — Z1231 Encounter for screening mammogram for malignant neoplasm of breast: Secondary | ICD-10-CM | POA: Diagnosis not present

## 2022-09-30 DIAGNOSIS — Z23 Encounter for immunization: Secondary | ICD-10-CM | POA: Diagnosis not present

## 2022-09-30 DIAGNOSIS — Z79899 Other long term (current) drug therapy: Secondary | ICD-10-CM | POA: Diagnosis not present

## 2022-09-30 DIAGNOSIS — D472 Monoclonal gammopathy: Secondary | ICD-10-CM

## 2022-09-30 DIAGNOSIS — E785 Hyperlipidemia, unspecified: Secondary | ICD-10-CM | POA: Diagnosis not present

## 2022-09-30 DIAGNOSIS — C9000 Multiple myeloma not having achieved remission: Secondary | ICD-10-CM | POA: Diagnosis not present

## 2022-09-30 DIAGNOSIS — R600 Localized edema: Secondary | ICD-10-CM | POA: Diagnosis not present

## 2022-09-30 DIAGNOSIS — I050 Rheumatic mitral stenosis: Secondary | ICD-10-CM

## 2022-09-30 NOTE — Progress Notes (Addendum)
    SUBJECTIVE:   CHIEF COMPLAINT / HPI: transfer of care  Patient presents to clinic to transfer care  No acute concerns  Leg edema Patient taking Lasix 40 mg daily for leg edema.  Denies any shortness of breath, chest pain or palpitations.  Has not used compression stocking.  No history of Heart Failure.  Uses diclofenac gel as needed.  No oral NSAID use.  Hydrating well, about 54 oz daily.      HLD On statin.  Tolerating well. Reports no myalgias.   PERTINENT  PMH / PSH:  HLD Bilateral lower extremity edema  OBJECTIVE:   BP 114/72 (BP Location: Left Arm, Patient Position: Sitting, Cuff Size: Normal)   Pulse 83   Temp 97.6 F (36.4 C) (Oral)   Ht '4\' 11"'$  (1.499 m)   Wt 134 lb 3.2 oz (60.9 kg)   SpO2 99%   BMI 27.11 kg/m    General: Alert, no acute distress Cardio: Normal S1 and S2, RRR,  low mid diastolic murmur at the apex Pulm: CTAB, normal work of breathing Abdomen: Bowel sounds normal. Abdomen soft and non-tender.  Extremities: Trace peripheral edema bilaterally Neuro: Cranial nerves grossly intact   ASSESSMENT/PLAN:   Leg edema Chronic, stable.  Currently taking Lasix 40 mg daily. No signs of overload.   -Recommend monitoring intake and output for 24 hours.   -Decrease Lasix to 20 mg daily -Recheck intake and output.   -Monitor weight -Recommend compression stockings -If edema increases or shortness of breath can resume Lasix 40 mg daily. -Follow up in 2 weeks or sooner if symptoms worsen   MGUS (monoclonal gammopathy of unknown significance) Chronic, stable Follows with Heme/Onc  CKD (chronic kidney disease), stage III Monitor renal functions Currently on Lasix 40 daily BMet today Follow up in 2 weeks  Hyperlipidemia On statin and tolerating well. No myalgias Continue Lipitor 10  mg daily Repeat fasting Lipids at annual visit  Mitral stenosis Mild Mitral stenosis on previous ECHO.  Follows with Cardiology Recommend repeat ECHO every 2-3  years. Follow up with Cardiology after next ECHO per Cards note 11/26/21  HCM PCV 20 vaccine today Colonoscopy UTD, sessile polyp repeat in 5 years. Due 02/2027 Mammogram UTD Flu vaccine 09/24/22 PDMP Reviewed  Carollee Leitz, MD

## 2022-09-30 NOTE — Patient Instructions (Addendum)
It was a pleasure meeting you today. Thank you for allowing me to take part in your health care.  Our goals for today as we discussed include:  For your leg swelling Record all fluid intake starting at 6 am tomorrow morning. Record all urine output starting after first void tomorrow morning.  Continue Lasix 40 mg daily as needed Will check your electrolytes and kidney function today   Recommend compression stockings daily  For your mitral valve stenosis Follow up with Cardiology as scheduled Repeat ECHO has been ordered previously for Jan 2024.  Can reach out to Cardiology to schedule appointment   You have received the Pneumonia   Please follow-up with PCP in 2 weeks  If you have any questions or concerns, please do not hesitate to call the office at [phone].  I look forward to our next visit and until then take care and stay safe.  Regards,   Carollee Leitz, MD   [facility]

## 2022-10-01 LAB — BASIC METABOLIC PANEL
BUN: 19 mg/dL (ref 6–23)
CO2: 29 mEq/L (ref 19–32)
Calcium: 9.1 mg/dL (ref 8.4–10.5)
Chloride: 105 mEq/L (ref 96–112)
Creatinine, Ser: 0.97 mg/dL (ref 0.40–1.20)
GFR: 56.55 mL/min — ABNORMAL LOW (ref >60.00)
Glucose, Bld: 112 mg/dL — ABNORMAL HIGH (ref 70–99)
Potassium: 4.1 mEq/L (ref 3.5–5.1)
Sodium: 140 mEq/L (ref 135–145)

## 2022-10-02 ENCOUNTER — Encounter: Payer: Self-pay | Admitting: Family Medicine

## 2022-10-02 DIAGNOSIS — M25532 Pain in left wrist: Secondary | ICD-10-CM | POA: Diagnosis not present

## 2022-10-02 DIAGNOSIS — G5602 Carpal tunnel syndrome, left upper limb: Secondary | ICD-10-CM | POA: Diagnosis not present

## 2022-10-02 NOTE — Telephone Encounter (Signed)
LMTCB

## 2022-10-02 NOTE — Telephone Encounter (Signed)
Spoke to Patient with Dr. Loistine Chance recommendations and she verbalized understanding and is agreeable. Patient will let us know the outcome of the I&O.

## 2022-10-05 ENCOUNTER — Encounter: Payer: Self-pay | Admitting: Family Medicine

## 2022-10-05 DIAGNOSIS — C9000 Multiple myeloma not having achieved remission: Secondary | ICD-10-CM | POA: Insufficient documentation

## 2022-10-05 NOTE — Assessment & Plan Note (Signed)
Chronic, stable Follows with Heme/Onc

## 2022-10-05 NOTE — Assessment & Plan Note (Signed)
Chronic, stable.  Currently taking Lasix 40 mg daily. No signs of overload.   -Recommend monitoring intake and output for 24 hours.   -Decrease Lasix to 20 mg daily -Recheck intake and output.   -Monitor weight -Recommend compression stockings -If edema increases or shortness of breath can resume Lasix 40 mg daily. -Follow up in 2 weeks or sooner if symptoms worsen

## 2022-10-05 NOTE — Assessment & Plan Note (Signed)
Monitor renal functions Currently on Lasix 40 daily BMet today Follow up in 2 weeks

## 2022-10-06 ENCOUNTER — Encounter: Payer: Self-pay | Admitting: Family Medicine

## 2022-10-06 DIAGNOSIS — I050 Rheumatic mitral stenosis: Secondary | ICD-10-CM | POA: Insufficient documentation

## 2022-10-06 NOTE — Assessment & Plan Note (Signed)
Mild Mitral stenosis on previous ECHO.  Follows with Cardiology Recommend repeat ECHO every 2-3 years. Follow up with Cardiology after next ECHO per Cards note 11/26/21

## 2022-10-06 NOTE — Assessment & Plan Note (Signed)
On statin and tolerating well. No myalgias Continue Lipitor 10  mg daily Repeat fasting Lipids at annual visit

## 2022-10-08 NOTE — Telephone Encounter (Signed)
Tried patient at both numbers was unable to reach or LM.

## 2022-10-14 ENCOUNTER — Ambulatory Visit (INDEPENDENT_AMBULATORY_CARE_PROVIDER_SITE_OTHER): Payer: Medicare PPO | Admitting: Family Medicine

## 2022-10-14 ENCOUNTER — Encounter: Payer: Self-pay | Admitting: Family Medicine

## 2022-10-14 VITALS — BP 130/80 | HR 85 | Temp 98.1°F | Ht 59.0 in | Wt 132.4 lb

## 2022-10-14 DIAGNOSIS — R600 Localized edema: Secondary | ICD-10-CM | POA: Diagnosis not present

## 2022-10-14 NOTE — Telephone Encounter (Signed)
Pt was seen today 08/14/22.

## 2022-10-14 NOTE — Progress Notes (Signed)
    SUBJECTIVE:   CHIEF COMPLAINT / HPI: follow up leg swelling  Presents for follow up for lower extremity edema. Seen in clinic on 10/23 and was recommended to decrease Lasix and fluid intake to monitor for worsening lower extremity edema.  Had been taking Lasix 40 mg daily and intake was 54 oz to avoid dehydration. Trial of decreasing Lasix to 20 mg daily and monitoring intake and output did not show any worsening leg edema.  We then decided to stop Lasix and continue to monitor for worsening symptoms and given strict return precautions. Since then patient reports no lower extremity edema since discontinuation of Lasix. She has also implemented compression stockings daily along with elevation of lower extremities when sitting.  Exercise has maintained same. Denies any shortness of breath or chest pain. No increase in weight.  She is also followed by Cardiology for Mitral stenosis and an ECHO is scheduled for 2024.  PERTINENT  PMH / PSH:  Chronic Venous Insufficiency HTN  OBJECTIVE:   BP 130/80 (BP Location: Left Arm, Patient Position: Sitting, Cuff Size: Normal)   Pulse 85   Temp 98.1 F (36.7 C) (Oral)   Ht '4\' 11"'$  (1.499 m)   Wt 132 lb 6.4 oz (60.1 kg)   SpO2 96%   BMI 26.74 kg/m    General: Alert, no acute distress Cardio: Normal S1 and S2, RRR, no r/m/g, no JVD appreciated Pulm: CTAB, normal work of breathing.  Extremities: Trace peripheral edema.   ASSESSMENT/PLAN:   Leg edema Chronic.  Likely secondary to venous insufficiency Stop Lasix Continue compression stockings Limit salt intake Limit intake to 1200 mls a day Continue your current exercise and physical therapy Continue to elevate legs when able Restart Lasix at 10 mg daily as needed, plan is to maintain discontinue  Follow up in 2 months or sooner if needed Strict return precautions provided    PDMP Reviewed  Carollee Leitz, MD

## 2022-10-14 NOTE — Patient Instructions (Addendum)
It was a pleasure meeting you today. Thank you for allowing me to take part in your health care.  Our goals for today as we discussed include:  For your leg swelling Stop Lasix Continue compression stockings Limit salt intake Limit intake to 1200 mls a day Continue your current exercise and physical therapy Continue to elevate legs when able If any worsening leg swelling can restart Lasix at 10 mg daily as needed If any shortness of breath or chest pain call 911 and have someone take you to the Emergency   Please follow-up with PCP in 2 months  If you have any questions or concerns, please do not hesitate to call the office at [phone].  I look forward to our next visit and until then take care and stay safe.  Regards,   Carollee Leitz, MD   [place]   Chronic Venous Insufficiency Chronic venous insufficiency is a condition where the leg veins cannot effectively pump blood from the legs to the heart. This happens when the vein walls are either stretched, weakened, or damaged, or when the valves inside the vein are damaged. With the right treatment, you should be able to continue with an active life. This condition is also called venous stasis. What are the causes? Common causes of this condition include: High blood pressure inside the veins (venous hypertension). Sitting or standing too long, causing increased blood pressure in the leg veins. A blood clot that blocks blood flow in a vein (deep vein thrombosis, DVT). Inflammation of a vein (phlebitis) that causes a blood clot to form. Tumors in the pelvis that cause blood to back up. What increases the risk? The following factors may make you more likely to develop this condition: Having a family history of this condition. Obesity. Pregnancy. Living without enough regular physical activity or exercise (sedentary lifestyle). Smoking. Having a job that requires long periods of standing or sitting in one  place. Being a certain age. Women in their 59s and 40s and men in their 15s are more likely to develop this condition. What are the signs or symptoms? Symptoms of this condition include: Veins that are enlarged, bulging, or twisted (varicose veins). Skin breakdown or ulcers. Reddened skin or dark discoloration of skin on the leg between the knee and ankle. Brown, smooth, tight, and painful skin just above the ankle, usually on the inside of the leg (lipodermatosclerosis). Swelling of the legs. How is this diagnosed? This condition may be diagnosed based on: Your medical history. A physical exam. Tests, such as: A procedure that creates an image of a blood vessel and nearby organs and provides information about blood flow through the blood vessel (duplex ultrasound). A procedure that tests blood flow (plethysmography). A procedure that looks at the veins using X-ray and dye (venogram). How is this treated? The goals of treatment are to help you return to an active life and to minimize pain or disability. Treatment depends on the severity of your condition, and it may include: Wearing compression stockings. These can help relieve symptoms and help prevent your condition from getting worse. However, they do not cure the condition. Sclerotherapy. This procedure involves an injection of a solution that shrinks damaged veins. Surgery. This may involve: Removing a diseased vein (vein stripping). Cutting off blood flow through the vein (laser ablation surgery). Repairing or reconstructing a valve within the affected vein. Follow these instructions at home:     Wear compression stockings as told by your health care provider.  These stockings help to prevent blood clots and reduce swelling in your legs. Take over-the-counter and prescription medicines only as told by your health care provider. Stay active by exercising, walking, or doing different activities. Ask your health care provider what  activities are safe for you and how much exercise you need. Drink enough fluid to keep your urine pale yellow. Do not use any products that contain nicotine or tobacco, such as cigarettes, e-cigarettes, and chewing tobacco. If you need help quitting, ask your health care provider. Keep all follow-up visits as told by your health care provider. This is important. Contact a health care provider if you: Have redness, swelling, or more pain in the affected area. See a red streak or line that goes up or down from the affected area. Have skin breakdown or skin loss in the affected area, even if the breakdown is small. Get an injury in the affected area. Get help right away if: You get an injury and an open wound in the affected area. You have: Severe pain that does not get better with medicine. Sudden numbness or weakness in the foot or ankle below the affected area. Trouble moving your foot or ankle. A fever. Worse or persistent symptoms. Chest pain. Shortness of breath. Summary Chronic venous insufficiency is a condition where the leg veins cannot effectively pump blood from the legs to the heart. Chronic venous insufficiency occurs when the vein walls become stretched, weakened, or damaged, or when valves within the vein are damaged. Treatment depends on how severe your condition is. It often involves wearing compression stockings and may involve having a procedure. Make sure you stay active by exercising, walking, or doing different activities. Ask your health care provider what activities are safe for you and how much exercise you need. This information is not intended to replace advice given to you by your health care provider. Make sure you discuss any questions you have with your health care provider. Document Revised: 02/06/2021 Document Reviewed: 02/06/2021 Elsevier Patient Education  [employer].

## 2022-10-16 ENCOUNTER — Encounter: Payer: Self-pay | Admitting: Family Medicine

## 2022-10-17 ENCOUNTER — Encounter: Payer: Self-pay | Admitting: Family Medicine

## 2022-10-17 ENCOUNTER — Encounter: Payer: Self-pay | Admitting: Cardiology

## 2022-10-26 ENCOUNTER — Encounter: Payer: Self-pay | Admitting: Family Medicine

## 2022-10-26 NOTE — Assessment & Plan Note (Addendum)
Chronic.  Likely secondary to venous insufficiency Stop Lasix Continue compression stockings Limit salt intake Limit intake to 1200 mls a day Continue your current exercise and physical therapy Continue to elevate legs when able Restart Lasix at 10 mg daily as needed, plan is to maintain discontinue  Follow up in 2 months or sooner if needed Strict return precautions provided

## 2022-12-05 ENCOUNTER — Encounter: Payer: Self-pay | Admitting: Family Medicine

## 2022-12-05 NOTE — Telephone Encounter (Signed)
Noted  

## 2022-12-18 ENCOUNTER — Encounter: Payer: Self-pay | Admitting: Family Medicine

## 2022-12-18 ENCOUNTER — Ambulatory Visit (INDEPENDENT_AMBULATORY_CARE_PROVIDER_SITE_OTHER): Payer: Medicare PPO | Admitting: Family Medicine

## 2022-12-18 ENCOUNTER — Telehealth: Payer: Self-pay | Admitting: Family Medicine

## 2022-12-18 VITALS — BP 128/82 | HR 87 | Temp 98.4°F | Ht 59.0 in | Wt 135.6 lb

## 2022-12-18 DIAGNOSIS — E785 Hyperlipidemia, unspecified: Secondary | ICD-10-CM

## 2022-12-18 DIAGNOSIS — R7303 Prediabetes: Secondary | ICD-10-CM

## 2022-12-18 DIAGNOSIS — C9000 Multiple myeloma not having achieved remission: Secondary | ICD-10-CM

## 2022-12-18 DIAGNOSIS — I872 Venous insufficiency (chronic) (peripheral): Secondary | ICD-10-CM | POA: Insufficient documentation

## 2022-12-18 DIAGNOSIS — Z136 Encounter for screening for cardiovascular disorders: Secondary | ICD-10-CM | POA: Insufficient documentation

## 2022-12-18 NOTE — Assessment & Plan Note (Signed)
Chronic.  Stable.  On statin and tolerating well. No myalgias Continue Lipitor 10  mg daily Refer for cardiac calcium score

## 2022-12-18 NOTE — Assessment & Plan Note (Signed)
Chronic.  Improved. Discontinue Lasix Elevate legs Monitor fluid and sodium intake Compression stockings daily Strict return precautions provided.

## 2022-12-18 NOTE — Patient Instructions (Signed)
It was a pleasure meeting you today. Thank you for allowing me to take part in your health care.  Our goals for today as we discussed include:  Doing well off Lasix. Continue elevating legs and using compression stockings as needed. Continue to monitor fluid intake Continue to limit salt intake Continue activity and limiting sitting position  Referral sent for calcium score of your heart.  It is a $99 self pay CT imaging exam.  They will call you to schedule an appointment.   If you have any questions or concerns, please do not hesitate to call the office at [phone].  I look forward to our next visit and until then take care and stay safe.  Regards,   Carollee Leitz, MD   [facility]

## 2022-12-18 NOTE — Progress Notes (Signed)
   SUBJECTIVE:   Chief Complaint  Patient presents with   Edema   HPI Presents for follow up for bilateral lower extremity swelling. Seen in clinic on 11/6 and edema had improved since decreasing Lasix, monitoring fluid and sodium intake and use compression stockings.  Since then patient reports improvement in symptoms.  Had 1 or 2 episodes of awakening in the morning with small amount of lower extremity edema bilaterally but resolved after movement.  Denies any chest pain, shortness of breath, calf pain or increase in weight.  Hyperlipidemia Currently on 10 mg Lipitor without myalgias.    Hypertension Symptomatic.  Well-controlled with diet and lifestyle modifications.  Blood pressures at home 130s/70s  PERTINENT PMH / PSH: Chronic venous insufficiency Hypertension Hyperlipidemia Prediabetes   OBJECTIVE:  BP 128/82   Pulse 87   Temp 98.4 F (36.9 C)   Ht '4\' 11"'$  (1.499 m)   Wt 135 lb 9.6 oz (61.5 kg)   SpO2 98%   BMI 27.39 kg/m    Physical Exam Vitals reviewed.  Constitutional:      General: She is not in acute distress.    Appearance: Normal appearance. She is normal weight. She is not ill-appearing, toxic-appearing or diaphoretic.  Eyes:     General:        Right eye: No discharge.        Left eye: No discharge.     Conjunctiva/sclera: Conjunctivae normal.  Cardiovascular:     Rate and Rhythm: Normal rate and regular rhythm.     Heart sounds: Normal heart sounds.  Pulmonary:     Effort: Pulmonary effort is normal.     Breath sounds: Normal breath sounds.  Abdominal:     Palpations: Abdomen is soft.  Musculoskeletal:        General: Normal range of motion.     Right lower leg: No edema.     Left lower leg: No edema.  Skin:    General: Skin is warm and dry.  Neurological:     General: No focal deficit present.     Mental Status: She is alert and oriented to person, place, and time. Mental status is at baseline.  Psychiatric:        Mood and Affect: Mood  normal.        Behavior: Behavior normal.        Thought Content: Thought content normal.        Judgment: Judgment normal.     ASSESSMENT/PLAN:  Chronic venous insufficiency of lower extremity Assessment & Plan: Chronic.  Improved. Discontinue Lasix Elevate legs Monitor fluid and sodium intake Compression stockings daily Strict return precautions provided.   Multiple myeloma, remission status unspecified (HCC) -     Comprehensive metabolic panel; Future -     CBC with Differential/Platelet; Future  Hyperlipidemia, unspecified hyperlipidemia type Assessment & Plan: Chronic.  Stable.  On statin and tolerating well. No myalgias Continue Lipitor 10  mg daily Refer for cardiac calcium score   Orders: -     Lipid panel; Future  Prediabetes -     Hemoglobin A1c; Future  Screening for ischemic heart disease -     CT CARDIAC SCORING (SELF PAY ONLY); Future   PDMP reviewed  Return in about 6 months (around 06/18/2023) for PCP, annual visit with fasting labs 1 week prior.  Carollee Leitz, MD

## 2022-12-18 NOTE — Telephone Encounter (Signed)
Lft pt vm to call ofc regarding the CT . thanks

## 2022-12-27 ENCOUNTER — Telehealth: Payer: Self-pay | Admitting: *Deleted

## 2022-12-27 NOTE — Telephone Encounter (Addendum)
Sent [employer] rep a message to receive benefit details. Pt is scheduled for Prolia on Monday 12/30/22. Prior auth expired on 12/08/22.  Will sent info to Prior Auth team in the meantime.

## 2022-12-30 ENCOUNTER — Ambulatory Visit (INDEPENDENT_AMBULATORY_CARE_PROVIDER_SITE_OTHER): Payer: Medicare PPO

## 2022-12-30 ENCOUNTER — Other Ambulatory Visit (HOSPITAL_COMMUNITY): Payer: Self-pay

## 2022-12-30 DIAGNOSIS — M810 Age-related osteoporosis without current pathological fracture: Secondary | ICD-10-CM

## 2022-12-30 MED ORDER — DENOSUMAB 60 MG/ML ~~LOC~~ SOSY
60.0000 mg | PREFILLED_SYRINGE | Freq: Once | SUBCUTANEOUS | Status: AC
  Administered 2022-12-30: 60 mg via SUBCUTANEOUS

## 2022-12-30 NOTE — Telephone Encounter (Signed)
Pt ready for scheduling on or after 12/30/22  Out-of-pocket cost due at time of visit: ($61.20)  Primary: [employer] Prolia co-insurance: 4% Admin fee co-insurance:   Secondary:  Prolia co-insurance:  Admin fee co-insurance:   Deductible: $192 remaining ($120.19 met)  Prior Auth: submitted 12/30/22 PA# Key: [id number] Valid:     ** This summary of benefits is an estimation of the patient's out-of-pocket cost. Exact cost may very based on individual plan coverage.

## 2022-12-30 NOTE — Telephone Encounter (Signed)
Last read by [name] at  8:35 AM on 12/30/2022.

## 2022-12-30 NOTE — Progress Notes (Signed)
Pt presented for their subcutaneous Prolia injection. Pt was identified through two identifiers. Pt was given the information packets about the Prolia and told to schedule their next injection 6 months out. Pt tolerated the subq injection well in the left arm.  

## 2022-12-31 ENCOUNTER — Other Ambulatory Visit: Payer: Medicare PPO

## 2022-12-31 NOTE — Telephone Encounter (Signed)
Last Prolia inj: 12/30/22 Next Prolia inj DUE: 06/29/23

## 2022-12-31 NOTE — Telephone Encounter (Signed)
Patient Advocate Encounter  Prior Authorization for Prolia '60mg'$  has been approved.    PA# [id number] Effective dates: 06/08/22 through 12/09/23

## 2023-01-02 ENCOUNTER — Ambulatory Visit
Admission: RE | Admit: 2023-01-02 | Discharge: 2023-01-02 | Disposition: A | Discharge status: Home / Self Care | Payer: Medicare PPO | Source: Ambulatory Visit | Attending: Family Medicine | Admitting: Family Medicine

## 2023-01-02 DIAGNOSIS — Z136 Encounter for screening for cardiovascular disorders: Secondary | ICD-10-CM | POA: Insufficient documentation

## 2023-01-03 NOTE — Telephone Encounter (Signed)
BV came back as mentioned below. Pt received Prolia on 12/30/22.

## 2023-01-14 ENCOUNTER — Encounter: Payer: Self-pay | Admitting: Family Medicine

## 2023-01-14 ENCOUNTER — Ambulatory Visit (INDEPENDENT_AMBULATORY_CARE_PROVIDER_SITE_OTHER): Payer: Medicare PPO | Admitting: Family Medicine

## 2023-01-14 VITALS — BP 118/68 | HR 84 | Temp 97.4°F | Ht 59.0 in | Wt 136.8 lb

## 2023-01-14 DIAGNOSIS — R931 Abnormal findings on diagnostic imaging of heart and coronary circulation: Secondary | ICD-10-CM | POA: Diagnosis not present

## 2023-01-14 DIAGNOSIS — Z136 Encounter for screening for cardiovascular disorders: Secondary | ICD-10-CM

## 2023-01-14 DIAGNOSIS — E785 Hyperlipidemia, unspecified: Secondary | ICD-10-CM

## 2023-01-14 NOTE — Patient Instructions (Signed)
It was a pleasure meeting you today. Thank you for allowing me to take part in your health care.  Our goals for today as we discussed include:  Continue Lipitor 10 mg daily    If you have any questions or concerns, please do not hesitate to call the office at [phone].  I look forward to our next visit and until then take care and stay safe.  Regards,   Carollee Leitz, MD   [facility]

## 2023-01-14 NOTE — Progress Notes (Signed)
   SUBJECTIVE:   Chief Complaint  Patient presents with   Acute Visit    Review CT cardiac calcium score   HPI Patient presents to clinic to discuss results of CT cardiac calcium score.  Reviewed recent results showing from 01/02/23.  CAC 1-99 in LAD.  Indicating 42nd percentile.  Discussed with patient that given she is currently on statin therapy and while this is low risk score likely if discontinue therapy score potentially may have increased.  Collaborative decision made to resume current treatment.  PERTINENT PMH / PSH: Mitral stenosis Hyperlipidemia   OBJECTIVE:  BP 118/68   Pulse 84   Temp (!) 97.4 F (36.3 C)   Ht '4\' 11"'$  (1.499 m)   Wt 136 lb 12.8 oz (62.1 kg)   SpO2 98%   BMI 27.63 kg/m    Physical Exam Vitals reviewed.  Constitutional:      General: She is not in acute distress.    Appearance: Normal appearance. She is normal weight. She is not ill-appearing, toxic-appearing or diaphoretic.  Eyes:     General:        Right eye: No discharge.        Left eye: No discharge.     Conjunctiva/sclera: Conjunctivae normal.  Cardiovascular:     Rate and Rhythm: Normal rate and regular rhythm.     Heart sounds: Normal heart sounds.  Pulmonary:     Effort: Pulmonary effort is normal.     Breath sounds: Normal breath sounds.  Musculoskeletal:     Right lower leg: No edema.     Left lower leg: No edema.  Skin:    General: Skin is warm and dry.  Neurological:     Mental Status: She is alert and oriented to person, place, and time. Mental status is at baseline.  Psychiatric:        Mood and Affect: Mood normal.        Behavior: Behavior normal.        Thought Content: Thought content normal.        Judgment: Judgment normal.     ASSESSMENT/PLAN:  Agatston coronary artery calcium score less than 100 Assessment & Plan: Arterial calcification distribution-LAD 43.4 Recommendation healthy lifestyle and risk factor modification Given patient was previously on  statin therapy collaborative decision to remain on statin therapy Refill Lipitor 10 mg daily   Hyperlipidemia, unspecified hyperlipidemia type Assessment & Plan: Chronic.  Stable.    No myalgias with current statin therapy Continue Lipitor     Orders: -     Atorvastatin Calcium; Take 1 tablet (10 mg total) by mouth daily at 6 PM.  Dispense: 90 tablet; Refill: 3   PDMP reviewed  Return if symptoms worsen or fail to improve.  Carollee Leitz, MD

## 2023-01-17 ENCOUNTER — Encounter: Payer: Self-pay | Admitting: Family Medicine

## 2023-01-17 DIAGNOSIS — R931 Abnormal findings on diagnostic imaging of heart and coronary circulation: Secondary | ICD-10-CM | POA: Insufficient documentation

## 2023-01-17 MED ORDER — ATORVASTATIN CALCIUM 10 MG PO TABS: 10.0000 mg | ORAL_TABLET | Freq: Every day | ORAL | 3 refills | Status: DC

## 2023-01-17 NOTE — Assessment & Plan Note (Addendum)
Arterial calcification distribution-LAD 43.4 Recommendation healthy lifestyle and risk factor modification Given patient was previously on statin therapy collaborative decision to remain on statin therapy Refill Lipitor 10 mg daily

## 2023-01-17 NOTE — Assessment & Plan Note (Addendum)
Chronic.  Stable.    No myalgias with current statin therapy Continue Lipitor

## 2023-01-20 ENCOUNTER — Encounter: Payer: Self-pay | Admitting: Family Medicine

## 2023-01-23 ENCOUNTER — Ambulatory Visit (INDEPENDENT_AMBULATORY_CARE_PROVIDER_SITE_OTHER): Payer: Medicare PPO | Admitting: *Deleted

## 2023-01-23 ENCOUNTER — Encounter: Payer: Self-pay | Admitting: *Deleted

## 2023-01-23 VITALS — Ht 59.0 in | Wt 136.8 lb

## 2023-01-23 DIAGNOSIS — Z0000 Encounter for general adult medical examination without abnormal findings: Secondary | ICD-10-CM

## 2023-01-23 NOTE — Patient Instructions (Signed)
  Ms. Carol Stone , Thank you for taking time to come for your Medicare Wellness Visit. I appreciate your ongoing commitment to your health goals. Please review the following plan we discussed and let me know if I can assist you in the future.   These are the goals we discussed:  Goals       Follow up with Primary Care Provider (pt-stated)      As needed.         This is a list of the screening recommended for you and due dates:  Health Maintenance  Topic Date Due   COVID-19 Vaccine (9 - 2023-24 season) 01/30/2023*   Mammogram  10/01/2023   Medicare Annual Wellness Visit  01/24/2024   DTaP/Tdap/Td vaccine (4 - Td or Tdap) 08/16/2026   Flu Shot  Completed   DEXA scan (bone density measurement)  Completed   Hepatitis C Screening: USPSTF Recommendation to screen - Ages 28-79 yo.  Completed   Zoster (Shingles) Vaccine  Completed   HPV Vaccine  Aged Out   Pneumonia Vaccine  Discontinued   Colon Cancer Screening  Discontinued  *Topic was postponed. The date shown is not the original due date.

## 2023-01-23 NOTE — Progress Notes (Signed)
I connected with  Carol Stone on 01/23/23 by a audio enabled telemedicine application and verified that I am speaking with the correct person using two identifiers.  Patient Location: Home  Provider Location: Office/Clinic  I discussed the limitations of evaluation and management by telemedicine. The patient expressed understanding and agreed to proceed.  Subjective:   Carol Stone is a 77 y.o. female who presents for Medicare Annual (Subsequent) preventive examination.     Objective:    There were no vitals filed for this visit. There is no height or weight on file to calculate BMI.     01/23/2023    2:14 PM 01/03/2022    9:11 AM 01/02/2021    9:31 AM 07/09/2019   11:04 AM 06/14/2019   10:22 AM 03/03/2016    5:38 PM 03/01/2016    7:00 AM  Advanced Directives  Does Patient Have a Medical Advance Directive? Yes Yes Yes Yes Yes Yes Yes  Type of Paramedic of [place];Living will [place];Living will  [place];Living will [place];Living will [place];Living will [place];Living will  Does patient want to make changes to medical advance directive?  No - Patient declined No - Patient declined No - Patient declined No - Patient declined  No - Patient declined  Copy of [place] in Chart? No - copy requested No - copy requested  No - copy requested  Yes No - copy requested    Current Medications (verified) Outpatient Encounter Medications as of 01/23/2023  Medication Sig   atorvastatin (LIPITOR) 10 MG tablet Take 1 tablet (10 mg total) by mouth daily at 6 PM.   calcium-vitamin D (OSCAL WITH D) 500-200 MG-UNIT tablet Take 1 tablet by mouth.   diclofenac sodium (VOLTAREN) 1 % GEL Apply topically 4 (four) times daily. Prn   famotidine (PEPCID) 20 MG tablet Take 20 mg by mouth 2 (two) times daily.   Multiple Vitamin (MULTIVITAMIN) tablet Take 1  tablet by mouth daily.   RESTASIS 0.05 % ophthalmic emulsion PLACE 1 DROP INTO BOTH EYES TWICE A DAY   No facility-administered encounter medications on file as of 01/23/2023.    Allergies (verified) Bactrim [sulfamethoxazole-trimethoprim], Codeine, Compazine [prochlorperazine edisylate], Demerol [meperidine], Indocin [indomethacin], Other, and Reglan [metoclopramide]   History: Past Medical History:  Diagnosis Date   Allergy    Arthritis    Arthritis of lumbar spine 04/02/2021   Arthritis of neck 04/02/2021   Back spasm 08/22/2021   Capillaritis 03/23/2019   Cataract    Cervicalgia 12/26/2021   Decreased GFR 07/09/2021   06/26/21 GFR 48     Early satiety 01/17/2021   Elevated blood pressure reading 11/23/2018   Fracture Risk Assessment Score (FRAX) indicating greater than 3% risk for hip fracture 05/24/2022   Frequent headaches    seasonal   GERD (gastroesophageal reflux disease)    History of chicken pox    History of colon polyps    History of osteoporosis 05/24/2022   Hypercalcemia 07/09/2021   Hypercholesteremia    Hypoglycemia 01/17/2021   IBS (irritable bowel syndrome)    constipation    Immunization reaction 01/13/2020   Impingement syndrome of left shoulder 06/25/2020   Dr. Harlow Mares see 06/21/20 [facility]    Insomnia 11/23/2018   Leg edema 01/13/2020   Lymphedema 01/17/2021   Neuropathy 08/22/2021   Numbness of toes 01/17/2021   OSA on CPAP    Osteopenia 05/24/2022  Osteoporosis    Sinus tachycardia 07/13/2020   Sleep apnea    Subconjunctival hemorrhage of left eye 01/17/2021   Late 12/2020 resolved after 2 weeks    Thoracic spondylosis without myelopathy 10/18/2021   Tinnitus    Venous insufficiency    Past Surgical History:  Procedure Laterality Date   ABDOMINAL HYSTERECTOMY     carpel tunnel surgery-left Left    July 2023   COLONOSCOPY WITH PROPOFOL N/A 03/14/2016   Procedure: COLONOSCOPY WITH PROPOFOL;  Surgeon: Hulen Luster, MD;  Location: [facility]  ENDOSCOPY;  Service: Gastroenterology;  Laterality: N/A;   DILATION AND CURETTAGE OF UTERUS     TONSILLECTOMY     Family History  Problem Relation Age of Onset   Prostate cancer Father    Cancer Father        prostate    Hypertension Father    Lung cancer Sister    Cancer Sister        lung smoker    Deep vein thrombosis Sister    Pulmonary embolism Sister    Breast cancer Maternal Aunt 69   Cancer Maternal Aunt        breast   Stroke Mother    Osteoporosis Mother    Heart disease Maternal Grandmother        CHF   AAA (abdominal aortic aneurysm) Paternal Grandmother    Hypertension Paternal Grandmother    Cancer Paternal Grandfather        prostate   Social History   Socioeconomic History   Marital status: Married    Spouse name: Not on file   Number of children: Not on file   Years of education: Not on file   Highest education level: Not on file  Occupational History   Not on file  Tobacco Use   Smoking status: Former   Smokeless tobacco: Never   Tobacco comments:    age [age] light   Vaping Use   Vaping Use: Never used  Substance and Sexual Activity   Alcohol use: No   Drug use: No   Sexual activity: Not Currently    Birth control/protection: Surgical  Other Topics Concern   Not on file  Social History Narrative   Married, husband in house    Wears seat belt, husband owns guns, safe in relationship    No kids    [employer], previous nurse office work    No kids    Retired    Scientist, physiological Strain: [place]  (01/23/2023)   Overall Financial Resource Strain (CARDIA)    Difficulty of Paying Living Expenses: Not hard at all  Food Insecurity: No [place] (01/23/2023)   Hunger Vital Sign    Worried About Running Out of Food in the Last Year: Never true    [place] in the Last Year: Never true  Transportation Needs: No Transportation Needs (01/23/2023)   PRAPARE - Hydrologist  (Medical): No    Lack of Transportation (Non-Medical): No  Physical Activity: Sufficiently Active (01/23/2023)   Exercise Vital Sign    Days of Exercise per Week: 7 days    Minutes of Exercise per Session: 60 min  Stress: No Stress Concern Present (01/23/2023)   [place]    Feeling of Stress : Not at all  Social Connections: Unknown (01/23/2023)   Social Connection and Isolation Panel [NHANES]    Frequency  of Communication with Friends and Family: More than three times a week    Frequency of Social Gatherings with Friends and Family: More than three times a week    Attends Religious Services: Not on Advertising copywriter or Organizations: Yes    Attends Music therapist: More than 4 times per year    Marital Status: Married    Tobacco Counseling Counseling given: Not Answered Tobacco comments: age [age] light    Clinical Intake:  Pre-visit preparation completed: Yes  Pain : No/denies pain     BMI - recorded: 27.62 Nutritional Status: BMI 25 -29 Overweight Diabetes: No  How often do you need to have someone help you when you read instructions, pamphlets, or other written materials from your doctor or pharmacy?: 1 - Never  Diabetic? No  Interpreter Needed?: No  Information entered by :: Cannon Kettle, CMA   Activities of Daily Living    01/23/2023    1:29 PM 01/19/2023    3:27 PM  In your present state of health, do you have any difficulty performing the following activities:  Hearing? 0 0  Vision? 0 0  Difficulty concentrating or making decisions? 0 0  Walking or climbing stairs? 0 0  Dressing or bathing? 0 0  Doing errands, shopping? 0 0  Preparing Food and eating ? N N  Using the Toilet? N N  In the past six months, have you accidently leaked urine? N N  Do you have problems with loss of bowel control? N N  Managing your Medications? N N  Managing your Finances? N N   Housekeeping or managing your Housekeeping? N N    Patient Care Team: Carollee Leitz, MD as PCP - General (Family Medicine) Kate Sable, MD as PCP - Cardiology (Cardiology)  Indicate any recent Medical Services you may have received from other than [facility] providers in the past year (date may be approximate).     Assessment:   This is a routine wellness examination for [place].  Hearing/Vision screen No results found.  Dietary issues and exercise activities discussed:     Goals Addressed   None    Depression Screen    01/23/2023    2:13 PM 01/14/2023    2:34 PM 12/18/2022    8:15 AM 10/14/2022   11:27 AM 09/30/2022    1:25 PM 05/24/2022    8:05 AM 01/03/2022    9:06 AM  PHQ 2/9 Scores  PHQ - 2 Score 0 0 0 0 0 0 0  PHQ- 9 Score 0 0 0        Fall Risk    01/23/2023    2:12 PM 01/23/2023    1:29 PM 01/19/2023    3:27 PM 01/14/2023    2:33 PM 12/18/2022    8:14 AM  Fall Risk   Falls in the past year? 1 1 1 1 1  $ Number falls in past yr: 0 0 0 0 0  Injury with Fall? 1 1 1 1 1  $ Risk for fall due to : No Fall Risks   History of fall(s) History of fall(s)  Follow up Falls evaluation completed   Falls evaluation completed Falls evaluation completed    [place]:  Any stairs in or around the home? Yes  If so, are there any without handrails? No  Home free of loose throw rugs in walkways, pet beds, electrical cords, etc? Yes  Adequate lighting in your  home to reduce risk of falls? Yes   ASSISTIVE DEVICES UTILIZED TO PREVENT FALLS:  Life alert? No  Use of a cane, walker or w/c? No  Grab bars in the bathroom? Yes  Shower chair or bench in shower? No  Elevated toilet seat or a handicapped toilet? No    Cognitive Function:        01/23/2023    2:16 PM 07/09/2019   11:09 AM  6CIT Screen  What Year? 0 points 0 points  What month? 0 points 0 points  What time? 0 points 0 points  Count back from 20 0 points 0 points  Months in reverse  0 points 0 points  Repeat phrase 4 points 0 points  Total Score 4 points 0 points    Immunizations Immunization History  Administered Date(s) Administered   Fluad Quad(high Dose 65+) 08/22/2021   Influenza, High Dose Seasonal PF 08/22/2021   Influenza-Unspecified 09/29/2014, 10/04/2015, 10/04/2016, 10/02/2017, 10/08/2018, 09/16/2019, 08/16/2020, 09/24/2022   Moderna Sars-Covid-2 Vaccination 12/21/2019, 01/21/2020, 10/24/2020   PFIZER Comirnaty(Gray Top)Covid-19 Tri-Sucrose Vaccine 12/21/2019, 01/21/2020, 10/24/2020   PNEUMOCOCCAL CONJUGATE-20 09/30/2022   Pfizer Covid-19 Vaccine Bivalent Booster 21yr & up 08/30/2021   Pneumococcal Conjugate-13 06/30/2014, 09/13/2016   Pneumococcal-Unspecified 08/25/2011   Rsv, Bivalent, Protein Subunit Rsvpref,pf ([place] 10/30/2022   Td 01/17/1999   Td (Adult),unspecified 01/17/1999   Tdap 08/16/2016   Unspecified SARS-COV-2 Vaccination 09/05/2022   Zoster Recombinat (Shingrix) 08/25/2018, 10/25/2018, 10/25/2018   Zoster, Live 09/26/2006   Zoster, Unspecified 08/25/2018, 10/25/2018    TDAP status: Up to date  Flu Vaccine status: Up to date  Pneumococcal vaccine status: Up to date  Covid-19 vaccine status: Completed vaccines  Qualifies for Shingles Vaccine? Yes   Zostavax completed Yes   Shingrix Completed?: Yes  Screening Tests Health Maintenance  Topic Date Due   COVID-19 Vaccine (9 - 2023-24 season) 01/30/2023 (Originally 10/31/2022)   MAMMOGRAM  10/01/2023   Medicare Annual Wellness (AWV)  01/24/2024   DTaP/Tdap/Td (4 - Td or Tdap) 08/16/2026   INFLUENZA VACCINE  Completed   DEXA SCAN  Completed   Hepatitis C Screening  Completed   Zoster Vaccines- Shingrix  Completed   HPV VACCINES  Aged Out   Pneumonia Vaccine 78 Years old  Discontinued   COLONOSCOPY (Pts 45-419yrInsurance coverage will need to be confirmed)  Discontinued    Health Maintenance  There are no preventive care reminders to display for this  patient.   Colorectal cancer screening: Type of screening: Colonoscopy. Completed 02/27/2021. Repeat every 5 years  Mammogram status: Completed 09/30/22. Repeat every year  Bone Density status: Completed 01/16/22. Results reflect: Bone density results: OSTEOPENIA. Repeat every 2 years.  Lung Cancer Screening: (Low Dose CT Chest recommended if Age 78-80ears, 30 pack-year currently smoking OR have quit w/in 15years.) does not qualify.    Additional Screening:  Hepatitis C Screening: does qualify; Completed 02/2019  Vision Screening: Recommended annual ophthalmology exams for early detection of glaucoma and other disorders of the eye. Is the patient up to date with their annual eye exam?  Yes  Who is the provider or what is the name of the office in which the patient attends annual eye exams? [facility] pt is not established with a provider, would they like to be referred to a provider to establish care? No .   Dental Screening: Recommended annual dental exams for proper oral hygiene  Community Resource Referral / Chronic Care Management: CRR required this visit?  No  CCM required this visit?  No      Plan:     I have personally reviewed and noted the following in the patient's chart:   Medical and social history Use of alcohol, tobacco or illicit drugs  Current medications and supplements including opioid prescriptions. Patient is currently taking opioid prescriptions. Information provided to patient regarding non-opioid alternatives. Patient advised to discuss non-opioid treatment plan with their provider. Functional ability and status Nutritional status Physical activity Advanced directives List of other physicians Hospitalizations, surgeries, and ER visits in previous 12 months Vitals Screenings to include cognitive, depression, and falls Referrals and appointments  In addition, I have reviewed and discussed with patient certain preventive  protocols, quality metrics, and best practice recommendations. A written personalized care plan for preventive services as well as general preventive health recommendations were provided to patient.     Cannon Kettle, [place]   01/23/2023   Nurse Notes: I reviewed questionnaire received via mychart during visit. Total time spent on telephone with patient was 20 minutes.

## 2023-05-03 ENCOUNTER — Encounter: Payer: Self-pay | Admitting: Cardiology

## 2023-06-02 DIAGNOSIS — D472 Monoclonal gammopathy: Secondary | ICD-10-CM | POA: Diagnosis not present

## 2023-06-02 DIAGNOSIS — E8809 Other disorders of plasma-protein metabolism, not elsewhere classified: Secondary | ICD-10-CM | POA: Diagnosis not present

## 2023-06-03 ENCOUNTER — Encounter: Payer: Self-pay | Admitting: Family Medicine

## 2023-06-04 ENCOUNTER — Other Ambulatory Visit (HOSPITAL_COMMUNITY): Payer: Self-pay

## 2023-06-04 ENCOUNTER — Telehealth: Payer: Self-pay

## 2023-06-04 NOTE — Telephone Encounter (Signed)
Pt ready for scheduling for PROLIA on or after : 06/30/23  Out-of-pocket cost due at time of visit: $0  Primary: [employer] Prolia co-insurance: 0% Admin fee co-insurance: 0%  Secondary: --- Prolia co-insurance:  Admin fee co-insurance:   Medical Benefit Details: Date Benefits were checked: 06/02/23 Deductible: NO/ Coinsurance: 0%/ Admin Fee: 0%  Prior Auth: APPROVED PA# [id number] Expiration Date: 06/08/22-12/09/23   Pharmacy benefit: Copay $60 If patient wants fill through the pharmacy benefit please send prescription to: [employer], and include estimated need by date in rx notes. Pharmacy will ship medication directly to the office.  Patient NOT eligible for Prolia Copay Card. Copay Card can make patient's cost as little as $25. Link to apply: [email or web address]://www.amgensupportplus.[email or web address]/copay  ** This summary of benefits is an estimation of the patient's out-of-pocket cost. Exact cost may very based on individual plan coverage.

## 2023-06-11 ENCOUNTER — Encounter: Payer: Self-pay | Admitting: *Deleted

## 2023-06-11 NOTE — Telephone Encounter (Signed)
Pt scheduled on 7/25 & mychart message sent

## 2023-06-13 ENCOUNTER — Other Ambulatory Visit (INDEPENDENT_AMBULATORY_CARE_PROVIDER_SITE_OTHER): Payer: Medicare PPO

## 2023-06-13 DIAGNOSIS — C9000 Multiple myeloma not having achieved remission: Secondary | ICD-10-CM | POA: Diagnosis not present

## 2023-06-13 DIAGNOSIS — E785 Hyperlipidemia, unspecified: Secondary | ICD-10-CM

## 2023-06-13 DIAGNOSIS — R7303 Prediabetes: Secondary | ICD-10-CM | POA: Diagnosis not present

## 2023-06-13 LAB — COMPREHENSIVE METABOLIC PANEL
ALT: 17 U/L (ref 0–35)
AST: 17 U/L (ref 0–37)
Albumin: 4.2 g/dL (ref 3.5–5.2)
Alkaline Phosphatase: 53 U/L (ref 39–117)
BUN: 20 mg/dL (ref 6–23)
CO2: 26 mEq/L (ref 19–32)
Calcium: 9.6 mg/dL (ref 8.4–10.5)
Chloride: 105 mEq/L (ref 96–112)
Creatinine, Ser: 0.98 mg/dL (ref 0.40–1.20)
GFR: 55.59 mL/min — ABNORMAL LOW (ref >60.00)
Glucose, Bld: 106 mg/dL — ABNORMAL HIGH (ref 70–99)
Potassium: 4.2 mEq/L (ref 3.5–5.1)
Sodium: 141 mEq/L (ref 135–145)
Total Bilirubin: 0.7 mg/dL (ref 0.2–1.2)
Total Protein: 7.1 g/dL (ref 6.0–8.3)

## 2023-06-13 LAB — CBC WITH DIFFERENTIAL/PLATELET
Basophils Absolute: 0.1 10*3/uL (ref 0.0–0.1)
Basophils Relative: 1 % (ref 0.0–3.0)
Eosinophils Absolute: 0.1 10*3/uL (ref 0.0–0.7)
Eosinophils Relative: 2.3 % (ref 0.0–5.0)
HCT: 40.2 % (ref 36.0–46.0)
Hemoglobin: 13.3 g/dL (ref 12.0–15.0)
Lymphocytes Relative: 37.5 % (ref 12.0–46.0)
Lymphs Abs: 2.4 10*3/uL (ref 0.7–4.0)
MCHC: 33 g/dL (ref 30.0–36.0)
MCV: 91.2 fl (ref 78.0–100.0)
Monocytes Absolute: 0.4 10*3/uL (ref 0.1–1.0)
Monocytes Relative: 6.8 % (ref 3.0–12.0)
Neutro Abs: 3.4 10*3/uL (ref 1.4–7.7)
Neutrophils Relative %: 52.4 % (ref 43.0–77.0)
Platelets: 264 10*3/uL (ref 150.0–400.0)
RBC: 4.41 Mil/uL (ref 3.87–5.11)
RDW: 13.9 % (ref 11.5–15.5)
WBC: 6.5 10*3/uL (ref 4.0–10.5)

## 2023-06-13 LAB — LIPID PANEL
Cholesterol: 197 mg/dL (ref 0–200)
HDL: 58.9 mg/dL (ref >39.00)
LDL Cholesterol: 110 mg/dL — ABNORMAL HIGH (ref 0–99)
NonHDL: 138.56
Total CHOL/HDL Ratio: 3
Triglycerides: 144 mg/dL (ref 0.0–149.0)
VLDL: 28.8 mg/dL (ref 0.0–40.0)

## 2023-06-13 LAB — HEMOGLOBIN A1C: Hgb A1c MFr Bld: 5.9 % (ref 4.6–6.5)

## 2023-06-19 ENCOUNTER — Encounter: Payer: Medicare PPO | Admitting: Family Medicine

## 2023-06-20 ENCOUNTER — Encounter: Payer: Self-pay | Admitting: Family Medicine

## 2023-07-03 ENCOUNTER — Ambulatory Visit (INDEPENDENT_AMBULATORY_CARE_PROVIDER_SITE_OTHER): Payer: Medicare PPO

## 2023-07-03 DIAGNOSIS — M810 Age-related osteoporosis without current pathological fracture: Secondary | ICD-10-CM | POA: Diagnosis not present

## 2023-07-03 MED ORDER — DENOSUMAB 60 MG/ML ~~LOC~~ SOSY
60.0000 mg | PREFILLED_SYRINGE | Freq: Once | SUBCUTANEOUS | Status: AC
Start: 2023-07-03 — End: 2023-07-03
  Administered 2023-07-03: 60 mg via SUBCUTANEOUS

## 2023-07-03 NOTE — Progress Notes (Signed)
Patient arrived for a Prolia injection and it was admininistered into her right arm SQ. Patient tolerated the injection well and did not show any signs of distress or voice any concerns.

## 2023-07-09 ENCOUNTER — Encounter (INDEPENDENT_AMBULATORY_CARE_PROVIDER_SITE_OTHER): Payer: Self-pay

## 2023-07-24 ENCOUNTER — Ambulatory Visit (INDEPENDENT_AMBULATORY_CARE_PROVIDER_SITE_OTHER): Payer: Medicare PPO | Admitting: Family Medicine

## 2023-07-24 ENCOUNTER — Encounter: Payer: Self-pay | Admitting: Family Medicine

## 2023-07-24 VITALS — BP 122/72 | HR 99 | Temp 98.0°F | Resp 16 | Ht 58.75 in | Wt 133.5 lb

## 2023-07-24 DIAGNOSIS — I050 Rheumatic mitral stenosis: Secondary | ICD-10-CM

## 2023-07-24 DIAGNOSIS — Z1231 Encounter for screening mammogram for malignant neoplasm of breast: Secondary | ICD-10-CM | POA: Diagnosis not present

## 2023-07-24 DIAGNOSIS — I872 Venous insufficiency (chronic) (peripheral): Secondary | ICD-10-CM | POA: Diagnosis not present

## 2023-07-24 DIAGNOSIS — Z0000 Encounter for general adult medical examination without abnormal findings: Secondary | ICD-10-CM

## 2023-07-24 NOTE — Patient Instructions (Addendum)
It was a pleasure meeting you today. Thank you for allowing me to take part in your health care.  Our goals for today as we discussed include:  Referral sent for Mammogram. Please call to schedule appointment. [facility] [address] [place], [place] [address] [phone]    Can restart Lasix 10 mg daily as needed for increase swelling or increase in weight. Not necessary to take daily if no swelling. Limit sodium intake Continue use of compression stockings Follow up with Cardiology as scheduled for ECHO  Will recheck kidney functions in 2 weeks if taking Lasix more than three times a week.  If any chest pain, worsening shortness of breath, heart palpitations go to Emergency   Follow up as needed  Annual visit in 1 year  This is a list of the screening recommended for you and due dates:  Health Maintenance  Topic Date Due   COVID-19 Vaccine (9 - 2023-24 season) 10/31/2022   Flu Shot  03/08/2024*   Mammogram  10/01/2023   Medicare Annual Wellness Visit  01/24/2024   DTaP/Tdap/Td vaccine (4 - Td or Tdap) 08/16/2026   DEXA scan (bone density measurement)  Completed   Hepatitis C Screening  Completed   Zoster (Shingles) Vaccine  Completed   HPV Vaccine  Aged Out   Pneumonia Vaccine  Discontinued   Colon Cancer Screening  Discontinued  *Topic was postponed. The date shown is not the original due date.     If you have any questions or concerns, please do not hesitate to call the office at [phone].  I look forward to our next visit and until then take care and stay safe.  Regards,   Dana Allan, MD   [facility]

## 2023-07-24 NOTE — Assessment & Plan Note (Addendum)
HCM PCV 20 vaccine completed Colonoscopy UTD, sessile polyp repeat in 5 years. Due 02/2026.  Follows with Dr Norma Fredrickson, [facility]  Mammogram due 10/24.  Referral placed today Recommend regular self breast exams  Dexa completed Shingles completed

## 2023-07-24 NOTE — Assessment & Plan Note (Signed)
Chronic. Restart Lasix 10 mg daily as needed  Elevate legs Monitor fluid and sodium intake Compression stockings daily Strict return precautions provided.

## 2023-07-24 NOTE — Assessment & Plan Note (Signed)
Mild SOB on exertion and increase in weight Restart Lasix 10 mg daily as needed Mild Mitral stenosis on previous ECHO.   Follow up with Cardiology after next ECHO per Cards note 11/26/21 Strict return precautions provided

## 2023-07-24 NOTE — Progress Notes (Signed)
SUBJECTIVE:   Chief Complaint  Patient presents with   Annual Exam   Hernia   HPI Presents for annual physical  Concern for some mild increase in swelling in right upper extremities and lower extremities.  Has had some increase in shortness of breath when walking but nothing to where having to stop to catch breath.  She reports an increase in weight since last year.  Appetite remains unchanged.  Hydrating well and watching fluid intake.  Had discontinued Lasix as not indicated and was doing well with fluid restriction.  Did not have any edema or SOB.  She is scheduled to see Cardiology in Dec for repeat ECHO.  Has plenty of Lasix at home.  Denies any chest pain, heart palpitations.  Denies SI/HI Denies any falls  Planning for 2 upcoming road trips.  Nutrition and activity good  Review of Systems - Negative except above listed HPI    PERTINENT PMH / PSH: OSA on CPAP Chronic venous insufficiency Mitral Stenosis HLD Multiple Myeloma in remission DDD  OBJECTIVE:  BP 122/72   Pulse 99   Temp 98 F (36.7 C)   Resp 16   Ht 4' 10.75" (1.492 m)   Wt 133 lb 8 oz (60.6 kg)   SpO2 97%   BMI 27.19 kg/m    Physical Exam Vitals reviewed.  Constitutional:      General: She is not in acute distress.    Appearance: Normal appearance. She is normal weight. She is not ill-appearing, toxic-appearing or diaphoretic.  HENT:     Right Ear: Tympanic membrane, ear canal and external ear normal.     Left Ear: Tympanic membrane, ear canal and external ear normal.  Eyes:     General:        Right eye: No discharge.        Left eye: No discharge.     Conjunctiva/sclera: Conjunctivae normal.  Neck:     Thyroid: No thyromegaly or thyroid tenderness.  Cardiovascular:     Rate and Rhythm: Normal rate and regular rhythm.     Heart sounds: Normal heart sounds.  Pulmonary:     Effort: Pulmonary effort is normal.     Breath sounds: Normal breath sounds.  Abdominal:     General: Bowel  sounds are normal.  Musculoskeletal:        General: Normal range of motion.     Right lower leg: Edema (trace) present.     Left lower leg: Edema (trace) present.  Skin:    General: Skin is warm and dry.     Capillary Refill: Capillary refill takes less than 2 seconds.  Neurological:     General: No focal deficit present.     Mental Status: She is alert and oriented to person, place, and time. Mental status is at baseline.  Psychiatric:        Mood and Affect: Mood normal.        Behavior: Behavior normal.        Thought Content: Thought content normal.        Judgment: Judgment normal.        07/24/2023    8:21 AM 01/23/2023    2:13 PM 01/14/2023    2:34 PM 12/18/2022    8:15 AM 10/14/2022   11:27 AM  Depression screen PHQ 2/9  Decreased Interest 0 0 0 0 0  Down, Depressed, Hopeless 0 0 0 0 0  PHQ - 2 Score 0 0 0 0 0  Altered  sleeping 0 0 0 0   Tired, decreased energy 0 0 0 0   Change in appetite 0 0 0 0   Feeling bad or failure about yourself  0 0 0 0   Trouble concentrating 0 0 0 0   Moving slowly or fidgety/restless 0 0 0 0   Suicidal thoughts 0 0 0 0   PHQ-9 Score 0 0 0 0   Difficult doing work/chores Not difficult at all Not difficult at all Not difficult at all Not difficult at all       07/24/2023    8:22 AM 01/14/2023    2:34 PM 12/18/2022    8:15 AM 07/13/2020    9:05 AM  GAD 7 : Generalized Anxiety Score  Nervous, Anxious, on Edge 0 0 0 0  Control/stop worrying 0 0 0 0  Worry too much - different things 0 0 0 0  Trouble relaxing 0 0 0 0  Restless 0 0 0 0  Easily annoyed or irritable 0 0 0 0  Afraid - awful might happen 0 0 0 0  Total GAD 7 Score 0 0 0 0  Anxiety Difficulty Not difficult at all Not difficult at all Not difficult at all Not difficult at all    ASSESSMENT/PLAN:  Annual physical exam Assessment & Plan: HCM PCV 20 vaccine completed Colonoscopy UTD, sessile polyp repeat in 5 years. Due 02/2026.  Follows with Dr Norma Fredrickson, [facility]  Mammogram due  10/24.  Referral placed today Recommend regular self breast exams  Dexa completed Shingles completed      Chronic venous insufficiency of lower extremity Assessment & Plan: Chronic. Restart Lasix 10 mg daily as needed  Elevate legs Monitor fluid and sodium intake Compression stockings daily Strict return precautions provided.   Mitral valve stenosis, unspecified etiology Assessment & Plan: Mild SOB on exertion and increase in weight Restart Lasix 10 mg daily as needed Mild Mitral stenosis on previous ECHO.   Follow up with Cardiology after next ECHO per Cards note 11/26/21 Strict return precautions provided   Breast cancer screening by mammogram -     3D Screening Mammogram, Left and Right; Future   PDMP reviewed  Return if symptoms worsen or fail to improve.  Dana Allan, MD

## 2023-08-20 ENCOUNTER — Encounter: Payer: Self-pay | Admitting: Family Medicine

## 2023-08-21 DIAGNOSIS — L814 Other melanin hyperpigmentation: Secondary | ICD-10-CM | POA: Diagnosis not present

## 2023-08-21 DIAGNOSIS — D2272 Melanocytic nevi of left lower limb, including hip: Secondary | ICD-10-CM | POA: Diagnosis not present

## 2023-08-21 DIAGNOSIS — D2262 Melanocytic nevi of left upper limb, including shoulder: Secondary | ICD-10-CM | POA: Diagnosis not present

## 2023-08-21 DIAGNOSIS — L570 Actinic keratosis: Secondary | ICD-10-CM | POA: Diagnosis not present

## 2023-08-21 DIAGNOSIS — L821 Other seborrheic keratosis: Secondary | ICD-10-CM | POA: Diagnosis not present

## 2023-08-21 DIAGNOSIS — D2271 Melanocytic nevi of right lower limb, including hip: Secondary | ICD-10-CM | POA: Diagnosis not present

## 2023-08-21 DIAGNOSIS — D225 Melanocytic nevi of trunk: Secondary | ICD-10-CM | POA: Diagnosis not present

## 2023-08-21 DIAGNOSIS — D2261 Melanocytic nevi of right upper limb, including shoulder: Secondary | ICD-10-CM | POA: Diagnosis not present

## 2023-08-21 NOTE — Telephone Encounter (Signed)
FYI

## 2023-10-03 ENCOUNTER — Ambulatory Visit
Admission: RE | Admit: 2023-10-03 | Discharge: 2023-10-03 | Disposition: A | Discharge status: Home / Self Care | Payer: Medicare PPO | Source: Ambulatory Visit | Attending: Family Medicine | Admitting: Family Medicine

## 2023-10-03 DIAGNOSIS — Z1231 Encounter for screening mammogram for malignant neoplasm of breast: Secondary | ICD-10-CM | POA: Diagnosis not present

## 2023-10-06 ENCOUNTER — Encounter: Payer: Self-pay | Admitting: Family Medicine

## 2023-10-07 NOTE — Telephone Encounter (Signed)
Fyi, pt declines appt at this time

## 2023-10-22 ENCOUNTER — Encounter: Payer: Self-pay | Admitting: Family Medicine

## 2023-10-24 ENCOUNTER — Encounter: Payer: Self-pay | Admitting: Family Medicine

## 2023-10-24 ENCOUNTER — Other Ambulatory Visit: Payer: Self-pay | Admitting: Family Medicine

## 2023-10-24 DIAGNOSIS — I739 Peripheral vascular disease, unspecified: Secondary | ICD-10-CM

## 2023-10-24 DIAGNOSIS — R600 Localized edema: Secondary | ICD-10-CM

## 2023-10-24 DIAGNOSIS — I872 Venous insufficiency (chronic) (peripheral): Secondary | ICD-10-CM

## 2023-10-24 DIAGNOSIS — G629 Polyneuropathy, unspecified: Secondary | ICD-10-CM

## 2023-11-05 ENCOUNTER — Telehealth: Payer: Self-pay | Admitting: Cardiology

## 2023-11-05 NOTE — Telephone Encounter (Signed)
Patient states that she received a VM that an appointment needed to be rescheduled with our office but it did not indicate whether it was for her or her husband Shaasia Meidl. I don't see that any appointments need to be rescheduled for either patient, please advise.

## 2023-11-21 ENCOUNTER — Ambulatory Visit: Payer: Medicare PPO | Attending: Cardiology

## 2023-11-21 DIAGNOSIS — I050 Rheumatic mitral stenosis: Secondary | ICD-10-CM

## 2023-11-21 LAB — ECHOCARDIOGRAM COMPLETE
AR max vel: 1.82 cm2
AV Area VTI: 1.91 cm2
AV Area mean vel: 1.92 cm2
AV Mean grad: 7 mm[Hg]
AV Peak grad: 13.8 mm[Hg]
Ao pk vel: 1.86 m/s
Area-P 1/2: 3.93 cm2
Calc EF: 53.1 %
MV VTI: 1.86 cm2
S' Lateral: 2.6 cm
Single Plane A2C EF: 53.3 %
Single Plane A4C EF: 53.5 %

## 2023-12-22 ENCOUNTER — Encounter: Payer: Self-pay | Admitting: Cardiology

## 2023-12-22 ENCOUNTER — Ambulatory Visit: Payer: Medicare PPO | Attending: Cardiology | Admitting: Cardiology

## 2023-12-22 VITALS — BP 140/80 | HR 100 | Ht 59.0 in | Wt 137.8 lb

## 2023-12-22 DIAGNOSIS — I050 Rheumatic mitral stenosis: Secondary | ICD-10-CM | POA: Diagnosis not present

## 2023-12-22 DIAGNOSIS — R030 Elevated blood-pressure reading, without diagnosis of hypertension: Secondary | ICD-10-CM | POA: Diagnosis not present

## 2023-12-22 DIAGNOSIS — E7800 Pure hypercholesterolemia, unspecified: Secondary | ICD-10-CM

## 2023-12-22 DIAGNOSIS — Z01810 Encounter for preprocedural cardiovascular examination: Secondary | ICD-10-CM | POA: Diagnosis not present

## 2023-12-22 NOTE — Progress Notes (Signed)
 Cardiology Office Note:    Date:  12/22/2023   ID:  Carol Stone, DOB 1946/04/05, MRN [id number]  PCP:  Hope Merle, MD  Cardiologist:  Redell Cave, MD  Electrophysiologist:  None   Referring MD: Hope Merle, MD   Chief Complaint  Patient presents with   Follow-up    Patient denies new or acute cardiac problems/concerns today.      History of Present Illness:    Carol Stone is a 78 y.o. female with a hx of mild mitral stenosis, hyperlipidemia, varicose veins who presents for follow-up.    Being seen due to mitral stenosis.  Denies chest pain or shortness of breath.  States having a hiatal hernia, is considering repair in the near future.  Otherwise feels well, has no concerns at this time.  Repeat echocardiogram obtained last month.   Prior notes Echo 11/2021, EF 60%, mild mitral stenosis, mean gradient 3.7 mmHg Echo 11/07/2020 EF 55 to 60%, mild mitral stenosis mean gradient 4 mmHg.    Past Medical History:  Diagnosis Date   Allergy    Arthritis    Arthritis of lumbar spine 04/02/2021   Arthritis of neck 04/02/2021   Back spasm 08/22/2021   Capillaritis 03/23/2019   Cataract    Cervicalgia 12/26/2021   Decreased GFR 07/09/2021   06/26/21 GFR 48     Early satiety 01/17/2021   Elevated blood pressure reading 11/23/2018   Fracture Risk Assessment Score (FRAX) indicating greater than 3% risk for hip fracture 05/24/2022   Frequent headaches    seasonal   GERD (gastroesophageal reflux disease)    History of chicken pox    History of colon polyps    History of osteoporosis 05/24/2022   Hypercalcemia 07/09/2021   Hypercholesteremia    Hypoglycemia 01/17/2021   IBS (irritable bowel syndrome)    constipation    Immunization reaction 01/13/2020   Impingement syndrome of left shoulder 06/25/2020   Dr. Leora see 06/21/20 [facility]    Insomnia 11/23/2018   Leg edema 01/13/2020   Lymphedema 01/17/2021   Neuropathy 08/22/2021   Numbness of toes  01/17/2021   OSA on CPAP    Osteopenia 05/24/2022   Osteoporosis    Sinus tachycardia 07/13/2020   Sleep apnea    Subconjunctival hemorrhage of left eye 01/17/2021   Late 12/2020 resolved after 2 weeks    Thoracic spondylosis without myelopathy 10/18/2021   Tinnitus    Venous insufficiency     Past Surgical History:  Procedure Laterality Date   ABDOMINAL HYSTERECTOMY     carpel tunnel surgery-left Left    July 2023   COLONOSCOPY WITH PROPOFOL  N/A 03/14/2016   Procedure: COLONOSCOPY WITH PROPOFOL ;  Surgeon: Deward CINDERELLA Piedmont, MD;  Location: [facility] ENDOSCOPY;  Service: Gastroenterology;  Laterality: N/A;   DILATION AND CURETTAGE OF UTERUS     TONSILLECTOMY      Current Medications: Current Meds  Medication Sig   atorvastatin  (LIPITOR) 10 MG tablet Take 1 tablet (10 mg total) by mouth daily at 6 PM.   calcium -vitamin D  (OSCAL WITH D) 500-200 MG-UNIT tablet Take 1 tablet by mouth.   diclofenac sodium (VOLTAREN) 1 % GEL Apply topically 4 (four) times daily. Prn   famotidine (PEPCID) 20 MG tablet Take 20 mg by mouth 2 (two) times daily.   Multiple Vitamin (MULTIVITAMIN) tablet Take 1 tablet by mouth daily.   RESTASIS 0.05 % ophthalmic emulsion PLACE 1 DROP INTO BOTH EYES TWICE A DAY     Allergies:  Bactrim [sulfamethoxazole-trimethoprim], Codeine, Compazine [prochlorperazine edisylate], Demerol [meperidine], Indocin [indomethacin], Other, and Reglan [metoclopramide]   Social History   Socioeconomic History   Marital status: Married    Spouse name: Not on file   Number of children: Not on file   Years of education: Not on file   Highest education level: Not on file  Occupational History   Not on file  Tobacco Use   Smoking status: Former   Smokeless tobacco: Never   Tobacco comments:    age [age] light   Vaping Use   Vaping status: Never Used  Substance and Sexual Activity   Alcohol use: No   Drug use: No   Sexual activity: Not Currently    Birth control/protection: Surgical   Other Topics Concern   Not on file  Social History Narrative   Married, husband in house    Wears seat belt, husband owns guns, safe in relationship    No kids    Masters, previous nurse office work    No kids    Retired    Teacher, Early Years/pre Strain: Low Risk  (01/23/2023)   Overall Financial Resource Strain (CARDIA)    Difficulty of Paying Living Expenses: Not hard at all  Food Insecurity: No Food Insecurity (01/23/2023)   Hunger Vital Sign    Worried About Running Out of Food in the Last Year: Never true    Ran Out of Food in the Last Year: Never true  Transportation Needs: No Transportation Needs (01/23/2023)   PRAPARE - Administrator, Civil Service (Medical): No    Lack of Transportation (Non-Medical): No  Physical Activity: Sufficiently Active (01/23/2023)   Exercise Vital Sign    Days of Exercise per Week: 7 days    Minutes of Exercise per Session: 60 min  Stress: No Stress Concern Present (01/23/2023)   [employer] of Occupational Health - Occupational Stress Questionnaire    Feeling of Stress : Not at all  Social Connections: Unknown (05/26/2023)   [facility]   Social Network    Social Network: Not on file     Family History: The patient's family history includes AAA (abdominal aortic aneurysm) in her paternal grandmother; Breast cancer (age of onset: 70) in her maternal aunt; Cancer in her father, maternal aunt, paternal grandfather, and sister; Deep vein thrombosis in her sister; Heart disease in her maternal grandmother; Hypertension in her father and paternal grandmother; Lung cancer in her sister; Osteoporosis in her mother; Prostate cancer in her father; Pulmonary embolism in her sister; Stroke in her mother.  ROS:   Please see the history of present illness.     All other systems reviewed and are negative.  EKGs/Labs/Other Studies Reviewed:    The following studies were reviewed  today:   EKG Interpretation Date/Time:  Monday December 22 2023 08:21:10 EST Ventricular Rate:  100 PR Interval:  140 QRS Duration:  86 QT Interval:  334 QTC Calculation: 430 R Axis:   -16  Text Interpretation: Normal sinus rhythm Normal ECG Confirmed by Darliss Rogue ([id number]) on 12/22/2023 8:23:07 AM    Recent Labs: 06/13/2023: ALT 17; BUN 20; Creatinine, Ser 0.98; Hemoglobin 13.3; Platelets 264.0; Potassium 4.2; Sodium 141  Recent Lipid Panel    Component Value Date/Time   CHOL 197 06/13/2023 0751   TRIG 144.0 06/13/2023 0751   HDL 58.90 06/13/2023 0751   CHOLHDL 3 06/13/2023 0751   VLDL 28.8 06/13/2023 0751  LDLCALC 110 (H) 06/13/2023 0751    Physical Exam:    VS:  BP (!) 140/80 (BP Location: Left Arm, Patient Position: Sitting, Cuff Size: Large)   Pulse 100   Ht 4' 11 (1.499 m)   Wt 137 lb 12.8 oz (62.5 kg)   SpO2 99%   BMI 27.83 kg/m     Wt Readings from Last 3 Encounters:  12/22/23 137 lb 12.8 oz (62.5 kg)  07/24/23 133 lb 8 oz (60.6 kg)  01/23/23 136 lb 12.8 oz (62.1 kg)     GEN:  Well nourished, well developed in no acute distress HEENT: Normal NECK: No JVD; No carotid bruits LYMPHATICS: No lymphadenopathy CARDIAC: RRR, no murmurs, rubs, gallops RESPIRATORY:  Clear to auscultation without rales, wheezing or rhonchi  ABDOMEN: Soft, non-tender, non-distended MUSCULOSKELETAL:  No edema; varicose veins noted SKIN: Warm and dry NEUROLOGIC:  Alert and oriented x 3 PSYCHIATRIC:  Normal affect   ASSESSMENT:    1. Mitral valve stenosis, unspecified etiology   2. Pure hypercholesterolemia   3. Elevated BP without diagnosis of hypertension   4. Pre-procedural cardiovascular examination    PLAN:    In order of problems listed above:  Mild mitral stenosis, repeat echo 11/2023 showed normal EF 60-65%, mild mitral stenosis, mean mitral gradient 3.5 mmHg.  Not significantly different from prior in 2022.  Continue monitoring with serial echoes. frequency to  every 2 to 3 years. Hyperlipidemia, continue Lipitor. BP elevated today, usually controlled.  Continue to monitor off BP meds. Preprocedural eval, however hernia repair being considered in the near future.  Okay for surgical procedure from a cardiac perspective.  Follow-up in 1 year.   Medication Adjustments/Labs and Tests Ordered: Current medicines are reviewed at length with the patient today.  Concerns regarding medicines are outlined above.  Orders Placed This Encounter  Procedures   EKG 12-Lead   No orders of the defined types were placed in this encounter.   Patient Instructions  Medication Instructions:   Your physician recommends that you continue on your current medications as directed. Please refer to the Current Medication list given to you today.  *If you need a refill on your cardiac medications before your next appointment, please call your pharmacy*   Lab Work:  No labs ordered today   If you have labs (blood work) drawn today and your tests are completely normal, you will receive your results only by: MyChart Message (if you have MyChart) OR A paper copy in the mail If you have any lab test that is abnormal or we need to change your treatment, we will call you to review the results.   Testing/Procedures:  No test ordered today    Follow-Up: At [facility], you and your health needs are our priority.  As part of our continuing mission to provide you with exceptional heart care, we have created designated Provider Care Teams.  These Care Teams include your primary Cardiologist (physician) and Advanced Practice Providers (APPs -  Physician Assistants and Nurse Practitioners) who all work together to provide you with the care you need, when you need it.  We recommend signing up for the patient portal called MyChart.  Sign up information is provided on this After Visit Summary.  MyChart is used to connect with patients for Virtual Visits  (Telemedicine).  Patients are able to view lab/test results, encounter notes, upcoming appointments, etc.  Non-urgent messages can be sent to your provider as well.   To learn more about what  you can do with MyChart, go to [email or web address].    Your next appointment:   1 year(s)  Provider:   Redell Cave, MD    Other Instructions          Signed, Redell Cave, MD  12/22/2023 10:10 AM    [facility]

## 2023-12-22 NOTE — Patient Instructions (Signed)
 Medication Instructions:   Your physician recommends that you continue on your current medications as directed. Please refer to the Current Medication list given to you today.  *If you need a refill on your cardiac medications before your next appointment, please call your pharmacy*   Lab Work:  No labs ordered today   If you have labs (blood work) drawn today and your tests are completely normal, you will receive your results only by: MyChart Message (if you have MyChart) OR A paper copy in the mail If you have any lab test that is abnormal or we need to change your treatment, we will call you to review the results.   Testing/Procedures:  No test ordered today    Follow-Up: At [facility], you and your health needs are our priority.  As part of our continuing mission to provide you with exceptional heart care, we have created designated Provider Care Teams.  These Care Teams include your primary Cardiologist (physician) and Advanced Practice Providers (APPs -  Physician Assistants and Nurse Practitioners) who all work together to provide you with the care you need, when you need it.  We recommend signing up for the patient portal called MyChart.  Sign up information is provided on this After Visit Summary.  MyChart is used to connect with patients for Virtual Visits (Telemedicine).  Patients are able to view lab/test results, encounter notes, upcoming appointments, etc.  Non-urgent messages can be sent to your provider as well.   To learn more about what you can do with MyChart, go to [email or web address].    Your next appointment:   1 year(s)  Provider:   Redell Cave, MD    Other Instructions

## 2023-12-24 DIAGNOSIS — R0602 Shortness of breath: Secondary | ICD-10-CM | POA: Diagnosis not present

## 2023-12-24 DIAGNOSIS — R918 Other nonspecific abnormal finding of lung field: Secondary | ICD-10-CM | POA: Diagnosis not present

## 2023-12-25 ENCOUNTER — Telehealth: Payer: Self-pay

## 2023-12-25 MED ORDER — DENOSUMAB 60 MG/ML ~~LOC~~ SOSY
60.0000 mg | PREFILLED_SYRINGE | Freq: Once | SUBCUTANEOUS | Status: AC
  Administered 2024-01-09: 60 mg via SUBCUTANEOUS

## 2023-12-25 NOTE — Addendum Note (Signed)
Addended by: Warden Fillers on: 12/25/2023 11:00 AM   Modules accepted: Orders

## 2024-01-02 ENCOUNTER — Other Ambulatory Visit: Payer: Self-pay | Admitting: *Deleted

## 2024-01-02 DIAGNOSIS — M810 Age-related osteoporosis without current pathological fracture: Secondary | ICD-10-CM

## 2024-01-08 ENCOUNTER — Other Ambulatory Visit (HOSPITAL_COMMUNITY): Payer: Self-pay

## 2024-01-08 NOTE — Telephone Encounter (Signed)
Noted, thanks!

## 2024-01-08 NOTE — Telephone Encounter (Signed)
Received the same response from [employer]-Pt scheduled fro Prolia on 1/31.   Please run test claim to determine benefit coverage. At last visit it was $0 due.  PA on file

## 2024-01-08 NOTE — Telephone Encounter (Signed)
Pharmacy Patient Advocate Encounter  Insurance verification completed.   The patient is insured through [employer] claim for [employer]. Currently a quantity of is a 180 day supply and the co-pay is $60 .  This test claim was processed through [facility]- copay amounts may vary at other pharmacies due to pharmacy/plan contracts, or as the patient moves through the different stages of their insurance plan.

## 2024-01-09 ENCOUNTER — Ambulatory Visit (INDEPENDENT_AMBULATORY_CARE_PROVIDER_SITE_OTHER): Payer: Medicare PPO

## 2024-01-09 DIAGNOSIS — M810 Age-related osteoporosis without current pathological fracture: Secondary | ICD-10-CM | POA: Diagnosis not present

## 2024-01-09 MED ORDER — DENOSUMAB 60 MG/ML ~~LOC~~ SOSY: 60.0000 mg | PREFILLED_SYRINGE | Freq: Once | SUBCUTANEOUS | Status: AC

## 2024-01-09 NOTE — Progress Notes (Signed)
Patient presented for Prolia injection to her left arm, patient voiced no concerns nor showed any signs of distress during injection

## 2024-01-10 ENCOUNTER — Encounter: Payer: Self-pay | Admitting: Family

## 2024-01-10 LAB — VITAMIN D 25 HYDROXY (VIT D DEFICIENCY, FRACTURES): Vit D, 25-Hydroxy: 61 ng/mL (ref 30–100)

## 2024-01-12 ENCOUNTER — Other Ambulatory Visit (HOSPITAL_COMMUNITY): Payer: Self-pay

## 2024-01-12 NOTE — Telephone Encounter (Signed)
Deductibe: $83.92 met out of $192 required, 4% coinsurance, 4% admin fee, PA required

## 2024-01-12 NOTE — Telephone Encounter (Signed)
Pharmacy Patient Advocate Encounter   Received notification from  INSURANCE  that prior authorization for PROLIA is required/requested.   Insurance verification completed.   The patient is insured through [employer] .   Per test claim: PA required; PA submitted to above mentioned insurance via CoverMyMeds Key/confirmation #[id number] Status is pending

## 2024-01-14 ENCOUNTER — Other Ambulatory Visit (HOSPITAL_COMMUNITY): Payer: Self-pay

## 2024-01-14 NOTE — Telephone Encounter (Signed)
 Pharmacy Patient Advocate Encounter  Received notification from [employer] that Prior Authorization for PROLIA  has been APPROVED from 06/08/22 to 12/08/24. Ran test claim, Copay is $60. This test claim was processed through [facility]- copay amounts may vary at other pharmacies due to pharmacy/plan contracts, or as the patient moves through the different stages of their insurance plan.   PA #/Case ID/Reference #: [id number]

## 2024-01-19 DIAGNOSIS — H2512 Age-related nuclear cataract, left eye: Secondary | ICD-10-CM | POA: Diagnosis not present

## 2024-01-19 DIAGNOSIS — H01003 Unspecified blepharitis right eye, unspecified eyelid: Secondary | ICD-10-CM | POA: Diagnosis not present

## 2024-01-19 DIAGNOSIS — Z0100 Encounter for examination of eyes and vision without abnormal findings: Secondary | ICD-10-CM | POA: Diagnosis not present

## 2024-01-19 DIAGNOSIS — M3501 Sicca syndrome with keratoconjunctivitis: Secondary | ICD-10-CM | POA: Diagnosis not present

## 2024-01-19 DIAGNOSIS — H4322 Crystalline deposits in vitreous body, left eye: Secondary | ICD-10-CM | POA: Diagnosis not present

## 2024-01-26 ENCOUNTER — Ambulatory Visit (INDEPENDENT_AMBULATORY_CARE_PROVIDER_SITE_OTHER): Payer: Medicare PPO | Admitting: *Deleted

## 2024-01-26 VITALS — Ht 58.5 in | Wt 134.0 lb

## 2024-01-26 DIAGNOSIS — Z0000 Encounter for general adult medical examination without abnormal findings: Secondary | ICD-10-CM | POA: Diagnosis not present

## 2024-01-26 NOTE — Patient Instructions (Signed)
 Ms. Rosero , Thank you for taking time to come for your Medicare Wellness Visit. I appreciate your ongoing commitment to your health goals. Please review the following plan we discussed and let me know if I can assist you in the future.   Referrals/Orders/Follow-Ups/Clinician Recommendations: None  This is a list of the screening recommended for you and due dates:  Health Maintenance  Topic Date Due   COVID-19 Vaccine (10 - 2024-25 season) 10/14/2023   Mammogram  10/02/2024   Medicare Annual Wellness Visit  01/25/2025   Colon Cancer Screening  02/27/2026   DTaP/Tdap/Td vaccine (4 - Td or Tdap) 08/16/2026   Flu Shot  Completed   DEXA scan (bone density measurement)  Completed   Hepatitis C Screening  Completed   Zoster (Shingles) Vaccine  Completed   HPV Vaccine  Aged Out   Pneumonia Vaccine  Discontinued    Advanced directives: (Copy Requested) Please bring a copy of your health care power of attorney and living will to the office to be added to your chart at your convenience.  Next Medicare Annual Wellness Visit scheduled for next year: Yes 01/28/25 @ 8:50

## 2024-01-26 NOTE — Progress Notes (Signed)
 Subjective:   Carol Stone is a 78 y.o. female who presents for Medicare Annual (Subsequent) preventive examination.  Visit Complete: Virtual I connected with  Earlean Shawl on 01/26/24 by a audio enabled telemedicine application and verified that I am speaking with the correct person using two identifiers.This patient declined Interactive audio and Acupuncturist. Therefore the visit was completed with audio only.   Patient Location: Home  Provider Location: Office/Clinic  I discussed the limitations of evaluation and management by telemedicine. The patient expressed understanding and agreed to proceed.  Vital Signs: Because this visit was a virtual/telehealth visit, some criteria may be missing or patient reported. Any vitals not documented were not able to be obtained and vitals that have been documented are patient reported.   Cardiac Risk Factors include: advanced age (>34men, >67 women);dyslipidemia     Objective:    Today's Vitals   01/26/24 1345  Weight: 134 lb (60.8 kg)  Height: 4' 10.5" (1.486 m)   Body mass index is 27.53 kg/m.     01/26/2024    1:56 PM 01/23/2023    2:14 PM 01/03/2022    9:11 AM 01/02/2021    9:31 AM 07/09/2019   11:04 AM 06/14/2019   10:22 AM 03/03/2016    5:38 PM  Advanced Directives  Does Patient Have a Medical Advance Directive? Yes Yes Yes Yes Yes Yes Yes  Type of Estate agent of [place];Living will Healthcare Power of [place];Living will Healthcare Power of [place];Living will  Healthcare Power of [place];Living will Healthcare Power of [place];Living will Healthcare Power of [place];Living will  Does patient want to make changes to medical advance directive?   No - Patient declined No - Patient declined No - Patient declined No - Patient declined   Copy of Healthcare Power of Attorney in Chart? No - copy requested No - copy requested No - copy requested  No - copy requested  Yes    Current  Medications (verified) Outpatient Encounter Medications as of 01/26/2024  Medication Sig   atorvastatin (LIPITOR) 10 MG tablet Take 1 tablet (10 mg total) by mouth daily at 6 PM.   calcium-vitamin D (OSCAL WITH D) 500-200 MG-UNIT tablet Take 1 tablet by mouth.   diclofenac sodium (VOLTAREN) 1 % GEL Apply topically 4 (four) times daily. Prn   famotidine (PEPCID) 20 MG tablet Take 20 mg by mouth 2 (two) times daily.   Multiple Vitamin (MULTIVITAMIN) tablet Take 1 tablet by mouth daily.   Probiotic Product (ALIGN) 4 MG CAPS    RESTASIS 0.05 % ophthalmic emulsion PLACE 1 DROP INTO BOTH EYES TWICE A DAY   Facility-Administered Encounter Medications as of 01/26/2024  Medication   [START ON 07/08/2024] denosumab (PROLIA) injection 60 mg    Allergies (verified) Bactrim [sulfamethoxazole-trimethoprim], Codeine, Compazine [prochlorperazine edisylate], Demerol [meperidine], Indocin [indomethacin], Other, and Reglan [metoclopramide]   History: Past Medical History:  Diagnosis Date   Allergy    Arthritis    Arthritis of lumbar spine 04/02/2021   Arthritis of neck 04/02/2021   Back spasm 08/22/2021   Capillaritis 03/23/2019   Cataract    Cervicalgia 12/26/2021   Decreased GFR 07/09/2021   06/26/21 GFR 48     Early satiety 01/17/2021   Elevated blood pressure reading 11/23/2018   Fracture Risk Assessment Score (FRAX) indicating greater than 3% risk for hip fracture 05/24/2022   Frequent headaches    seasonal   GERD (gastroesophageal reflux disease)    History of chicken pox  History of colon polyps    History of osteoporosis 05/24/2022   Hypercalcemia 07/09/2021   Hypercholesteremia    Hypoglycemia 01/17/2021   IBS (irritable bowel syndrome)    constipation    Immunization reaction 01/13/2020   Impingement syndrome of left shoulder 06/25/2020   Dr. Odis Luster see 06/21/20 [facility]    Insomnia 11/23/2018   Leg edema 01/13/2020   Lymphedema 01/17/2021   Neuropathy 08/22/2021    Numbness of toes 01/17/2021   OSA on CPAP    Osteopenia 05/24/2022   Osteoporosis    Sinus tachycardia 07/13/2020   Sleep apnea    Subconjunctival hemorrhage of left eye 01/17/2021   Late 12/2020 resolved after 2 weeks    Thoracic spondylosis without myelopathy 10/18/2021   Tinnitus    Venous insufficiency    Past Surgical History:  Procedure Laterality Date   ABDOMINAL HYSTERECTOMY     carpel tunnel surgery-left Left    July 2023   COLONOSCOPY WITH PROPOFOL N/A 03/14/2016   Procedure: COLONOSCOPY WITH PROPOFOL;  Surgeon: Wallace Cullens, MD;  Location: [facility] ENDOSCOPY;  Service: Gastroenterology;  Laterality: N/A;   DILATION AND CURETTAGE OF UTERUS     TONSILLECTOMY     Family History  Problem Relation Age of Onset   Prostate cancer Father    Cancer Father        prostate    Hypertension Father    Lung cancer Sister    Cancer Sister        lung smoker    Deep vein thrombosis Sister    Pulmonary embolism Sister    Breast cancer Maternal Aunt 76   Cancer Maternal Aunt        breast   Stroke Mother    Osteoporosis Mother    Heart disease Maternal Grandmother        CHF   AAA (abdominal aortic aneurysm) Paternal Grandmother    Hypertension Paternal Grandmother    Cancer Paternal Grandfather        prostate   Social History   Socioeconomic History   Marital status: Married    Spouse name: Not on file   Number of children: Not on file   Years of education: Not on file   Highest education level: Not on file  Occupational History   Not on file  Tobacco Use   Smoking status: Former   Smokeless tobacco: Never   Tobacco comments:    age [age] light   Vaping Use   Vaping status: Never Used  Substance and Sexual Activity   Alcohol use: No   Drug use: No   Sexual activity: Not Currently    Birth control/protection: Surgical  Other Topics Concern   Not on file  Social History Narrative   Married, husband in house    Wears seat belt, husband owns guns, safe in  relationship    No kids    Masters, previous nurse office work    No kids    Retired    Teacher, early years/pre Strain: Low Risk  (01/26/2024)   Overall Financial Resource Strain (CARDIA)    Difficulty of Paying Living Expenses: Not hard at all  Food Insecurity: No Food Insecurity (01/26/2024)   Hunger Vital Sign    Worried About Running Out of Food in the Last Year: Never true    Ran Out of Food in the Last Year: Never true  Transportation Needs: No Transportation Needs (01/26/2024)   PRAPARE - Transportation  Lack of Transportation (Medical): No    Lack of Transportation (Non-Medical): No  Physical Activity: Sufficiently Active (01/26/2024)   Exercise Vital Sign    Days of Exercise per Week: 7 days    Minutes of Exercise per Session: 50 min  Stress: No Stress Concern Present (01/26/2024)   [employer] of Occupational Health - Occupational Stress Questionnaire    Feeling of Stress : Not at all  Social Connections: Socially Integrated (01/26/2024)   Social Connection and Isolation Panel [NHANES]    Frequency of Communication with Friends and Family: More than three times a week    Frequency of Social Gatherings with Friends and Family: More than three times a week    Attends Religious Services: More than 4 times per year    Active Member of [employer] or Organizations: Yes    Attends Engineer, structural: More than 4 times per year    Marital Status: Married    Tobacco Counseling Counseling given: Not Answered Tobacco comments: age [age] light    Clinical Intake:  Pre-visit preparation completed: Yes  Pain : No/denies pain     BMI - recorded: 27.53 Nutritional Status: BMI 25 -29 Overweight Nutritional Risks: None Diabetes: No  How often do you need to have someone help you when you read instructions, pamphlets, or other written materials from your doctor or pharmacy?: 1 - Never  Interpreter Needed?: No  Information entered by :: R.  Joi Leyva LPN   Activities of Daily Living    01/26/2024    1:47 PM  In your present state of health, do you have any difficulty performing the following activities:  Hearing? 1  Vision? 0  Difficulty concentrating or making decisions? 0  Walking or climbing stairs? 0  Dressing or bathing? 0  Doing errands, shopping? 0  Preparing Food and eating ? N  Using the Toilet? N  In the past six months, have you accidently leaked urine? N  Do you have problems with loss of bowel control? N  Managing your Medications? N  Managing your Finances? N  Housekeeping or managing your Housekeeping? N    Patient Care Team: Dana Allan, MD as PCP - General (Family Medicine) Debbe Odea, MD as PCP - Cardiology (Cardiology)  Indicate any recent Medical Services you may have received from other than [facility] providers in the past year (date may be approximate).     Assessment:   This is a routine wellness examination for Renn.  Hearing/Vision screen Hearing Screening - Comments:: Some issues Vision Screening - Comments:: cataracts   Goals Addressed             This Visit's Progress    Patient Stated       Wants to lose a little weight       Depression Screen    01/26/2024    1:51 PM 07/24/2023    8:21 AM 01/23/2023    2:13 PM 01/14/2023    2:34 PM 12/18/2022    8:15 AM 10/14/2022   11:27 AM 09/30/2022    1:25 PM  PHQ 2/9 Scores  PHQ - 2 Score 0 0 0 0 0 0 0  PHQ- 9 Score 0 0 0 0 0      Fall Risk    01/26/2024    1:48 PM 07/24/2023    8:21 AM 01/23/2023    2:12 PM 01/23/2023    1:29 PM 01/19/2023    3:27 PM  Fall Risk   Falls in the past year?  0 0 1 1 1   Number falls in past yr: 0 0 0 0 0  Injury with Fall? 0 0 1 1 1   Risk for fall due to : No Fall Risks No Fall Risks No Fall Risks    Follow up Falls prevention discussed;Falls evaluation completed Falls evaluation completed Falls evaluation completed      MEDICARE RISK AT HOME: Medicare Risk at Home Any stairs in or  around the home?: Yes If so, are there any without handrails?: No Home free of loose throw rugs in walkways, pet beds, electrical cords, etc?: Yes Adequate lighting in your home to reduce risk of falls?: Yes Life alert?: No Use of a cane, walker or w/c?: No Grab bars in the bathroom?: Yes Shower chair or bench in shower?: Yes Elevated toilet seat or a handicapped toilet?: No   Cognitive Function:        01/26/2024    1:56 PM 01/23/2023    2:16 PM 07/09/2019   11:09 AM  6CIT Screen  What Year? 0 points 0 points 0 points  What month? 0 points 0 points 0 points  What time? 0 points 0 points 0 points  Count back from 20 0 points 0 points 0 points  Months in reverse 0 points 0 points 0 points  Repeat phrase 0 points 4 points 0 points  Total Score 0 points 4 points 0 points    Immunizations Immunization History  Administered Date(s) Administered   Fluad Quad(high Dose 65+) 08/22/2021, 10/02/2023   Influenza, High Dose Seasonal PF 08/22/2021   Influenza-Unspecified 09/29/2014, 10/04/2015, 10/04/2016, 10/02/2017, 10/08/2018, 09/16/2019, 08/16/2020, 09/24/2022   Moderna Sars-Covid-2 Vaccination 12/21/2019, 01/21/2020, 10/24/2020   PFIZER Comirnaty(Gray Top)Covid-19 Tri-Sucrose Vaccine 12/21/2019, 01/21/2020, 10/24/2020   PNEUMOCOCCAL CONJUGATE-20 09/30/2022   Pfizer Covid-19 Vaccine Bivalent Booster 14yrs & up 08/30/2021   Pneumococcal Conjugate-13 06/30/2014, 09/13/2016   Pneumococcal-Unspecified 08/25/2011   Rsv, Bivalent, Protein Subunit Rsvpref,pf Verdis Frederickson) 10/30/2022   Td 01/17/1999   Td (Adult),unspecified 01/17/1999   Tdap 08/16/2016   Unspecified SARS-COV-2 Vaccination 09/05/2022, 08/19/2023   Zoster Recombinant(Shingrix) 08/25/2018, 10/25/2018, 10/25/2018   Zoster, Live 09/26/2006   Zoster, Unspecified 08/25/2018, 10/25/2018    TDAP status: Up to date  Flu Vaccine status: Up to date  Pneumococcal vaccine status: Up to date  Covid-19 vaccine status: Completed  vaccines  Qualifies for Shingles Vaccine? Yes   Zostavax completed Yes   Shingrix Completed?: Yes  Screening Tests Health Maintenance  Topic Date Due   COVID-19 Vaccine (10 - 2024-25 season) 10/14/2023   Medicare Annual Wellness (AWV)  01/24/2024   MAMMOGRAM  10/02/2024   Colonoscopy  02/27/2026   DTaP/Tdap/Td (4 - Td or Tdap) 08/16/2026   INFLUENZA VACCINE  Completed   DEXA SCAN  Completed   Hepatitis C Screening  Completed   Zoster Vaccines- Shingrix  Completed   HPV VACCINES  Aged Out   Pneumonia Vaccine 33+ Years old  Discontinued    Health Maintenance  Health Maintenance Due  Topic Date Due   COVID-19 Vaccine (10 - 2024-25 season) 10/14/2023   Medicare Annual Wellness (AWV)  01/24/2024    Colorectal cancer screening: Type of screening: Colonoscopy. Completed 02/2021. Repeat every 5 years  Mammogram status: Completed 09/2023. Repeat every year  Bone Density status: Completed 01/2022. Results reflect: Bone density results: OSTEOPOROSIS. Repeat every 2 years.  Lung Cancer Screening: (Low Dose CT Chest recommended if Age 23-80 years, 20 pack-year currently smoking OR have quit w/in 15years.) does not qualify.    Additional  Screening:  Hepatitis C Screening: does qualify; Completed 02/2019  Vision Screening: Recommended annual ophthalmology exams for early detection of glaucoma and other disorders of the eye. Is the patient up to date with their annual eye exam?  Yes  Who is the provider or what is the name of the office in which the patient attends annual eye exams? [facility] If pt is not established with a provider, would they like to be referred to a provider to establish care? No .   Dental Screening: Recommended annual dental exams for proper oral hygiene    Community Resource Referral / Chronic Care Management: CRR required this visit?  No   CCM required this visit?  No     Plan:     I have personally reviewed and noted the following in the  patient's chart:   Medical and social history Use of alcohol, tobacco or illicit drugs  Current medications and supplements including opioid prescriptions. Patient is not currently taking opioid prescriptions. Functional ability and status Nutritional status Physical activity Advanced directives List of other physicians Hospitalizations, surgeries, and ER visits in previous 12 months Vitals Screenings to include cognitive, depression, and falls Referrals and appointments  In addition, I have reviewed and discussed with patient certain preventive protocols, quality metrics, and best practice recommendations. A written personalized care plan for preventive services as well as general preventive health recommendations were provided to patient.     Sydell Axon, LPN   1/61/0960   After Visit Summary: (MyChart) Due to this being a telephonic visit, the after visit summary with patients personalized plan was offered to patient via MyChart   Nurse Notes: None

## 2024-01-31 ENCOUNTER — Other Ambulatory Visit: Payer: Self-pay | Admitting: Family Medicine

## 2024-01-31 DIAGNOSIS — E785 Hyperlipidemia, unspecified: Secondary | ICD-10-CM

## 2024-02-01 ENCOUNTER — Encounter: Payer: Self-pay | Admitting: Family Medicine

## 2024-02-02 ENCOUNTER — Other Ambulatory Visit: Payer: Self-pay

## 2024-02-02 DIAGNOSIS — E785 Hyperlipidemia, unspecified: Secondary | ICD-10-CM

## 2024-02-02 MED ORDER — ATORVASTATIN CALCIUM 10 MG PO TABS: 10.0000 mg | ORAL_TABLET | Freq: Every day | ORAL | 3 refills | Status: DC

## 2024-02-04 ENCOUNTER — Encounter: Payer: Self-pay | Admitting: Ophthalmology

## 2024-02-04 DIAGNOSIS — H2512 Age-related nuclear cataract, left eye: Secondary | ICD-10-CM | POA: Diagnosis not present

## 2024-02-05 ENCOUNTER — Encounter: Payer: Self-pay | Admitting: Ophthalmology

## 2024-02-05 NOTE — Anesthesia Preprocedure Evaluation (Addendum)
 Anesthesia Evaluation    History of Anesthesia Complications (+) PONV and history of anesthetic complications  Airway Mallampati: III  TM Distance: >3 FB Neck ROM: Full    Dental no notable dental hx. (+) Chipped Very tiny chip noted upper incisor Upper bridge permanent:   Pulmonary sleep apnea , former smoker   Pulmonary exam normal breath sounds clear to auscultation       Cardiovascular Normal cardiovascular exam Rhythm:Regular Rate:Normal  11-21-23  1. Left ventricular ejection fraction, by estimation, is 60 to 65%. The  left ventricle has normal function. The left ventricle has no regional  wall motion abnormalities. There is mild left ventricular hypertrophy.  Left ventricular diastolic parameters  are consistent with Grade II diastolic dysfunction (pseudonormalization).   2. Right ventricular systolic function is normal. The right ventricular  size is normal.   3. Left atrial size was mildly dilated.   4. The mitral valve is degenerative. Mild mitral valve regurgitation. The  mean mitral valve gradient is 3.5 mmHg. Moderate to severe mitral annular  calcification.   5. The aortic valve is tricuspid. Aortic valve regurgitation is trivial.   6. The inferior vena cava is normal in size with greater than 50%  respiratory variability, suggesting right atrial pressure of 3 mmHg.     Neuro/Psych  Headaches  Neuromuscular disease    GI/Hepatic hiatal hernia,GERD  ,,  Endo/Other    Renal/GU Renal disease     Musculoskeletal  (+) Arthritis ,    Abdominal   Peds  Hematology   Anesthesia Other Findings Hypercholesteremia  Arthritis Osteoporosis  GERD (gastroesophageal reflux disease) Cataract  Sleep apnea Venous insufficiency  Tinnitus OSA on CPAP IBS (irritable bowel syndrome)  Frequent headaches Allergy  History of colon polyps Elevated blood pressure reading Decreased  GFR Hypoglycemia  Lymphedema Numbness of toes  Hypercalcemia Immunization reaction  Early satiety Insomnia  Subconjunctival hemorrhage of left eye Sinus tachycardia  Capillaritis Neuropathy  Arthritis of neck Impingement syndrome of left shoulder  Arthritis of lumbar spine Osteopenia  Thoracic spondylosis without myelopathy Back spasm  Cervicalgia Fracture Risk Assessment Score (FRAX) indicating greater than 3% risk for hip fracture  History of osteoporosis Leg edema  PONV (postoperative nausea and vomiting) MGUS (monoclonal gammopathy of unknown significance)  Hiatal hernia Grade II diastolic dysfunction  Mild mitral regurgitation by prior echocardiogram Aortic and coronary artery atherosclerotic calcifications.   Reproductive/Obstetrics                              Anesthesia Physical Anesthesia Plan  ASA: 3  Anesthesia Plan: MAC   Post-op Pain Management:    Induction: Intravenous  PONV Risk Score and Plan:   Airway Management Planned: Natural Airway and Nasal Cannula  Additional Equipment:   Intra-op Plan:   Post-operative Plan:   Informed Consent: I have reviewed the patients History and Physical, chart, labs and discussed the procedure including the risks, benefits and alternatives for the proposed anesthesia with the patient or authorized representative who has indicated his/her understanding and acceptance.     Dental Advisory Given  Plan Discussed with: Anesthesiologist, CRNA and Surgeon  Anesthesia Plan Comments: (Patient consented for risks of anesthesia including but not limited to:  - adverse reactions to medications - damage to eyes, teeth, lips or other oral mucosa - nerve damage due to positioning  - sore throat or hoarseness - Damage to heart, brain, nerves, lungs, other parts of body or loss  of life  Patient voiced understanding and assent.)         Anesthesia Quick Evaluation

## 2024-02-13 NOTE — Discharge Instructions (Signed)

## 2024-02-16 ENCOUNTER — Encounter: Payer: Self-pay | Admitting: Family Medicine

## 2024-02-17 ENCOUNTER — Other Ambulatory Visit: Payer: Self-pay

## 2024-02-17 ENCOUNTER — Encounter: Payer: Self-pay | Admitting: Ophthalmology

## 2024-02-17 ENCOUNTER — Ambulatory Visit: Payer: Self-pay | Admitting: Anesthesiology

## 2024-02-17 ENCOUNTER — Encounter
Admission: RE | Disposition: A | Discharge status: Home / Self Care | Payer: Self-pay | Source: Home / Self Care | Attending: Ophthalmology

## 2024-02-17 ENCOUNTER — Ambulatory Visit
Admission: RE | Admit: 2024-02-17 | Discharge: 2024-02-17 | Disposition: A | Discharge status: Home / Self Care | Payer: Medicare PPO | Attending: Ophthalmology | Admitting: Ophthalmology

## 2024-02-17 DIAGNOSIS — I340 Nonrheumatic mitral (valve) insufficiency: Secondary | ICD-10-CM | POA: Diagnosis not present

## 2024-02-17 DIAGNOSIS — G4733 Obstructive sleep apnea (adult) (pediatric): Secondary | ICD-10-CM | POA: Diagnosis not present

## 2024-02-17 DIAGNOSIS — Z87891 Personal history of nicotine dependence: Secondary | ICD-10-CM | POA: Diagnosis not present

## 2024-02-17 DIAGNOSIS — K449 Diaphragmatic hernia without obstruction or gangrene: Secondary | ICD-10-CM | POA: Insufficient documentation

## 2024-02-17 DIAGNOSIS — R519 Headache, unspecified: Secondary | ICD-10-CM | POA: Insufficient documentation

## 2024-02-17 DIAGNOSIS — M1990 Unspecified osteoarthritis, unspecified site: Secondary | ICD-10-CM | POA: Insufficient documentation

## 2024-02-17 DIAGNOSIS — H2512 Age-related nuclear cataract, left eye: Secondary | ICD-10-CM | POA: Insufficient documentation

## 2024-02-17 DIAGNOSIS — K219 Gastro-esophageal reflux disease without esophagitis: Secondary | ICD-10-CM | POA: Insufficient documentation

## 2024-02-17 DIAGNOSIS — E785 Hyperlipidemia, unspecified: Secondary | ICD-10-CM | POA: Diagnosis not present

## 2024-02-17 DIAGNOSIS — I517 Cardiomegaly: Secondary | ICD-10-CM | POA: Diagnosis not present

## 2024-02-17 HISTORY — DX: Nausea with vomiting, unspecified: Z98.890

## 2024-02-17 HISTORY — DX: Nonrheumatic mitral (valve) insufficiency: I34.0

## 2024-02-17 HISTORY — DX: Gastro-esophageal reflux disease without esophagitis: K21.9

## 2024-02-17 HISTORY — DX: Other ill-defined heart diseases: I51.89

## 2024-02-17 HISTORY — DX: Monoclonal gammopathy: D47.2

## 2024-02-17 HISTORY — DX: Atherosclerosis of aorta: I70.0

## 2024-02-17 HISTORY — DX: Diaphragmatic hernia without obstruction or gangrene: K44.9

## 2024-02-17 HISTORY — DX: Nausea with vomiting, unspecified: R11.2

## 2024-02-17 HISTORY — PX: CATARACT EXTRACTION W/PHACO: SHX586

## 2024-02-17 SURGERY — PHACOEMULSIFICATION, CATARACT, WITH IOL INSERTION
Anesthesia: Monitor Anesthesia Care | Laterality: Left

## 2024-02-17 MED ORDER — ARMC OPHTHALMIC DILATING DROPS
1.0000 | OPHTHALMIC | Status: DC | PRN
  Administered 2024-02-17 (×3): 1 via OPHTHALMIC

## 2024-02-17 MED ORDER — LIDOCAINE HCL (PF) 2 % IJ SOLN
INTRAOCULAR | Status: DC | PRN
  Administered 2024-02-17: 1 mL

## 2024-02-17 MED ORDER — MIDAZOLAM HCL 2 MG/2ML IJ SOLN
INTRAMUSCULAR | Status: AC
  Filled 2024-02-17: qty 2

## 2024-02-17 MED ORDER — MOXIFLOXACIN HCL 0.5 % OP SOLN
OPHTHALMIC | Status: DC | PRN
  Administered 2024-02-17: .2 mL via OPHTHALMIC

## 2024-02-17 MED ORDER — FENTANYL CITRATE (PF) 100 MCG/2ML IJ SOLN
INTRAMUSCULAR | Status: AC
  Filled 2024-02-17: qty 2

## 2024-02-17 MED ORDER — FENTANYL CITRATE (PF) 100 MCG/2ML IJ SOLN
INTRAMUSCULAR | Status: DC | PRN
Start: 2024-02-17 — End: 2024-02-17
  Administered 2024-02-17: 50 ug via INTRAVENOUS

## 2024-02-17 MED ORDER — SIGHTPATH DOSE#1 BSS IO SOLN
INTRAOCULAR | Status: DC | PRN
  Administered 2024-02-17: 15 mL

## 2024-02-17 MED ORDER — SIGHTPATH DOSE#1 BSS IO SOLN
INTRAOCULAR | Status: DC | PRN
  Administered 2024-02-17: 45 mL via OPHTHALMIC

## 2024-02-17 MED ORDER — SIGHTPATH DOSE#1 NA CHONDROIT SULF-NA HYALURON 40-17 MG/ML IO SOLN
INTRAOCULAR | Status: DC | PRN
  Administered 2024-02-17: 1 mL via INTRAOCULAR

## 2024-02-17 MED ORDER — MIDAZOLAM HCL 2 MG/2ML IJ SOLN
INTRAMUSCULAR | Status: DC | PRN
  Administered 2024-02-17: 2 mg via INTRAVENOUS

## 2024-02-17 MED ORDER — TETRACAINE HCL 0.5 % OP SOLN
1.0000 [drp] | OPHTHALMIC | Status: DC | PRN
  Administered 2024-02-17 (×3): 1 [drp] via OPHTHALMIC

## 2024-02-17 MED ORDER — BRIMONIDINE TARTRATE-TIMOLOL 0.2-0.5 % OP SOLN
OPHTHALMIC | Status: DC | PRN
  Administered 2024-02-17: 1 [drp] via OPHTHALMIC

## 2024-02-17 MED ORDER — ARMC OPHTHALMIC DILATING DROPS
OPHTHALMIC | Status: AC
  Filled 2024-02-17: qty 0.5

## 2024-02-17 SURGICAL SUPPLY — 20 items
CANNULA ANT/CHMB 27G (MISCELLANEOUS) IMPLANT
CANNULA ANT/CHMB 27GA (MISCELLANEOUS) IMPLANT
CATARACT SUITE SIGHTPATH (MISCELLANEOUS) ×1 IMPLANT
CYSTOTOME ANG REV CUT SHRT 25G (CUTTER) ×1 IMPLANT
CYSTOTOME ANGL RVRS SHRT 25G (CUTTER) ×1 IMPLANT
FEE CATARACT SUITE SIGHTPATH (MISCELLANEOUS) ×1 IMPLANT
GLOVE BIOGEL PI IND STRL 8 (GLOVE) ×1 IMPLANT
GLOVE SURG LX STRL 8.0 MICRO (GLOVE) ×1 IMPLANT
GLOVE SURG PROTEXIS BL SZ6.5 (GLOVE) ×1 IMPLANT
GLOVE SURG SYN 6.5 PF PI BL (GLOVE) ×1 IMPLANT
GLOVE SURG SYN 8.0 (GLOVE) IMPLANT
GLOVE SURG SYN 8.0 PF PI (GLOVE) IMPLANT
LENS IOL TECNIS EYHANCE 22.0 (Intraocular Lens) IMPLANT
NDL FILTER BLUNT 18X1 1/2 (NEEDLE) ×1 IMPLANT
NEEDLE FILTER BLUNT 18X1 1/2 (NEEDLE) ×1 IMPLANT
PACK VIT ANT 23G (MISCELLANEOUS) IMPLANT
RING MALYGIN (MISCELLANEOUS) IMPLANT
SUT ETHILON 10-0 CS-B-6CS-B-6 (SUTURE) IMPLANT
SUTURE EHLN 10-0 CS-B-6CS-B-6 (SUTURE) IMPLANT
SYR 3ML LL SCALE MARK (SYRINGE) ×1 IMPLANT

## 2024-02-17 NOTE — Op Note (Signed)
 PREOPERATIVE DIAGNOSIS:  Nuclear sclerotic cataract of the left eye.   POSTOPERATIVE DIAGNOSIS:  Nuclear sclerotic cataract of the left eye.   OPERATIVE PROCEDURE:ORPROCALL@   SURGEON:  Galen Manila, MD.   ANESTHESIA:  Anesthesiologist: Marisue Humble, MD CRNA: Bynum, [place], CRNA; Deland Pretty, CRNA  1.      Managed anesthesia care. 2.     0.40ml of Shugarcaine was instilled following the paracentesis   COMPLICATIONS:  None.   TECHNIQUE:   Stop and chop   DESCRIPTION OF PROCEDURE:  The patient was examined and consented in the preoperative holding area where the aforementioned topical anesthesia was applied to the left eye and then brought back to the Operating Room where the left eye was prepped and draped in the usual sterile ophthalmic fashion and a lid speculum was placed. A paracentesis was created with the side port blade and the anterior chamber was filled with viscoelastic. A near clear corneal incision was performed with the steel keratome. A continuous curvilinear capsulorrhexis was performed with a cystotome followed by the capsulorrhexis forceps. Hydrodissection and hydrodelineation were carried out with BSS on a blunt cannula. The lens was removed in a stop and chop  technique and the remaining cortical material was removed with the irrigation-aspiration handpiece. The capsular bag was inflated with viscoelastic and the Technis [id number] lens was placed in the capsular bag without complication. The remaining viscoelastic was removed from the eye with the irrigation-aspiration handpiece. The wounds were hydrated. The anterior chamber was flushed with BSS and the eye was inflated to physiologic pressure. 0.24ml Vigamox was placed in the anterior chamber. The wounds were found to be water tight. The eye was dressed with Combigan. The patient was given protective glasses to wear throughout the day and a shield with which to sleep tonight. The patient was also given drops with which to  begin a drop regimen today and will follow-up with me in one day. Implant Name Type Inv. Item Serial No. Manufacturer Lot No. LRB No. Used Action  LENS IOL TECNIS EYHANCE 22.0 - [id number] Intraocular Lens LENS IOL TECNIS EYHANCE 22.0 [id number] [employer]  Left 1 Implanted    Procedure(s) with comments: CATARACT EXTRACTION PHACO AND INTRAOCULAR LENS PLACEMENT (IOC) LEFT (Left) - 5.07 0:36.3  Electronically signed: Galen Manila 02/17/2024 10:18 AM

## 2024-02-17 NOTE — H&P (Signed)
 [facility]   Primary Care Physician:  Dana Allan, MD Ophthalmologist: Dr. Druscilla Brownie  Pre-Procedure History & Physical: HPI:  Carol Stone is a 78 y.o. female here for cataract surgery.   Past Medical History:  Diagnosis Date   Allergy    Aortic calcification (HCC)    Arthritis    Arthritis of lumbar spine 04/02/2021   Arthritis of neck 04/02/2021   Back spasm 08/22/2021   Capillaritis 03/23/2019   Cataract    Cervicalgia 12/26/2021   Decreased GFR 07/09/2021   06/26/21 GFR 48     Early satiety 01/17/2021   Elevated blood pressure reading 11/23/2018   Fracture Risk Assessment Score (FRAX) indicating greater than 3% risk for hip fracture 05/24/2022   Frequent headaches    seasonal   GERD (gastroesophageal reflux disease)    Grade II diastolic dysfunction    Hiatal hernia    History of chicken pox    History of colon polyps    History of osteoporosis 05/24/2022   Hypercalcemia 07/09/2021   Hypercholesteremia    Hypoglycemia 01/17/2021   IBS (irritable bowel syndrome)    constipation    Immunization reaction 01/13/2020   Impingement syndrome of left shoulder 06/25/2020   Dr. Odis Luster [facility]    Insomnia 11/23/2018   Laryngopharyngeal reflux    Leg edema 01/13/2020   Lymphedema 01/17/2021   MGUS (monoclonal gammopathy of unknown significance)    Mild mitral regurgitation by prior echocardiogram    Neuropathy 08/22/2021   Numbness of toes 01/17/2021   OSA on CPAP    Osteopenia 05/24/2022   Osteoporosis    PONV (postoperative nausea and vomiting)    Sinus tachycardia 07/13/2020   Sleep apnea    Subconjunctival hemorrhage of left eye 01/17/2021   Late 12/2020 resolved after 2 weeks    Thoracic spondylosis without myelopathy 10/18/2021   Tinnitus    Venous insufficiency     Past Surgical History:  Procedure Laterality Date   ABDOMINAL HYSTERECTOMY     carpel tunnel surgery-left Left    July 2023   COLONOSCOPY WITH PROPOFOL N/A  03/14/2016   Procedure: COLONOSCOPY WITH PROPOFOL;  Surgeon: Wallace Cullens, MD;  Location: [facility] END[facility]COPY;  Service: Gastroenterology;  Laterality: N/A;   DILATION AND CURETTAGE OF UTERUS     TONSILLECTOMY      Prior to Admission medications   Medication Sig Start Date End Date Taking? Authorizing Provider  atorvastatin (LIPITOR) 10 MG tablet Take 1 tablet (10 mg total) by mouth daily at 6 PM. 02/02/24  Yes Dana Allan, MD  calcium-vitamin D (OSCAL WITH D) 500-200 MG-UNIT tablet Take 1 tablet by mouth.   Yes [provider]  diclofenac sodium (VOLTAREN) 1 % GEL Apply topically 4 (four) times daily. Prn   Yes [provider]  Multiple Vitamin (MULTIVITAMIN) tablet Take 1 tablet by mouth daily.   Yes [provider]  pantoprazole (PROTONIX) 40 MG tablet Take 40 mg by mouth daily with supper.   Yes [provider]  Probiotic Product (ALIGN) 4 MG CAPS    Yes [provider]  RESTASIS 0.05 % ophthalmic emulsion PLACE 1 DROP INTO BOTH EYES TWICE A DAY 03/15/17  Yes [provider]    Allergies as of 01/21/2024 - Review Complete 12/22/2023  Allergen Reaction Noted   Bactrim [sulfamethoxazole-trimethoprim] Hives 03/01/2016   Codeine Other (See Comments) 03/01/2016   Compazine [prochlorperazine edisylate] Other (See Comments) 03/01/2016   Demerol [meperidine] Other (See Comments) 03/01/2016  Indocin [indomethacin] Other (See Comments) 03/01/2016   Other  11/23/2018   Reglan [metoclopramide] Other (See Comments) 03/01/2016    Family History  Problem Relation Age of Onset   Prostate cancer Father    Cancer Father        prostate    Hypertension Father    Lung cancer Sister    Cancer Sister        lung smoker    Deep vein thrombosis Sister    Pulmonary embolism Sister    Breast cancer Maternal Aunt 6   Cancer Maternal Aunt        breast   Stroke Mother    Osteoporosis Mother    Heart disease Maternal Grandmother        CHF   AAA  (abdominal aortic aneurysm) Paternal Grandmother    Hypertension Paternal Grandmother    Cancer Paternal Grandfather        prostate    Social History   Socioeconomic History   Marital status: Married    Spouse name: Not on file   Number of children: Not on file   Years of education: Not on file   Highest education level: Not on file  Occupational History   Not on file  Tobacco Use   Smoking status: Former   Smokeless tobacco: Never   Tobacco comments:    age [age] light   Vaping Use   Vaping status: Never Used  Substance and Sexual Activity   Alcohol use: No   Drug use: No   Sexual activity: Not Currently    Birth control/protection: Surgical  Other Topics Concern   Not on file  Social History Narrative   Married, husband in house    Wears seat belt, husband owns guns, safe in relationship    No kids    Masters, previous nurse office work    No kids    Retired    Teacher, early years/pre Strain: Low Risk  (01/26/2024)   Overall Financial Resource Strain (CARDIA)    Difficulty of Paying Living Expenses: Not hard at all  Food Insecurity: No Food Insecurity (01/26/2024)   Hunger Vital Sign    Worried About Running Out of Food in the Last Year: Never true    Ran Out of Food in the Last Year: Never true  Transportation Needs: No Transportation Needs (01/26/2024)   PRAPARE - Administrator, Civil Service (Medical): No    Lack of Transportation (Non-Medical): No  Physical Activity: Sufficiently Active (01/26/2024)   Exercise Vital Sign    Days of Exercise per Week: 7 days    Minutes of Exercise per Session: 50 min  Stress: No Stress Concern Present (01/26/2024)   [employer] of Occupational Health - Occupational Stress Questionnaire    Feeling of Stress : Not at all  Social Connections: Socially Integrated (01/26/2024)   Social Connection and Isolation Panel [NHANES]    Frequency of Communication with Friends and Family: More than  three times a week    Frequency of Social Gatherings with Friends and Family: More than three times a week    Attends Religious Services: More than 4 times per year    Active Member of [employer] or Organizations: Yes    Attends Banker Meetings: More than 4 times per year    Marital Status: Married  Catering manager Violence: Not At Risk (01/26/2024)   Humiliation, Afraid, Rape, and Kick questionnaire    Fear of  Current or Ex-Partner: No    Emotionally Abused: No    Physically Abused: No    Sexually Abused: No    Review of Systems: See HPI, otherwise negative ROS  Physical Exam: BP (!) 165/78   Temp 98.2 F (36.8 C) (Temporal)   Resp 17   Ht 4' 11.02" (1.499 m)   Wt 62.2 kg   SpO2 100%   BMI 27.68 kg/m  General:   Alert, cooperative in NAD Head:  Normocephalic and atraumatic. Respiratory:  Normal work of breathing. Cardiovascular:  RRR  Impression/Plan: Carol Stone is here for cataract surgery.  Risks, benefits, limitations, and alternatives regarding cataract surgery have been reviewed with the patient.  Questions have been answered.  All parties agreeable.   Galen Manila, MD  02/17/2024, 9:54 AM

## 2024-02-17 NOTE — Transfer of Care (Signed)
 Immediate Anesthesia Transfer of Care Note  Patient: Carol Stone  Procedure(s) Performed: CATARACT EXTRACTION PHACO AND INTRAOCULAR LENS PLACEMENT (IOC) LEFT (Left)  Patient Location: PACU  Anesthesia Type: MAC  Level of Consciousness: awake, alert  and patient cooperative  Airway and Oxygen Therapy: Patient Spontanous Breathing and Patient connected to supplemental oxygen  Post-op Assessment: Post-op Vital signs reviewed, Patient's Cardiovascular Status Stable, Respiratory Function Stable, Patent Airway and No signs of Nausea or vomiting  Post-op Vital Signs: Reviewed and stable  Complications: No notable events documented.

## 2024-02-17 NOTE — Anesthesia Postprocedure Evaluation (Signed)
 Anesthesia Post Note  Patient: Carol Stone  Procedure(s) Performed: CATARACT EXTRACTION PHACO AND INTRAOCULAR LENS PLACEMENT (IOC) LEFT (Left)  Patient location during evaluation: PACU Anesthesia Type: MAC Level of consciousness: awake and alert Pain management: pain level controlled Vital Signs Assessment: post-procedure vital signs reviewed and stable Respiratory status: spontaneous breathing, nonlabored ventilation, respiratory function stable and patient connected to nasal cannula oxygen Cardiovascular status: stable and blood pressure returned to baseline Postop Assessment: no apparent nausea or vomiting Anesthetic complications: no   No notable events documented.   Last Vitals:  Vitals:   02/17/24 1020 02/17/24 1024  BP: 133/64 132/65  Pulse: 89 86  Resp: 16 18  Temp:  (!) 36.1 C  SpO2: 98% 96%    Last Pain:  Vitals:   02/17/24 1024  TempSrc:   PainSc: 0-No pain                 Catelyn Friel C Pierrette Scheu

## 2024-02-18 ENCOUNTER — Encounter: Payer: Self-pay | Admitting: Ophthalmology

## 2024-02-18 DIAGNOSIS — H2511 Age-related nuclear cataract, right eye: Secondary | ICD-10-CM | POA: Diagnosis not present

## 2024-03-01 NOTE — Discharge Instructions (Signed)

## 2024-03-02 ENCOUNTER — Other Ambulatory Visit: Payer: Self-pay

## 2024-03-02 ENCOUNTER — Encounter
Admission: RE | Disposition: A | Discharge status: Home / Self Care | Payer: Self-pay | Source: Home / Self Care | Attending: Ophthalmology

## 2024-03-02 ENCOUNTER — Ambulatory Visit: Payer: Self-pay | Admitting: Anesthesiology

## 2024-03-02 ENCOUNTER — Encounter: Payer: Self-pay | Admitting: Ophthalmology

## 2024-03-02 ENCOUNTER — Ambulatory Visit
Admission: RE | Admit: 2024-03-02 | Discharge: 2024-03-02 | Disposition: A | Discharge status: Home / Self Care | Payer: Medicare PPO | Attending: Ophthalmology | Admitting: Ophthalmology

## 2024-03-02 DIAGNOSIS — Z87891 Personal history of nicotine dependence: Secondary | ICD-10-CM | POA: Insufficient documentation

## 2024-03-02 DIAGNOSIS — G4733 Obstructive sleep apnea (adult) (pediatric): Secondary | ICD-10-CM | POA: Diagnosis not present

## 2024-03-02 DIAGNOSIS — H2511 Age-related nuclear cataract, right eye: Secondary | ICD-10-CM | POA: Diagnosis not present

## 2024-03-02 DIAGNOSIS — G4730 Sleep apnea, unspecified: Secondary | ICD-10-CM | POA: Diagnosis not present

## 2024-03-02 DIAGNOSIS — H269 Unspecified cataract: Secondary | ICD-10-CM | POA: Diagnosis not present

## 2024-03-02 HISTORY — PX: CATARACT EXTRACTION W/PHACO: SHX586

## 2024-03-02 SURGERY — PHACOEMULSIFICATION, CATARACT, WITH IOL INSERTION
Anesthesia: Monitor Anesthesia Care | Site: Eye | Laterality: Right

## 2024-03-02 MED ORDER — ONDANSETRON HCL 4 MG/2ML IJ SOLN
INTRAMUSCULAR | Status: DC | PRN
Start: 2024-03-02 — End: 2024-03-02
  Administered 2024-03-02: 4 mg via INTRAVENOUS

## 2024-03-02 MED ORDER — BRIMONIDINE TARTRATE-TIMOLOL 0.2-0.5 % OP SOLN
OPHTHALMIC | Status: DC | PRN
  Administered 2024-03-02: 1 [drp] via OPHTHALMIC

## 2024-03-02 MED ORDER — MOXIFLOXACIN HCL 0.5 % OP SOLN
OPHTHALMIC | Status: DC | PRN
  Administered 2024-03-02: .2 mL via OPHTHALMIC

## 2024-03-02 MED ORDER — ONDANSETRON HCL 4 MG/2ML IJ SOLN
INTRAMUSCULAR | Status: AC
Start: 2024-03-02 — End: ?
  Filled 2024-03-02: qty 2

## 2024-03-02 MED ORDER — SIGHTPATH DOSE#1 BSS IO SOLN
INTRAOCULAR | Status: DC | PRN
  Administered 2024-03-02: 44 mL via OPHTHALMIC

## 2024-03-02 MED ORDER — FENTANYL CITRATE (PF) 100 MCG/2ML IJ SOLN
INTRAMUSCULAR | Status: DC | PRN
  Administered 2024-03-02: 50 ug via INTRAVENOUS

## 2024-03-02 MED ORDER — TETRACAINE HCL 0.5 % OP SOLN
OPHTHALMIC | Status: AC
  Filled 2024-03-02: qty 4

## 2024-03-02 MED ORDER — MIDAZOLAM HCL 2 MG/2ML IJ SOLN
INTRAMUSCULAR | Status: AC
  Filled 2024-03-02: qty 2

## 2024-03-02 MED ORDER — SIGHTPATH DOSE#1 BSS IO SOLN
INTRAOCULAR | Status: DC | PRN
  Administered 2024-03-02: 15 mL via INTRAOCULAR

## 2024-03-02 MED ORDER — SIGHTPATH DOSE#1 NA CHONDROIT SULF-NA HYALURON 40-17 MG/ML IO SOLN
INTRAOCULAR | Status: DC | PRN
  Administered 2024-03-02: 1 mL via INTRAOCULAR

## 2024-03-02 MED ORDER — ARMC OPHTHALMIC DILATING DROPS
OPHTHALMIC | Status: AC
  Filled 2024-03-02: qty 0.5

## 2024-03-02 MED ORDER — MIDAZOLAM HCL 2 MG/2ML IJ SOLN
INTRAMUSCULAR | Status: DC | PRN
  Administered 2024-03-02: 2 mg via INTRAVENOUS

## 2024-03-02 MED ORDER — ARMC OPHTHALMIC DILATING DROPS
1.0000 | OPHTHALMIC | Status: DC | PRN
Start: 2024-03-02 — End: 2024-03-02
  Administered 2024-03-02 (×3): 1 via OPHTHALMIC

## 2024-03-02 MED ORDER — TETRACAINE HCL 0.5 % OP SOLN
1.0000 [drp] | OPHTHALMIC | Status: DC | PRN
  Administered 2024-03-02 (×3): 1 [drp] via OPHTHALMIC

## 2024-03-02 MED ORDER — LIDOCAINE HCL (PF) 2 % IJ SOLN
INTRAOCULAR | Status: DC | PRN
  Administered 2024-03-02: 2 mL

## 2024-03-02 MED ORDER — FENTANYL CITRATE (PF) 100 MCG/2ML IJ SOLN
INTRAMUSCULAR | Status: AC
  Filled 2024-03-02: qty 2

## 2024-03-02 SURGICAL SUPPLY — 12 items
CATARACT SUITE SIGHTPATH (MISCELLANEOUS) ×1 IMPLANT
CYSTOTOME ANG REV CUT SHRT 25G (CUTTER) ×1 IMPLANT
CYSTOTOME ANGL RVRS SHRT 25G (CUTTER) ×1 IMPLANT
FEE CATARACT SUITE SIGHTPATH (MISCELLANEOUS) ×1 IMPLANT
GLOVE BIOGEL PI IND STRL 8 (GLOVE) ×1 IMPLANT
GLOVE SURG LX STRL 8.0 MICRO (GLOVE) ×1 IMPLANT
GLOVE SURG PROTEXIS BL SZ6.5 (GLOVE) ×1 IMPLANT
GLOVE SURG SYN 6.5 PF PI BL (GLOVE) ×1 IMPLANT
LENS IOL TECNIS EYHANCE 21.5 (Intraocular Lens) IMPLANT
NDL FILTER BLUNT 18X1 1/2 (NEEDLE) ×1 IMPLANT
NEEDLE FILTER BLUNT 18X1 1/2 (NEEDLE) ×1 IMPLANT
SYR 3ML LL SCALE MARK (SYRINGE) ×1 IMPLANT

## 2024-03-02 NOTE — Anesthesia Postprocedure Evaluation (Signed)
 Anesthesia Post Note  Patient: [name]  Procedure(s) Performed: CATARACT EXTRACTION PHACO AND INTRAOCULAR LENS PLACEMENT (IOC) RIGHT 5.32 00:34.0 (Right: Eye)  Patient location during evaluation: PACU Anesthesia Type: MAC Level of consciousness: awake and alert Pain management: pain level controlled Vital Signs Assessment: post-procedure vital signs reviewed and stable Respiratory status: spontaneous breathing, nonlabored ventilation, respiratory function stable and patient connected to nasal cannula oxygen Cardiovascular status: stable and blood pressure returned to baseline Postop Assessment: no apparent nausea or vomiting Anesthetic complications: no   No notable events documented.   Last Vitals:  Vitals:   03/02/24 1039 03/02/24 1043  BP: (!) 149/74 132/72  Pulse: 79 81  Resp: 14 (!) 23  Temp: (!) 36.4 C (!) 36.4 C  SpO2: 97% 97%    Last Pain:  Vitals:   03/02/24 1043  TempSrc:   PainSc: 0-No pain                 Aero Drummonds C Shrihaan Porzio

## 2024-03-02 NOTE — Op Note (Signed)
 PREOPERATIVE DIAGNOSIS:  Nuclear sclerotic cataract of the right eye.   POSTOPERATIVE DIAGNOSIS:  Cataract   OPERATIVE PROCEDURE:ORPROCALL@   SURGEON:  Galen Manila, MD.   ANESTHESIA:  Anesthesiologist: Marisue Humble, MD CRNA: Barbette Hair, CRNA  1.      Managed anesthesia care. 2.      0.65ml of Shugarcaine was instilled in the eye following the paracentesis.   COMPLICATIONS:  None.   TECHNIQUE:   Stop and chop   DESCRIPTION OF PROCEDURE:  The patient was examined and consented in the preoperative holding area where the aforementioned topical anesthesia was applied to the right eye and then brought back to the Operating Room where the right eye was prepped and draped in the usual sterile ophthalmic fashion and a lid speculum was placed. A paracentesis was created with the side port blade and the anterior chamber was filled with viscoelastic. A near clear corneal incision was performed with the steel keratome. A continuous curvilinear capsulorrhexis was performed with a cystotome followed by the capsulorrhexis forceps. Hydrodissection and hydrodelineation were carried out with BSS on a blunt cannula. The lens was removed in a stop and chop  technique and the remaining cortical material was removed with the irrigation-aspiration handpiece. The capsular bag was inflated with viscoelastic and the Technis [id number]  lens was placed in the capsular bag without complication. The remaining viscoelastic was removed from the eye with the irrigation-aspiration handpiece. The wounds were hydrated. The anterior chamber was flushed with BSS and the eye was inflated to physiologic pressure. 0.46ml of Vigamox was placed in the anterior chamber. The wounds were found to be water tight. The eye was dressed with Combigan. The patient was given protective glasses to wear throughout the day and a shield with which to sleep tonight. The patient was also given drops with which to begin a drop regimen today and will  follow-up with me in one day. Implant Name Type Inv. Item Serial No. Manufacturer Lot No. LRB No. Used Action  LENS IOL TECNIS EYHANCE 21.5 - [id number] Intraocular Lens LENS IOL TECNIS EYHANCE 21.5 [id number] [employer]  Right 1 Implanted   Procedure(s): CATARACT EXTRACTION PHACO AND INTRAOCULAR LENS PLACEMENT (IOC) RIGHT 5.32 00:34.0 (Right)  Electronically signed: Galen Manila 03/02/2024 10:39 AM

## 2024-03-02 NOTE — Transfer of Care (Signed)
 Immediate Anesthesia Transfer of Care Note  Patient: Carol Stone  Procedure(s) Performed: CATARACT EXTRACTION PHACO AND INTRAOCULAR LENS PLACEMENT (IOC) RIGHT 5.32 00:34.0 (Right: Eye)  Patient Location: PACU  Anesthesia Type: MAC  Level of Consciousness: awake, alert  and patient cooperative  Airway and Oxygen Therapy: Patient Spontanous Breathing and Patient connected to supplemental oxygen  Post-op Assessment: Post-op Vital signs reviewed, Patient's Cardiovascular Status Stable, Respiratory Function Stable, Patent Airway and No signs of Nausea or vomiting  Post-op Vital Signs: Reviewed and stable  Complications: No notable events documented.

## 2024-03-02 NOTE — Anesthesia Preprocedure Evaluation (Signed)
 Anesthesia Evaluation  Patient identified by MRN, date of birth, ID band Patient awake    Reviewed: Allergy & Precautions, H&P , NPO status , Patient's Chart, lab work & pertinent test results  History of Anesthesia Complications (+) PONV and history of anesthetic complications  Airway Mallampati: III  TM Distance: >3 FB     Dental   Very tiny chip noted upper incisor Upper bridge permanent:  :   Pulmonary neg pulmonary ROS, sleep apnea , former smoker          Cardiovascular negative cardio ROS   11-21-23  1. Left ventricular ejection fraction, by estimation, is 60 to 65%. The  left ventricle has normal function. The left ventricle has no regional  wall motion abnormalities. There is mild left ventricular hypertrophy.  Left ventricular diastolic parameters  are consistent with Grade II diastolic dysfunction (pseudonormalization).   2. Right ventricular systolic function is normal. The right ventricular  size is normal.   3. Left atrial size was mildly dilated.   4. The mitral valve is degenerative. Mild mitral valve regurgitation. The  mean mitral valve gradient is 3.5 mmHg. Moderate to severe mitral annular  calcification.   5. The aortic valve is tricuspid. Aortic valve regurgitation is trivial.   6. The inferior vena cava is normal in size with greater than 50%  respiratory variability, suggesting right atrial pressure of 3 mmHg.     Neuro/Psych  Headaches  Neuromuscular disease negative neurological ROS  negative psych ROS   GI/Hepatic negative GI ROS, Neg liver ROS, hiatal hernia,GERD  ,,  Endo/Other  negative endocrine ROS    Renal/GU Renal diseasenegative Renal ROS  negative genitourinary   Musculoskeletal negative musculoskeletal ROS (+) Arthritis ,    Abdominal   Peds negative pediatric ROS (+)  Hematology negative hematology ROS (+)   Anesthesia Other Findings Previous cataract surgery 02-17-24  Dr. Juel Burrow   Hypercholesteremia  Arthritis Osteoporosis  GERD (gastroesophageal reflux disease) Cataract  Sleep apnea Venous insufficiency  Tinnitus OSA on CPAP  IBS (irritable bowel syndrome) Frequent headaches Allergy History of colon polyps Elevated blood pressure reading Decreased GFR Hypoglycemia  Lymphedema Numbness of toes Hypercalcemia Immunization reaction  Early satiety Insomnia  Subconjunctival hemorrhage of left eye Sinus tachycardia  Capillaritis Neuropathy  Arthritis of neck Impingement syndrome of left shoulder  Arthritis of lumbar spine Osteopenia  Thoracic spondylosis without myelopathy Back spasm  Cervicalgia Fracture Risk Assessment Score (FRAX) indicating greater than 3% risk for hip fracture History of osteoporosis Leg edema PONV (postoperative nausea and vomiting) MGUS (monoclonal gammopathy of unknown significance) Hiatal hernia Grade II diastolic dysfunction Mild mitral regurgitation by prior echocardiogram Laryngopharyngeal reflux Aortic calcification (HCC)    Reproductive/Obstetrics negative OB ROS                              Anesthesia Physical Anesthesia Plan  ASA: 3  Anesthesia Plan: MAC   Post-op Pain Management:    Induction: Intravenous  PONV Risk Score and Plan:   Airway Management Planned: Natural Airway and Nasal Cannula  Additional Equipment:   Intra-op Plan:   Post-operative Plan:   Informed Consent: I have reviewed the patients History and Physical, chart, labs and discussed the procedure including the risks, benefits and alternatives for the proposed anesthesia with the patient or authorized representative who has indicated his/her understanding and acceptance.     Dental Advisory Given  Plan Discussed with: Anesthesiologist, CRNA and Surgeon  Anesthesia Plan Comments: (Patient consented for risks of anesthesia including but not limited to:  - adverse reactions to medications -  damage to eyes, teeth, lips or other oral mucosa - nerve damage due to positioning  - sore throat or hoarseness - Damage to heart, brain, nerves, lungs, other parts of body or loss of life  Patient voiced understanding and assent.)         Anesthesia Quick Evaluation

## 2024-03-02 NOTE — H&P (Signed)
 [facility]   Primary Care Physician:  Dana Allan, MD Ophthalmologist: Dr.Kamori Barbier  Pre-Procedure History & Physical: HPI:  Carol Stone is a 78 y.o. female here for cataract surgery.   Past Medical History:  Diagnosis Date   Allergy    Aortic calcification (HCC)    Arthritis    Arthritis of lumbar spine 04/02/2021   Arthritis of neck 04/02/2021   Back spasm 08/22/2021   Capillaritis 03/23/2019   Cataract    Cervicalgia 12/26/2021   Decreased GFR 07/09/2021   06/26/21 GFR 48     Early satiety 01/17/2021   Elevated blood pressure reading 11/23/2018   Fracture Risk Assessment Score (FRAX) indicating greater than 3% risk for hip fracture 05/24/2022   Frequent headaches    seasonal   GERD (gastroesophageal reflux disease)    Grade II diastolic dysfunction    Hiatal hernia    History of chicken pox    History of colon polyps    History of osteoporosis 05/24/2022   Hypercalcemia 07/09/2021   Hypercholesteremia    Hypoglycemia 01/17/2021   IBS (irritable bowel syndrome)    constipation    Immunization reaction 01/13/2020   Impingement syndrome of left shoulder 06/25/2020   Dr. Odis Luster [facility]/20 [facility]    Insomnia 11/23/2018   Laryngopharyngeal reflux    Leg edema 01/13/2020   Lymphedema 01/17/2021   MGUS (monoclonal gammopathy of unknown significance)    Mild mitral regurgitation by prior echocardiogram    Neuropathy 08/22/2021   Numbness of toes 01/17/2021   OSA on CPAP    Osteopenia 05/24/2022   Osteoporosis    PONV (postoperative nausea and vomiting)    Sinus tachycardia 07/13/2020   Sleep apnea    Subconjunctival hemorrhage of left eye 01/17/2021   Late 12/2020 resolved after 2 weeks    Thoracic spondylosis without myelopathy 10/18/2021   Tinnitus    Venous insufficiency     Past Surgical History:  Procedure Laterality Date   ABDOMINAL HYSTERECTOMY     carpel tunnel surgery-left Left    July 2023   CATARACT EXTRACTION W/PHACO Left  02/17/2024   Procedure: CATARACT EXTRACTION PHACO AND INTRAOCULAR LENS PLACEMENT (IOC) LEFT;  Surgeon: Galen Manila, MD;  Location: [facility];  Service: Ophthalmology;  Laterality: Left;  5.07 0:36.3   COLONOSCOPY WITH PROPOFOL N/A 03/14/2016   Procedure: COLONOSCOPY WITH PROPOFOL;  Surgeon: Wallace Cullens, MD;  Location[facility];  Service: Gastroenterology;  Laterality: N/A;   DILATION AND CURETTAGE OF UTERUS     TONSILLECTOMY      Prior to Admission medications   Medication Sig Start Date End Date Taking? Authorizing Provider  atorvastatin (LIPITOR) 10 MG tablet Take 1 tablet (10 mg total) by mouth daily at 6 PM. 02/02/24  Yes Dana Allan, MD  calcium-vitamin D (OSCAL WITH D) 500-200 MG-UNIT tablet Take 1 tablet by mouth.   Yes [provider]  diclofenac sodium (VOLTAREN) 1 % GEL Apply topically 4 (four) times daily. Prn   Yes [provider]  Multiple Vitamin (MULTIVITAMIN) tablet Take 1 tablet by mouth daily.   Yes [provider]  pantoprazole (PROTONIX) 40 MG tablet Take 40 mg by mouth daily with supper.   Yes [provider]  Probiotic Product (ALIGN) 4 MG CAPS    Yes [provider]  RESTASIS 0.05 % ophthalmic emulsion PLACE 1 DROP INTO BOTH EYES TWICE A DAY 03/15/17  Yes [provider]    Allergies as of 01/21/2024 - Review  Complete 12/22/2023  Allergen Reaction Noted   Bactrim [sulfamethoxazole-trimethoprim] Hives 03/01/2016   Codeine Other (See Comments) 03/01/2016   Compazine [prochlorperazine edisylate] Other (See Comments) 03/01/2016   Demerol [meperidine] Other (See Comments) 03/01/2016   Indocin [indomethacin] Other (See Comments) 03/01/2016   Other  11/23/2018   Reglan [metoclopramide] Other (See Comments) 03/01/2016    Family History  Problem Relation Age of Onset   Prostate cancer Father    Cancer Father        prostate    Hypertension Father    Lung cancer Sister    Cancer Sister         lung smoker    Deep vein thrombosis Sister    Pulmonary embolism Sister    Breast cancer Maternal Aunt 92   Cancer Maternal Aunt        breast   Stroke Mother    Osteoporosis Mother    Heart disease Maternal Grandmother        CHF   AAA (abdominal aortic aneurysm) Paternal Grandmother    Hypertension Paternal Grandmother    Cancer Paternal Grandfather        prostate    Social History   Socioeconomic History   Marital status: Married    Spouse name: Not on file   Number of children: Not on file   Years of education: Not on file   Highest education level: Not on file  Occupational History   Not on file  Tobacco Use   Smoking status: Former   Smokeless tobacco: Never   Tobacco comments:    age [age] light   Vaping Use   Vaping status: Never Used  Substance and Sexual Activity   Alcohol use: No   Drug use: No   Sexual activity: Not Currently    Birth control/protection: Surgical  Other Topics Concern   Not on file  Social History Narrative   Married, husband in house    Wears seat belt, husband owns guns, safe in relationship    No kids    Masters, previous nurse office work    No kids    Retired    Teacher, early years/pre Strain: Low Risk  (01/26/2024)   Overall Financial Resource Strain (CARDIA)    Difficulty of Paying Living Expenses: Not hard at all  Food Insecurity: No Food Insecurity (01/26/2024)   Hunger Vital Sign    Worried About Running Out of Food in the Last Year: Never true    Ran Out of Food in the Last Year: Never true  Transportation Needs: No Transportation Needs (01/26/2024)   PRAPARE - Administrator, Civil Service (Medical): No    Lack of Transportation (Non-Medical): No  Physical Activity: Sufficiently Active (01/26/2024)   Exercise Vital Sign    Days of Exercise per Week: 7 days    Minutes of Exercise per Session: 50 min  Stress: No Stress Concern Present (01/26/2024)   [employer] of Occupational  Health - Occupational Stress Questionnaire    Feeling of Stress : Not at all  Social Connections: Socially Integrated (01/26/2024)   Social Connection and Isolation Panel [NHANES]    Frequency of Communication with Friends and Family: More than three times a week    Frequency of Social Gatherings with Friends and Family: More than three times a week    Attends Religious Services: More than 4 times per year    Active Member of [employer] or Organizations: Yes  Attends Banker Meetings: More than 4 times per year    Marital Status: Married  Catering manager Violence: Not At Risk (01/26/2024)   Humiliation, Afraid, Rape, and Kick questionnaire    Fear of Current or Ex-Partner: No    Emotionally Abused: No    Physically Abused: No    Sexually Abused: No    Review of Systems: See HPI, otherwise negative ROS  Physical Exam: BP (!) 156/84   Pulse 93   Temp (!) 97.4 F (36.3 C) (Temporal)   Resp 12   Ht 4' 11.02" (1.499 m)   Wt 62.1 kg   SpO2 100%   BMI 27.65 kg/m  General:   Alert, cooperative in NAD Head:  Normocephalic and atraumatic. Respiratory:  Normal work of breathing. Cardiovascular:  RRR  Impression/Plan: Carol Stone is here for cataract surgery.  Risks, benefits, limitations, and alternatives regarding cataract surgery have been reviewed with the patient.  Questions have been answered.  All parties agreeable.   Galen Manila, MD  03/02/2024, 10:17 AM

## 2024-03-15 ENCOUNTER — Encounter: Payer: Self-pay | Admitting: Family Medicine

## 2024-03-15 ENCOUNTER — Telehealth: Payer: Self-pay

## 2024-03-15 ENCOUNTER — Ambulatory Visit: Admitting: Family Medicine

## 2024-03-15 VITALS — BP 136/76 | HR 92 | Temp 98.0°F | Resp 20 | Ht 59.02 in | Wt 137.0 lb

## 2024-03-15 DIAGNOSIS — K449 Diaphragmatic hernia without obstruction or gangrene: Secondary | ICD-10-CM

## 2024-03-15 DIAGNOSIS — I872 Venous insufficiency (chronic) (peripheral): Secondary | ICD-10-CM

## 2024-03-15 LAB — COMPREHENSIVE METABOLIC PANEL WITH GFR
ALT: 15 U/L (ref 0–35)
AST: 15 U/L (ref 0–37)
Albumin: 4.4 g/dL (ref 3.5–5.2)
Alkaline Phosphatase: 56 U/L (ref 39–117)
BUN: 25 mg/dL — ABNORMAL HIGH (ref 6–23)
CO2: 26 meq/L (ref 19–32)
Calcium: 9.2 mg/dL (ref 8.4–10.5)
Chloride: 106 meq/L (ref 96–112)
Creatinine, Ser: 0.91 mg/dL (ref 0.40–1.20)
GFR: 60.44 mL/min (ref >60.00)
Glucose, Bld: 77 mg/dL (ref 70–99)
Potassium: 4.4 meq/L (ref 3.5–5.1)
Sodium: 141 meq/L (ref 135–145)
Total Bilirubin: 0.5 mg/dL (ref 0.2–1.2)
Total Protein: 7.2 g/dL (ref 6.0–8.3)

## 2024-03-15 MED ORDER — FUROSEMIDE 20 MG PO TABS: 10.0000 mg | ORAL_TABLET | Freq: Every day | ORAL | 3 refills | Status: AC | PRN

## 2024-03-15 NOTE — Telephone Encounter (Signed)
 Left message to call the office back regarding the lab results below. Okay for E2C2 to give lab results to Patient.

## 2024-03-15 NOTE — Telephone Encounter (Signed)
 Copied from CRM [id number]. Topic: Clinical - Lab/Test Results >> Mar 15, 2024  1:52 PM Carol Stone wrote: Reason for CRM: The patient was returning a call regarding her lab work. I read the note to her verbatim and she expressed understanding.

## 2024-03-15 NOTE — Patient Instructions (Addendum)
 It was a pleasure meeting you today. Thank you for allowing me to take part in your health care.  Our goals for today as we discussed include:  Refills sent for requested medication  We will get some labs today.  If they are abnormal or we need to do something about them, I will call you.  If they are normal, I will send you a message on MyChart (if it is active) or a letter in the mail.  If you don't hear from [place] in 2 weeks, please call the office at the number below.   MMR titre shows no immunity to [place] Measles.  There is immunity to Mumps and [place], measles.  You can go to the Health Department for booster of MMR.  This is a list of the screening recommended for you and due dates:  Health Maintenance  Topic Date Due   COVID-19 Vaccine (13 - Mixed Product risk 2024-25 season) 02/16/2024   Flu Shot  07/09/2024   Mammogram  10/02/2024   Medicare Annual Wellness Visit  01/25/2025   Colon Cancer Screening  02/27/2026   DTaP/Tdap/Td vaccine (4 - Td or Tdap) 08/16/2026   DEXA scan (bone density measurement)  Completed   Hepatitis C Screening  Completed   Zoster (Shingles) Vaccine  Completed   HPV Vaccine  Aged Out   Pneumonia Vaccine  Discontinued      If you have any questions or concerns, please do not hesitate to call the office at [phone].  I look forward to our next visit and until then take care and stay safe.  Regards,   Dana Allan, MD   [facility]

## 2024-03-15 NOTE — Telephone Encounter (Signed)
-----   Message from Dana Allan sent at 03/15/2024 12:40 PM EDT ----- Blood work acceptable.  Increase water intake to avoid dehydration.

## 2024-03-15 NOTE — Progress Notes (Signed)
 SUBJECTIVE:   Chief Complaint  Patient presents with   Medication Refill   HPI Presents to clinic for medication refill  Discussed the use of AI scribe software for clinical note transcription with the patient, who gave verbal consent to proceed.  History of Present Illness Carol Stone is a 78 year old female who presents for a medication refill.  She is here for a medication refill of Lasix , which she uses for swelling. She is currently taking 10 mg as needed, rather than the previously prescribed 40 mg, and cuts the 10 mg tablets into quarters. She does not take it daily. She uses compression stockings during the winter but not in hot weather. The swelling is not as severe as before and is more prominent in her arms. No chest pain or shortness of breath.  She experiences increased coughing and occasional choking, which she associates with her gastrointestinal issues. She has a history of a sliding hiatal hernia. Bending over sometimes causes pain in the sternal area and pressure in the throat. These symptoms have been present for the past five to six months.  She reports soreness in her left hip over the past few days.  She inquires about her immunity to measles, as a previous test indicated she was not immune. She is concerned due to an upsurge in measles cases and her exposure to people who are around children. She recalls that the test might have been ordered by Dr. Sherrlyn Stone, but she is unsure of the exact timing or rationale for the test.  She confirms that she is still taking Protonix and does not require refills for her cholesterol medication. She walks two to three miles a day.    PERTINENT PMH / PSH: As above  OBJECTIVE:  BP 136/76   Pulse 92   Temp 98 F (36.7 C)   Resp 20   Ht 4' 11.02" (1.499 m)   Wt 137 lb (62.1 kg)   SpO2 98%   BMI 27.65 kg/m    Physical Exam Vitals reviewed.  Constitutional:      General: She is not in acute distress.    Appearance:  Normal appearance. She is not ill-appearing, toxic-appearing or diaphoretic.  Eyes:     General:        Right eye: No discharge.        Left eye: No discharge.     Conjunctiva/sclera: Conjunctivae normal.  Cardiovascular:     Rate and Rhythm: Normal rate and regular rhythm.     Heart sounds: Normal heart sounds.  Pulmonary:     Effort: Pulmonary effort is normal.     Breath sounds: Normal breath sounds.  Abdominal:     General: Bowel sounds are normal. There is no distension.     Palpations: Abdomen is soft.     Tenderness: There is no abdominal tenderness. There is no right CVA tenderness, left CVA tenderness or guarding.  Musculoskeletal:        General: Swelling (trace LUA) present. Normal range of motion.     Right lower leg: Edema (trace) present.     Left lower leg: Edema (trace) present.  Skin:    General: Skin is warm and dry.  Neurological:     General: No focal deficit present.     Mental Status: She is alert and oriented to person, place, and time. Mental status is at baseline.  Psychiatric:        Mood and Affect: Mood normal.  Behavior: Behavior normal.        Thought Content: Thought content normal.        Judgment: Judgment normal.           03/15/2024    8:17 AM 01/26/2024    1:51 PM 07/24/2023    8:21 AM 01/23/2023    2:13 PM 01/14/2023    2:34 PM  Depression screen PHQ 2/9  Decreased Interest 0 0 0 0 0  Down, Depressed, Hopeless 0 0 0 0 0  PHQ - 2 Score 0 0 0 0 0  Altered sleeping 0 0 0 0 0  Tired, decreased energy 0 0 0 0 0  Change in appetite 0 0 0 0 0  Feeling bad or failure about yourself  0 0 0 0 0  Trouble concentrating 0 0 0 0 0  Moving slowly or fidgety/restless 0 0 0 0 0  Suicidal thoughts 0 0 0 0 0  PHQ-9 Score 0 0 0 0 0  Difficult doing work/chores Not difficult at all Not difficult at all Not difficult at all Not difficult at all Not difficult at all      03/15/2024    8:17 AM 07/24/2023    8:22 AM 01/14/2023    2:34 PM 12/18/2022     8:15 AM  GAD 7 : Generalized Anxiety Score  Nervous, Anxious, on Edge 0 0 0 0  Control/stop worrying 0 0 0 0  Worry too much - different things 0 0 0 0  Trouble relaxing 0 0 0 0  Restless 0 0 0 0  Easily annoyed or irritable 0 0 0 0  Afraid - awful might happen 0 0 0 0  Total GAD 7 Score 0 0 0 0  Anxiety Difficulty Not difficult at all Not difficult at all Not difficult at all Not difficult at all    ASSESSMENT/PLAN:  Chronic venous insufficiency of lower extremity Assessment & Plan: Chronic peripheral edema managed with Lasix . Swelling more prominent in arms. Kidney function monitoring required due to diuretic use. - Refill Lasix  10 mg as needed. - Order blood work to assess kidney function.  Orders: -     Comprehensive metabolic panel with GFR -     Furosemide ; Take 0.5 tablets (10 mg total) by mouth daily as needed.  Dispense: 90 tablet; Refill: 3  Sliding hiatal hernia Assessment & Plan: No previous surgical intervention discussed. - Patient would like to discuss with her Gi provider, Dr. Corky Stone for evaluation and potential dilation.     PDMP reviewed  Return if symptoms worsen or fail to improve, for PCP.  Carol Gaw, MD

## 2024-03-22 ENCOUNTER — Encounter: Payer: Self-pay | Admitting: Family Medicine

## 2024-03-25 NOTE — Telephone Encounter (Signed)
 Called pt and she stated that she is doing good. She agreed that is must have been the vaccine.

## 2024-03-26 ENCOUNTER — Encounter: Payer: Self-pay | Admitting: Family Medicine

## 2024-03-28 ENCOUNTER — Encounter: Payer: Self-pay | Admitting: Family Medicine

## 2024-03-28 NOTE — Assessment & Plan Note (Signed)
 No previous surgical intervention discussed. - Patient would like to discuss with her Gi provider, Dr. Corky Diener for evaluation and potential dilation.

## 2024-03-28 NOTE — Assessment & Plan Note (Deleted)
 Chronic peripheral edema managed with Lasix . Swelling more prominent in arms. Kidney function monitoring required due to diuretic use. - Refill Lasix  10 mg as needed. - Order blood work to assess kidney function.

## 2024-03-28 NOTE — Assessment & Plan Note (Signed)
 Chronic peripheral edema managed with Lasix . Swelling more prominent in arms. Kidney function monitoring required due to diuretic use. - Refill Lasix  10 mg as needed. - Order blood work to assess kidney function.

## 2024-05-10 DIAGNOSIS — L820 Inflamed seborrheic keratosis: Secondary | ICD-10-CM | POA: Diagnosis not present

## 2024-05-10 DIAGNOSIS — L538 Other specified erythematous conditions: Secondary | ICD-10-CM | POA: Diagnosis not present

## 2024-05-31 DIAGNOSIS — E8809 Other disorders of plasma-protein metabolism, not elsewhere classified: Secondary | ICD-10-CM | POA: Diagnosis not present

## 2024-05-31 DIAGNOSIS — D472 Monoclonal gammopathy: Secondary | ICD-10-CM | POA: Diagnosis not present

## 2024-06-07 ENCOUNTER — Telehealth: Payer: Self-pay

## 2024-06-07 DIAGNOSIS — M810 Age-related osteoporosis without current pathological fracture: Secondary | ICD-10-CM

## 2024-06-07 NOTE — Telephone Encounter (Signed)
 Prolia  VOB initiated via [email or web address]  Next Prolia  inj DUE: 07/08/24

## 2024-06-08 ENCOUNTER — Other Ambulatory Visit (HOSPITAL_COMMUNITY): Payer: Self-pay

## 2024-06-08 NOTE — Telephone Encounter (Signed)
 Pt ready for scheduling for PROLIA  on or after : 07/08/24  Option# 1: Buy/Bill (Office supplied medication)  Out-of-pocket cost due at time of clinic visit: $357  Number of injection/visits approved: 2  Primary: [employer] Prolia  co-insurance: 20% Admin fee co-insurance: 20%  Secondary: --- Prolia  co-insurance:  Admin fee co-insurance:   Medical Benefit Details: Date Benefits were checked: 06/07/24 Deductible: NO/ Coinsurance: 20%/ Admin Fee: 20%  Prior Auth: APPROVED PA# [id number] Expiration Date: 06/08/22-12/08/24   # of doses approved: 2 ----------------------------------------------------------------------- Option# 2- Med Obtained from pharmacy:  Pharmacy benefit: Copay $60 (Paid to pharmacy) Admin Fee: 20% approximately $25 (Pay at clinic)  Prior Auth: N/A PA# Expiration Date:   # of doses approved:   If patient wants fill through the pharmacy benefit please send prescription to: [facility], and include estimated need by date in rx notes. Pharmacy will ship medication directly to the office.  Patient NOT eligible for Prolia  Copay Card. Copay Card can make patient's cost as little as $25. Link to apply: [email or web address]://[email or web address]  ** This summary of benefits is an estimation of the patient's out-of-pocket cost. Exact cost may very based on individual plan coverage.

## 2024-06-08 NOTE — Telephone Encounter (Signed)
 SABRA

## 2024-06-14 NOTE — Telephone Encounter (Unsigned)
 Copied from CRM [id number]. Topic: Clinical - Medical Advice >> Jun 14, 2024  4:25 PM Viola F wrote: Reason for CRM: Patient request call back from [facility] at [phone] regarding Prolia  injection.

## 2024-06-16 MED ORDER — DENOSUMAB 60 MG/ML ~~LOC~~ SOSY: 60.0000 mg | PREFILLED_SYRINGE | Freq: Once | SUBCUTANEOUS | 0 refills | Status: DC

## 2024-06-16 NOTE — Addendum Note (Signed)
 Addended by: BRIEN SHARENE RAMAN on: 06/16/2024 03:56 PM   Modules accepted: Orders

## 2024-06-17 NOTE — Telephone Encounter (Signed)
 Copied from CRM [id number]. Topic: General - Other >> Jun 17, 2024  8:56 AM Jayma L wrote: Reason for CRM: patient called in and stated she wanted the office to know that her prolia  would be sent to the office through [employer].

## 2024-06-18 NOTE — Telephone Encounter (Signed)
 NOTED

## 2024-06-18 NOTE — Telephone Encounter (Signed)
 Spoke to [employer]. Prolia  will be delivered Tuesday 06/22/24. Once medication has been received we will schedule pt for a nurse appointment to receive Prolia  injection.   Lvm, will send mychart message.

## 2024-07-14 ENCOUNTER — Ambulatory Visit (INDEPENDENT_AMBULATORY_CARE_PROVIDER_SITE_OTHER)

## 2024-07-14 DIAGNOSIS — M810 Age-related osteoporosis without current pathological fracture: Secondary | ICD-10-CM

## 2024-07-14 MED ORDER — DENOSUMAB 60 MG/ML ~~LOC~~ SOSY
60.0000 mg | PREFILLED_SYRINGE | SUBCUTANEOUS | Status: AC
Start: 2024-07-14 — End: ?
  Administered 2024-07-14: 60 mg via SUBCUTANEOUS

## 2024-07-14 NOTE — Progress Notes (Signed)
 Patient was administered a Prolia  injection into her right arm. Patient tolerated the Prolia  injection well.

## 2024-07-28 ENCOUNTER — Ambulatory Visit (INDEPENDENT_AMBULATORY_CARE_PROVIDER_SITE_OTHER): Admitting: Nurse Practitioner

## 2024-07-28 ENCOUNTER — Encounter: Payer: Medicare PPO | Admitting: Family Medicine

## 2024-07-28 VITALS — BP 125/79 | HR 84 | Temp 98.3°F | Ht 59.02 in | Wt 137.1 lb

## 2024-07-28 DIAGNOSIS — N1831 Chronic kidney disease, stage 3a: Secondary | ICD-10-CM

## 2024-07-28 DIAGNOSIS — M810 Age-related osteoporosis without current pathological fracture: Secondary | ICD-10-CM

## 2024-07-28 DIAGNOSIS — E785 Hyperlipidemia, unspecified: Secondary | ICD-10-CM

## 2024-07-28 DIAGNOSIS — R7303 Prediabetes: Secondary | ICD-10-CM | POA: Diagnosis not present

## 2024-07-28 DIAGNOSIS — K219 Gastro-esophageal reflux disease without esophagitis: Secondary | ICD-10-CM

## 2024-07-28 LAB — COMPREHENSIVE METABOLIC PANEL WITH GFR
ALT: 16 U/L (ref 0–35)
AST: 15 U/L (ref 0–37)
Albumin: 4.4 g/dL (ref 3.5–5.2)
Alkaline Phosphatase: 64 U/L (ref 39–117)
BUN: 19 mg/dL (ref 6–23)
CO2: 29 meq/L (ref 19–32)
Calcium: 9.1 mg/dL (ref 8.4–10.5)
Chloride: 103 meq/L (ref 96–112)
Creatinine, Ser: 0.89 mg/dL (ref 0.40–1.20)
GFR: 61.91 mL/min (ref >60.00)
Glucose, Bld: 85 mg/dL (ref 70–99)
Potassium: 4.4 meq/L (ref 3.5–5.1)
Sodium: 139 meq/L (ref 135–145)
Total Bilirubin: 0.5 mg/dL (ref 0.2–1.2)
Total Protein: 7 g/dL (ref 6.0–8.3)

## 2024-07-28 LAB — LIPID PANEL
Cholesterol: 202 mg/dL — ABNORMAL HIGH (ref 0–200)
HDL: 66.1 mg/dL (ref >39.00)
LDL Cholesterol: 111 mg/dL — ABNORMAL HIGH (ref 0–99)
NonHDL: 136.15
Total CHOL/HDL Ratio: 3
Triglycerides: 125 mg/dL (ref 0.0–149.0)
VLDL: 25 mg/dL (ref 0.0–40.0)

## 2024-07-28 LAB — VITAMIN D 25 HYDROXY (VIT D DEFICIENCY, FRACTURES): VITD: 66.06 ng/mL (ref 30.00–100.00)

## 2024-07-28 LAB — HEMOGLOBIN A1C: Hgb A1c MFr Bld: 6.3 % (ref 4.6–6.5)

## 2024-07-28 NOTE — Progress Notes (Signed)
 Leron Glance, NP-C Phone: [phone]  Carol Stone is a 78 y.o. female who presents today for lab work.   Discussed the use of AI scribe software for clinical note transcription with the patient, who gave verbal consent to proceed.  History of Present Illness   Carol Stone is a 78 year old female who presents for blood work to check A1c, cholesterol, and vitamin D  levels.  She has a history of prediabetes and is here to check her A1c levels. Her diet varies, and she describes it as 'up and down.' She does not consider herself a big snacker and consumes ice cream a couple of times a week. She is not fasting today as her blood sugar sometimes 'bottoms out.' This morning, she had a cup of coffee and two Fig Newtons.  She is currently on Lipitor for cholesterol management but feels it has not been very effective.  She has a history of osteoporosis and is on Prolia . She takes vitamin D  and calcium  supplements regularly.  She uses Lasix  as needed and takes Protonix for acid reflux, which she feels works 'pretty well.' It has been about four years since her esophagus was last stretched, and she has an appointment for this issue. She is aware of her dietary triggers for acid reflux and tries to avoid them.  She has chronic kidney disease and had a comprehensive set of labs done in June for her MGUS check. She is due for a mammogram in October.  No chest pain, shortness of breath, abdominal pain, dizziness, or swelling. Swelling used to occur but now fluctuates.      Social History   Tobacco Use  Smoking Status Former  Smokeless Tobacco Never  Tobacco Comments   age [age]     Current Outpatient Medications on File Prior to Visit  Medication Sig Dispense Refill   atorvastatin  (LIPITOR) 10 MG tablet Take 1 tablet (10 mg total) by mouth daily at 6 PM. 90 tablet 3   calcium -vitamin D  (OSCAL WITH D) 500-200 MG-UNIT tablet Take 1 tablet by mouth.     diclofenac sodium  (VOLTAREN) 1 % GEL Apply topically 4 (four) times daily. Prn     furosemide  (LASIX ) 20 MG tablet Take 0.5 tablets (10 mg total) by mouth daily as needed. 90 tablet 3   Multiple Vitamin (MULTIVITAMIN) tablet Take 1 tablet by mouth daily.     pantoprazole (PROTONIX) 40 MG tablet Take 40 mg by mouth daily with supper.     Probiotic Product (ALIGN) 4 MG CAPS      RESTASIS 0.05 % ophthalmic emulsion PLACE 1 DROP INTO BOTH EYES TWICE A DAY  2   Current Facility-Administered Medications on File Prior to Visit  Medication Dose Route Frequency Provider Last Rate Last Admin   denosumab  (PROLIA ) injection 60 mg  60 mg Subcutaneous Once Glance Leron, NP       denosumab  (PROLIA ) injection 60 mg  60 mg Subcutaneous Q6 months Glance Leron, NP   60 mg at 07/14/24 1014     ROS see history of present illness  Objective  Physical Exam Vitals:   07/28/24 0959 07/28/24 1006  BP: (!) 145/79 125/79  Pulse: 84   Temp: 98.3 F (36.8 C)   SpO2: 98%     BP Readings from Last 3 Encounters:  07/28/24 125/79  03/15/24 136/76  03/02/24 132/72   Wt Readings from Last 3 Encounters:  07/28/24 137 lb 1.9 oz (62.2 kg)  03/15/24 137 lb (62.1  kg)  03/02/24 137 lb (62.1 kg)    Physical Exam Constitutional:      General: She is not in acute distress.    Appearance: Normal appearance.  HENT:     Head: Normocephalic.  Cardiovascular:     Rate and Rhythm: Normal rate and regular rhythm.     Heart sounds: Normal heart sounds.  Pulmonary:     Effort: Pulmonary effort is normal.     Breath sounds: Normal breath sounds.  Skin:    General: Skin is warm and dry.  Neurological:     General: No focal deficit present.     Mental Status: She is alert.  Psychiatric:        Mood and Affect: Mood normal.        Behavior: Behavior normal.      Assessment/Plan: Please see individual problem list.  Stage 3a chronic kidney disease (HCC) Assessment & Plan: Check CMP.   Orders: -     Comprehensive metabolic  panel with GFR  Prediabetes Assessment & Plan: Last A1c 5.9. Reports dietary fluctuations. Check A1c today. Encourage healthy diet and regular activity.   Orders: -     Hemoglobin A1c  Hyperlipidemia, unspecified hyperlipidemia type Assessment & Plan: Hyperlipidemia is managed with Lipitor. No side effects reported. Continue Lipitor 10 mg daily. Check lipid panel.   Orders: -     Lipid panel  Osteoporosis, unspecified osteoporosis type, unspecified pathological fracture presence Assessment & Plan: Osteoporosis is managed with Prolia , along with vitamin D  and calcium  supplementation. Check vitamin D  level.   Orders: -     VITAMIN D  25 Hydroxy (Vit-D Deficiency, Fractures)  Gastroesophageal reflux disease without esophagitis Assessment & Plan: GERD is controlled with Protonix. She has a history of esophageal stricture dilation 4 years ago. Appointment scheduled with Dr. Aundria for potential esophageal dilation. Continue Protonix daily, encourage to avoid dietary triggers.       Return for TOC as scheduled.   Leron Glance, NP-C [facility]

## 2024-07-30 ENCOUNTER — Ambulatory Visit: Payer: Self-pay | Admitting: Nurse Practitioner

## 2024-07-30 DIAGNOSIS — Z1231 Encounter for screening mammogram for malignant neoplasm of breast: Secondary | ICD-10-CM

## 2024-08-10 ENCOUNTER — Encounter: Payer: Self-pay | Admitting: Nurse Practitioner

## 2024-08-10 NOTE — Assessment & Plan Note (Signed)
 Osteoporosis is managed with Prolia , along with vitamin D  and calcium  supplementation. Check vitamin D  level.

## 2024-08-10 NOTE — Assessment & Plan Note (Signed)
 Hyperlipidemia is managed with Lipitor. No side effects reported. Continue Lipitor 10 mg daily. Check lipid panel.

## 2024-08-10 NOTE — Assessment & Plan Note (Signed)
 Check CMP.  ?

## 2024-08-10 NOTE — Assessment & Plan Note (Signed)
 GERD is controlled with Protonix. She has a history of esophageal stricture dilation 4 years ago. Appointment scheduled with Dr. Aundria for potential esophageal dilation. Continue Protonix daily, encourage to avoid dietary triggers.

## 2024-08-10 NOTE — Assessment & Plan Note (Signed)
 Last A1c 5.9. Reports dietary fluctuations. Check A1c today. Encourage healthy diet and regular activity.

## 2024-10-05 ENCOUNTER — Ambulatory Visit
Admission: RE | Admit: 2024-10-05 | Discharge: 2024-10-05 | Disposition: A | Discharge status: Home / Self Care | Source: Ambulatory Visit | Attending: Nurse Practitioner | Admitting: Nurse Practitioner

## 2024-10-05 DIAGNOSIS — Z1231 Encounter for screening mammogram for malignant neoplasm of breast: Secondary | ICD-10-CM | POA: Insufficient documentation

## 2024-10-07 ENCOUNTER — Ambulatory Visit: Payer: Self-pay | Admitting: Nurse Practitioner

## 2024-10-28 DIAGNOSIS — M79645 Pain in left finger(s): Secondary | ICD-10-CM | POA: Diagnosis not present

## 2024-10-28 DIAGNOSIS — M65312 Trigger thumb, left thumb: Secondary | ICD-10-CM | POA: Diagnosis not present

## 2024-10-28 DIAGNOSIS — M1812 Unilateral primary osteoarthritis of first carpometacarpal joint, left hand: Secondary | ICD-10-CM | POA: Diagnosis not present

## 2024-11-09 DIAGNOSIS — G43109 Migraine with aura, not intractable, without status migrainosus: Secondary | ICD-10-CM | POA: Diagnosis not present

## 2024-11-09 DIAGNOSIS — H4322 Crystalline deposits in vitreous body, left eye: Secondary | ICD-10-CM | POA: Diagnosis not present

## 2024-12-21 ENCOUNTER — Telehealth: Payer: Self-pay

## 2024-12-21 ENCOUNTER — Other Ambulatory Visit (HOSPITAL_COMMUNITY): Payer: Self-pay

## 2024-12-21 DIAGNOSIS — R918 Other nonspecific abnormal finding of lung field: Secondary | ICD-10-CM | POA: Insufficient documentation

## 2024-12-21 NOTE — Telephone Encounter (Signed)
 Prolia  VOB initiated via [email or web address]  Next Prolia  inj DUE: 01/14/25   PHARMACY COPAY: $60

## 2024-12-22 ENCOUNTER — Other Ambulatory Visit (HOSPITAL_COMMUNITY): Payer: Self-pay

## 2024-12-22 NOTE — Telephone Encounter (Addendum)
 MEDICAL PA SUBMITTED VIA LATENT. KEY: [id number]

## 2024-12-22 NOTE — Telephone Encounter (Signed)
 SABRA

## 2024-12-23 NOTE — Telephone Encounter (Signed)
 Pt ready for scheduling for PROLIA  on or after : 01/14/25  Option# 1: Buy/Bill (Office supplied medication)  Out-of-pocket cost due at time of clinic visit: $569  Number of injection/visits approved: 2  Primary: [employer] Prolia  co-insurance: 20% Admin fee co-insurance: 20%  Secondary: --- Prolia  co-insurance:  Admin fee co-insurance:   Medical Benefit Details: Date Benefits were checked: 12/21/24 Deductible: $0 Met of $192 Required/ Coinsurance: 20%/ Admin Fee: 20%  Prior Auth: APPROVED PA# [id number] Expiration Date: 12/22/24-12/08/25   # of doses approved: 2  ----------------------------------------------------------------------- Option# 2- Med Obtained from pharmacy:  Pharmacy benefit: Copay $60 (Paid to pharmacy) Admin Fee: 20% (Pay at clinic)  Prior Auth: N/A PA# Expiration Date:   # of doses approved:   If patient wants fill through the pharmacy benefit please send prescription to: [facility], and include estimated need by date in rx notes. Pharmacy will ship medication directly to the office.  Patient NOT eligible for Prolia  Copay Card. Copay Card can make patient's cost as little as $25. Link to apply: [email or web address]://[email or web address].[email or web address]  ** This summary of benefits is an estimation of the patient's out-of-pocket cost. Exact cost may very based on individual plan coverage.

## 2024-12-29 ENCOUNTER — Ambulatory Visit: Attending: Cardiology | Admitting: Cardiology

## 2024-12-29 ENCOUNTER — Encounter: Payer: Self-pay | Admitting: Cardiology

## 2024-12-29 VITALS — BP 130/67 | HR 100 | Ht 59.0 in | Wt 137.6 lb

## 2024-12-29 DIAGNOSIS — E7800 Pure hypercholesterolemia, unspecified: Secondary | ICD-10-CM | POA: Diagnosis not present

## 2024-12-29 DIAGNOSIS — I050 Rheumatic mitral stenosis: Secondary | ICD-10-CM

## 2024-12-29 DIAGNOSIS — I349 Nonrheumatic mitral valve disorder, unspecified: Secondary | ICD-10-CM | POA: Diagnosis not present

## 2024-12-29 MED ORDER — ATORVASTATIN CALCIUM 20 MG PO TABS: 20.0000 mg | ORAL_TABLET | Freq: Every day | ORAL | 3 refills | Status: AC

## 2024-12-29 NOTE — Progress Notes (Signed)
 " Cardiology Office Note:    Date:  12/29/2024   ID:  Carol Stone, DOB Jul 12, 1946, MRN [id number]  PCP:  Abbey Bruckner, MD  Cardiologist:  Redell Cave, MD  Electrophysiologist:  None   Referring MD: Abbey Bruckner, MD   Chief Complaint  Patient presents with   Follow-up    12 month follow up pt has been doing well with no complaints of chest pain, chest pressure or SOB, medication reviewed verbally with patient    History of Present Illness:    Carol Stone is a 79 y.o. female with a hx of mild mitral stenosis, hyperlipidemia, varicose veins who presents for follow-up.    Doing okay, feels well, denies chest pain or shortness of breath.  Denies syncope.  Compliant with medications as prescribed.  Recent cholesterol showed elevated levels.  Feels well, has no cardiac concerns at this time.   Prior notes Echo 11/2021, EF 60%, mild mitral stenosis, mean gradient 3.7 mmHg Echo 11/07/2020 EF 55 to 60%, mild mitral stenosis mean gradient 4 mmHg.    Past Medical History:  Diagnosis Date   Allergy    Aortic calcification    Arthritis    Arthritis of lumbar spine 04/02/2021   Arthritis of neck 04/02/2021   Back spasm 08/22/2021   Capillaritis 03/23/2019   Cataract    Cervicalgia 12/26/2021   Decreased GFR 07/09/2021   06/26/21 GFR 48     Early satiety 01/17/2021   Elevated blood pressure reading 11/23/2018   Fracture Risk Assessment Score (FRAX) indicating greater than 3% risk for hip fracture 05/24/2022   Frequent headaches    seasonal   GERD (gastroesophageal reflux disease)    Grade II diastolic dysfunction    Hiatal hernia    History of chicken pox    History of colon polyps    History of osteoporosis 05/24/2022   Hypercalcemia 07/09/2021   Hypercholesteremia    Hypoglycemia 01/17/2021   IBS (irritable bowel syndrome)    constipation    Immunization reaction 01/13/2020   Impingement syndrome of left shoulder 06/25/2020   Dr. Leora see 06/21/20  [facility]    Insomnia 11/23/2018   Laryngopharyngeal reflux    Leg edema 01/13/2020   Lymphedema 01/17/2021   MGUS (monoclonal gammopathy of unknown significance)    Mild mitral regurgitation by prior echocardiogram    Neuropathy 08/22/2021   Numbness of toes 01/17/2021   OSA on CPAP    Osteopenia 05/24/2022   Osteoporosis    PONV (postoperative nausea and vomiting)    Sinus tachycardia 07/13/2020   Sleep apnea    Subconjunctival hemorrhage of left eye 01/17/2021   Late 12/2020 resolved after 2 weeks    Thoracic spondylosis without myelopathy 10/18/2021   Tinnitus    Venous insufficiency     Past Surgical History:  Procedure Laterality Date   ABDOMINAL HYSTERECTOMY     carpel tunnel surgery-left Left    July 2023   CATARACT EXTRACTION W/PHACO Left 02/17/2024   Procedure: CATARACT EXTRACTION PHACO AND INTRAOCULAR LENS PLACEMENT (IOC) LEFT;  Surgeon: Jaye Fallow, MD;  Location: [facility];  Service: Ophthalmology;  Laterality: Left;  5.07 0:36.3   CATARACT EXTRACTION W/PHACO Right 03/02/2024   Procedure: CATARACT EXTRACTION PHACO AND INTRAOCULAR LENS PLACEMENT (IOC) RIGHT 5.32 00:34.0;  Surgeon: Jaye Fallow, MD;  Location: [facility];  Service: Ophthalmology;  Laterality: Right;   COLONOSCOPY WITH PROPOFOL  N/A 03/14/2016   Procedure: COLONOSCOPY WITH PROPOFOL ;  Surgeon: Deward CINDERELLA Piedmont, MD;  Location:  [facility];  Service: Gastroenterology;  Laterality: N/A;   DILATION AND CURETTAGE OF UTERUS     TONSILLECTOMY      Current Medications: Current Meds  Medication Sig   calcium -vitamin D  (OSCAL WITH D) 500-200 MG-UNIT tablet Take 1 tablet by mouth.   diclofenac sodium (VOLTAREN) 1 % GEL Apply topically 4 (four) times daily. Prn   Multiple Vitamin (MULTIVITAMIN) tablet Take 1 tablet by mouth daily.   pantoprazole (PROTONIX) 40 MG tablet Take 40 mg by mouth daily with supper.   Probiotic Product (ALIGN) 4 MG CAPS    RESTASIS 0.05 % ophthalmic emulsion  PLACE 1 DROP INTO BOTH EYES TWICE A DAY   [DISCONTINUED] atorvastatin  (LIPITOR) 10 MG tablet Take 1 tablet (10 mg total) by mouth daily at 6 PM.   Current Facility-Administered Medications for the 12/29/24 encounter (Office Visit) with Darliss Rogue, MD  Medication   denosumab  (PROLIA ) injection 60 mg   denosumab  (PROLIA ) injection 60 mg     Allergies:   Bactrim [sulfamethoxazole-trimethoprim], Codeine, Compazine [prochlorperazine edisylate], Demerol [meperidine], Indocin [indomethacin], Other, and Reglan [metoclopramide]   Social History   Socioeconomic History   Marital status: Married    Spouse name: Not on file   Number of children: Not on file   Years of education: Not on file   Highest education level: Not on file  Occupational History   Not on file  Tobacco Use   Smoking status: Former   Smokeless tobacco: Never   Tobacco comments:    age [age] light   Vaping Use   Vaping status: Never Used  Substance and Sexual Activity   Alcohol use: No   Drug use: No   Sexual activity: Not Currently    Birth control/protection: Surgical  Other Topics Concern   Not on file  Social History Narrative   Married, husband in house    Wears seat belt, husband owns guns, safe in relationship    No kids    Masters, previous nurse office work    No kids    Retired    Social Drivers of Health   Tobacco Use: Medium Risk (12/29/2024)   Patient History    Smoking Tobacco Use: Former    Smokeless Tobacco Use: Never    Passive Exposure: Not on Actuary Strain: Low Risk (01/26/2024)   Overall Financial Resource Strain (CARDIA)    Difficulty of Paying Living Expenses: Not hard at all  Food Insecurity: No Food Insecurity (01/26/2024)   Hunger Vital Sign    Worried About Running Out of Food in the Last Year: Never true    Ran Out of Food in the Last Year: Never true  Transportation Needs: No Transportation Needs (01/26/2024)   PRAPARE - Scientist, Research (physical Sciences) (Medical): No    Lack of Transportation (Non-Medical): No  Physical Activity: Sufficiently Active (01/26/2024)   Exercise Vital Sign    Days of Exercise per Week: 7 days    Minutes of Exercise per Session: 50 min  Stress: No Stress Concern Present (01/26/2024)   [employer] of Occupational Health - Occupational Stress Questionnaire    Feeling of Stress : Not at all  Social Connections: Socially Integrated (01/26/2024)   Social Connection and Isolation Panel    Frequency of Communication with Friends and Family: More than three times a week    Frequency of Social Gatherings with Friends and Family: More than three times a week    Attends Religious Services: More  than 4 times per year    Active Member of Clubs or Organizations: Yes    Attends Banker Meetings: More than 4 times per year    Marital Status: Married  Depression (PHQ2-9): Low Risk (03/15/2024)   Depression (PHQ2-9)    PHQ-2 Score: 0  Alcohol Screen: Low Risk (01/26/2024)   Alcohol Screen    Last Alcohol Screening Score (AUDIT): 0  Housing: Unknown (01/26/2024)   Housing Stability Vital Sign    Unable to Pay for Housing in the Last Year: No    Number of Times Moved in the Last Year: Not on file    Homeless in the Last Year: No  Utilities: Not At Risk (01/26/2024)   AHC Utilities    Threatened with loss of utilities: No  Health Literacy: Adequate Health Literacy (01/26/2024)   B1300 Health Literacy    Frequency of need for help with medical instructions: Never     Family History: The patient's family history includes AAA (abdominal aortic aneurysm) in her paternal grandmother; Breast cancer (age of onset: 34) in her maternal aunt; Cancer in her father, maternal aunt, paternal grandfather, and sister; Deep vein thrombosis in her sister; Heart disease in her maternal grandmother; Hypertension in her father and paternal grandmother; Lung cancer in her sister; Osteoporosis in her mother; Prostate  cancer in her father; Pulmonary embolism in her sister; Stroke in her mother.  ROS:   Please see the history of present illness.     All other systems reviewed and are negative.  EKGs/Labs/Other Studies Reviewed:    The following studies were reviewed today:   EKG Interpretation Date/Time:  Wednesday December 29 2024 08:17:25 EST Ventricular Rate:  100 PR Interval:  152 QRS Duration:  80 QT Interval:  332 QTC Calculation: 428 R Axis:   -5  Text Interpretation: Normal sinus rhythm Normal ECG Confirmed by Darliss Rogue ([id number]) on 12/29/2024 8:45:18 AM    Recent Labs: 07/28/2024: ALT 16; BUN 19; Creatinine, Ser 0.89; Potassium 4.4; Sodium 139  Recent Lipid Panel    Component Value Date/Time   CHOL 202 (H) 07/28/2024 1057   TRIG 125.0 07/28/2024 1057   HDL 66.10 07/28/2024 1057   CHOLHDL 3 07/28/2024 1057   VLDL 25.0 07/28/2024 1057   LDLCALC 111 (H) 07/28/2024 1057    Physical Exam:    VS:  BP 130/67 (BP Location: Left Arm, Patient Position: Sitting, Cuff Size: Large)   Pulse 100   Ht 4' 11 (1.499 m)   Wt 137 lb 9.6 oz (62.4 kg)   SpO2 98%   BMI 27.79 kg/m     Wt Readings from Last 3 Encounters:  12/29/24 137 lb 9.6 oz (62.4 kg)  07/28/24 137 lb 1.9 oz (62.2 kg)  03/15/24 137 lb (62.1 kg)     GEN:  Well nourished, well developed in no acute distress HEENT: Normal NECK: No JVD; No carotid bruits CARDIAC: RRR, 2/6 systolic murmur RESPIRATORY:  Clear to auscultation without rales, wheezing or rhonchi  ABDOMEN: Soft, non-tender, non-distended MUSCULOSKELETAL:  No edema; varicose veins noted SKIN: Warm and dry NEUROLOGIC:  Alert and oriented x 3 PSYCHIATRIC:  Normal affect   ASSESSMENT:    1. Mitral valve stenosis, unspecified etiology   2. Pure hypercholesterolemia    PLAN:    In order of problems listed above:  Mild mitral stenosis, repeat echo 11/2023 showed normal EF 60-65%, mild mitral stenosis, mean mitral gradient 3.5 mmHg.  Not significantly  different from prior in 2022.  Repeat echo in 1 year. Hyperlipidemia, cholesterol elevated.  Increase Lipitor to 20 mg daily.   Follow-up in 1 year.   Medication Adjustments/Labs and Tests Ordered: Current medicines are reviewed at length with the patient today.  Concerns regarding medicines are outlined above.  Orders Placed This Encounter  Procedures   EKG 12-Lead   ECHOCARDIOGRAM COMPLETE   Meds ordered this encounter  Medications   atorvastatin  (LIPITOR) 20 MG tablet    Sig: Take 1 tablet (20 mg total) by mouth daily.    Dispense:  30 tablet    Refill:  3    Patient Instructions  Medication Instructions:  - INCREASE lipitor to 20 mg daily   *If you need a refill on your cardiac medications before your next appointment, please call your pharmacy*  Lab Work: No labs ordered today  If you have labs (blood work) drawn today and your tests are completely normal, you will receive your results only by: MyChart Message (if you have MyChart) OR A paper copy in the mail If you have any lab test that is abnormal or we need to change your treatment, we will call you to review the results.  Testing/Procedures: Your physician has requested that you have an echocardiogram in 1 year. Echocardiography is a painless test that uses sound waves to create images of your heart. It provides your doctor with information about the size and shape of your heart and how well your hearts chambers and valves are working.   You may receive an ultrasound enhancing agent through an IV if needed to better visualize your heart during the echo. This procedure takes approximately one hour.  There are no restrictions for this procedure.  This will take place at [address] [facility] [address]) [address], [place] [address]  Please note: We ask at that you not bring children with you during ultrasound (echo/ vascular) testing. Due to room size and safety concerns, children are not allowed in the  ultrasound rooms during exams. Our front office staff cannot provide observation of children in our lobby area while testing is being conducted. An adult accompanying a patient to their appointment will only be allowed in the ultrasound room at the discretion of the ultrasound technician under special circumstances. We apologize for any inconvenience.   Follow-Up: At [facility], you and your health needs are our priority.  As part of our continuing mission to provide you with exceptional heart care, our providers are all part of one team.  This team includes your primary Cardiologist (physician) and Advanced Practice Providers or APPs (Physician Assistants and Nurse Practitioners) who all work together to provide you with the care you need, when you need it.  Your next appointment:   1 year(s)  Provider:   You may see Redell Cave, MD or one of the following Advanced Practice Providers on your designated Care Team:   Lonni Meager, NP Lesley Maffucci, PA-C Bernardino Bring, PA-C Cadence Spencer, PA-C Tylene Lunch, NP Barnie Hila, NP    We recommend signing up for the patient portal called MyChart.  Sign up information is provided on this After Visit Summary.  MyChart is used to connect with patients for Virtual Visits (Telemedicine).  Patients are able to view lab/test results, encounter notes, upcoming appointments, etc.  Non-urgent messages can be sent to your provider as well.   To learn more about what you can do with MyChart, go to [email or web address].  Signed, Redell Cave, MD  12/29/2024 9:27 AM    [facility] "

## 2024-12-29 NOTE — Patient Instructions (Signed)
 Medication Instructions:  - INCREASE lipitor to 20 mg daily   *If you need a refill on your cardiac medications before your next appointment, please call your pharmacy*  Lab Work: No labs ordered today  If you have labs (blood work) drawn today and your tests are completely normal, you will receive your results only by: MyChart Message (if you have MyChart) OR A paper copy in the mail If you have any lab test that is abnormal or we need to change your treatment, we will call you to review the results.  Testing/Procedures: Your physician has requested that you have an echocardiogram in 1 year. Echocardiography is a painless test that uses sound waves to create images of your heart. It provides your doctor with information about the size and shape of your heart and how well your hearts chambers and valves are working.   You may receive an ultrasound enhancing agent through an IV if needed to better visualize your heart during the echo. This procedure takes approximately one hour.  There are no restrictions for this procedure.  This will take place at [address] [facility] [address]) [address], [place] [address]  Please note: We ask at that you not bring children with you during ultrasound (echo/ vascular) testing. Due to room size and safety concerns, children are not allowed in the ultrasound rooms during exams. Our front office staff cannot provide observation of children in our lobby area while testing is being conducted. An adult accompanying a patient to their appointment will only be allowed in the ultrasound room at the discretion of the ultrasound technician under special circumstances. We apologize for any inconvenience.   Follow-Up: At [facility], you and your health needs are our priority.  As part of our continuing mission to provide you with exceptional heart care, our providers are all part of one team.  This team includes your primary Cardiologist (physician) and  Advanced Practice Providers or APPs (Physician Assistants and Nurse Practitioners) who all work together to provide you with the care you need, when you need it.  Your next appointment:   1 year(s)  Provider:   You may see Redell Cave, MD or one of the following Advanced Practice Providers on your designated Care Team:   Lonni Meager, NP Lesley Maffucci, PA-C Bernardino Bring, PA-C Cadence [place], PA-C Tylene Lunch, NP Barnie Hila, NP    We recommend signing up for the patient portal called MyChart.  Sign up information is provided on this After Visit Summary.  MyChart is used to connect with patients for Virtual Visits (Telemedicine).  Patients are able to view lab/test results, encounter notes, upcoming appointments, etc.  Non-urgent messages can be sent to your provider as well.   To learn more about what you can do with MyChart, go to [email or web address].

## 2025-01-04 ENCOUNTER — Other Ambulatory Visit: Payer: Self-pay | Admitting: Internal Medicine

## 2025-01-04 ENCOUNTER — Telehealth: Payer: Self-pay

## 2025-01-04 ENCOUNTER — Ambulatory Visit

## 2025-01-04 VITALS — BP 104/82 | HR 102 | Temp 98.4°F | Ht 58.25 in | Wt 137.8 lb

## 2025-01-04 DIAGNOSIS — E785 Hyperlipidemia, unspecified: Secondary | ICD-10-CM

## 2025-01-04 DIAGNOSIS — M810 Age-related osteoporosis without current pathological fracture: Secondary | ICD-10-CM | POA: Diagnosis not present

## 2025-01-04 DIAGNOSIS — R7303 Prediabetes: Secondary | ICD-10-CM

## 2025-01-04 DIAGNOSIS — K449 Diaphragmatic hernia without obstruction or gangrene: Secondary | ICD-10-CM

## 2025-01-04 MED ORDER — PROLIA 60 MG/ML ~~LOC~~ SOSY: 60.0000 mg | PREFILLED_SYRINGE | SUBCUTANEOUS | 0 refills | Status: AC

## 2025-01-04 NOTE — Assessment & Plan Note (Signed)
 Contributing to GERD, stable on Protonix 40 mg daily.  Continue follow-up with Dr. Aundria.

## 2025-01-04 NOTE — Assessment & Plan Note (Addendum)
 Tolerating atorvastatin  20 mg daily as prescribed by cardiologist.  Continue.

## 2025-01-04 NOTE — Assessment & Plan Note (Addendum)
 Vitamin D , calcium  from 07/28/2024 normal.  Continue every 6 monthly Prolia  for osteoporosis.  Repeat vitamin D , CMP prior to Prolia  injection.  Future lab ordered.  Recommend scheduling nurse visit for repeat DEXA.  Repeat DEXA ordered. Next injection due after 01/14/25.  Orders:   Comprehensive metabolic panel with GFR; Future   DG Bone Density; Future   Vitamin D  (25 hydroxy); Future

## 2025-01-04 NOTE — Assessment & Plan Note (Deleted)
" °  Orders:   Comprehensive metabolic panel with GFR; Future  "

## 2025-01-04 NOTE — Telephone Encounter (Signed)
 1. Osteoporosis, unspecified osteoporosis type, unspecified pathological fracture presence - Prescription sent to:  [facility] ([facility]) - [place], [place] - [phone]   - denosumab  (PROLIA ) 60 MG/ML SOSY injection; Inject 60 mg into the skin every 6 (six) months.  Dispense: 1 mL; Refill: 0

## 2025-01-04 NOTE — Patient Instructions (Addendum)
 YOUR BONE DENISTY SCAN (dexa)  IS DUE, PLEASE CALL AND GET THIS SCHEDULED! [facility] - call [phone]  at your convenience.   Schedule nurse visit for Prolia  after: 01/14/25. Please get labs done couple of days before your injection to repeat vitamin D , kidney, liver and calcium  check. We will also repeat your A1c at that time.   Schedule nurse visit for Prolia  after: 01/14/25, labs couple of days before Prolia  injection.  Follow up with Dr. Abbey in 6 months or sooner if you need me.

## 2025-01-04 NOTE — Progress Notes (Signed)
 "  Established Patient Office Visit TOC from Dr. Hope    Subjective  Patient ID: Carol Stone, female    DOB: 02-17-1946  Age: 79 y.o. MRN: [id number]  Chief Complaint  Patient presents with   Establish Care    Discussed the use of AI scribe software for clinical note transcription with the patient, who gave verbal consent to proceed.   History of Present Illness Carol Stone is a 79 year old female who presents for a routine follow-up visit.  She is currently taking atorvastatin  20 mg, calcium , vitamin D , and receives Prolia  injections every six months. Her atorvastatin  dosage was recently increased, and she is tolerating it well. She uses Lasix  20 mg as needed for swelling in her ankles and arms.  She has a history of plugged ears and postnasal drip, for which she uses a nasal spray every other day. Her ears have been plugged for forty years, and she has not found a medication that helps since [place] was taken off the market.  She experiences acid reflux due to known history of hiatal hernia and takes pantoprazole 40 mg daily which has been helpful.  She follows up with Dr. Aundria for GI concerns.    She was retested for sleep apnea two years ago, and the results were negative, although she still occasionally wakes herself up with snoring. Her trigger finger was treated with an injection, which resolved the issue, and she had surgery for carpal tunnel syndrome.  She used to experience chronic constipation but has managed it with dietary changes. She does not currently have back pain. She is on treatment for osteoporosis and continues with Prolia  injections.  Her A1c was 6.3, and she is working on dietary changes to manage this. She walks two miles daily and is trying to improve her diet by reducing ice cream intake and incorporating more whole grains and sweet potatoes. No current back pain, no chronic constipation, no wrist or thumb pain after injection for trigger  finger.    ROS As per HPI    Objective:     BP 104/82 (BP Location: Left Arm, Patient Position: Sitting)   Pulse (!) 102   Temp 98.4 F (36.9 C) (Oral)   Ht 4' 10.25 (1.48 m)   Wt 137 lb 12.8 oz (62.5 kg)   SpO2 99%   BMI 28.55 kg/m      01/04/2025   11:27 AM 03/15/2024    8:17 AM 01/26/2024    1:51 PM  Depression screen PHQ 2/9  Decreased Interest 0 0 0  Down, Depressed, Hopeless 0 0 0  PHQ - 2 Score 0 0 0  Altered sleeping 0 0 0  Tired, decreased energy 0 0 0  Change in appetite 0 0 0  Feeling bad or failure about yourself  0 0 0  Trouble concentrating 0 0 0  Moving slowly or fidgety/restless 0 0 0  Suicidal thoughts 0 0 0  PHQ-9 Score 0 0  0   Difficult doing work/chores Not difficult at all Not difficult at all Not difficult at all     Data saved with a previous flowsheet row definition      01/04/2025   11:26 AM 03/15/2024    8:17 AM 07/24/2023    8:22 AM 01/14/2023    2:34 PM  GAD 7 : Generalized Anxiety Score  Nervous, Anxious, on Edge 0 0  0  0   Control/stop worrying 0 0  0  0  Worry too much - different things 0 0  0  0   Trouble relaxing 0 0  0  0   Restless 0 0  0  0   Easily annoyed or irritable 0 0  0  0   Afraid - awful might happen 0 0  0  0   Total GAD 7 Score 0 0 0 0  Anxiety Difficulty Not difficult at all Not difficult at all Not difficult at all Not difficult at all     Data saved with a previous flowsheet row definition      01/04/2025   11:27 AM 03/15/2024    8:17 AM 01/26/2024    1:51 PM  Depression screen PHQ 2/9  Decreased Interest 0 0 0  Down, Depressed, Hopeless 0 0 0  PHQ - 2 Score 0 0 0  Altered sleeping 0 0 0  Tired, decreased energy 0 0 0  Change in appetite 0 0 0  Feeling bad or failure about yourself  0 0 0  Trouble concentrating 0 0 0  Moving slowly or fidgety/restless 0 0 0  Suicidal thoughts 0 0 0  PHQ-9 Score 0 0  0   Difficult doing work/chores Not difficult at all Not difficult at all Not difficult at all      Data saved with a previous flowsheet row definition      01/04/2025   11:26 AM 03/15/2024    8:17 AM 07/24/2023    8:22 AM 01/14/2023    2:34 PM  GAD 7 : Generalized Anxiety Score  Nervous, Anxious, on Edge 0 0  0  0   Control/stop worrying 0 0  0  0   Worry too much - different things 0 0  0  0   Trouble relaxing 0 0  0  0   Restless 0 0  0  0   Easily annoyed or irritable 0 0  0  0   Afraid - awful might happen 0 0  0  0   Total GAD 7 Score 0 0 0 0  Anxiety Difficulty Not difficult at all Not difficult at all Not difficult at all Not difficult at all     Data saved with a previous flowsheet row definition   SDOH Screenings   Food Insecurity: No Food Insecurity (01/03/2025)  Housing: Unknown (01/03/2025)  Transportation Needs: No Transportation Needs (01/03/2025)  Utilities: Not At Risk (01/26/2024)  Alcohol Screen: Low Risk (01/26/2024)  Depression (PHQ2-9): Low Risk (01/04/2025)  Financial Resource Strain: Low Risk (01/03/2025)  Physical Activity: Sufficiently Active (01/03/2025)  Social Connections: Socially Integrated (01/03/2025)  Stress: No Stress Concern Present (01/03/2025)  Tobacco Use: Medium Risk (01/04/2025)  Health Literacy: Adequate Health Literacy (01/26/2024)     Physical Exam Constitutional:      General: She is not in acute distress.    Appearance: Normal appearance.  HENT:     Head: Normocephalic and atraumatic.     Right Ear: Tympanic membrane normal.     Left Ear: Tympanic membrane normal.     Mouth/Throat:     Mouth: Mucous membranes are moist.  Neck:     Thyroid : No thyroid  mass or thyroid  tenderness.  Cardiovascular:     Rate and Rhythm: Normal rate and regular rhythm.     Heart sounds: No murmur heard. Pulmonary:     Effort: Pulmonary effort is normal.     Breath sounds: Normal breath sounds. No wheezing.  Abdominal:     General: Bowel  sounds are normal.     Palpations: Abdomen is soft.     Tenderness: There is no abdominal tenderness. There is no  guarding.  Musculoskeletal:     Cervical back: Neck supple. No rigidity.     Right lower leg: No edema.     Left lower leg: No edema.  Skin:    General: Skin is warm.  Neurological:     Mental Status: She is alert and oriented to person, place, and time.  Psychiatric:        Mood and Affect: Mood normal.        Behavior: Behavior normal.        No results found for any visits on 01/04/25.  The 10-year ASCVD risk score (Arnett DK, et al., 2019) is: 22.4%     Assessment & Plan:  Patient is a pleasant 79 year old female presenting for transfer of care from previous PCP.  She takes Lasix  as needed 20 mg for lower leg, bilateral upper limb intermittent swelling.  Continue.  Not needing refill at this time.  Continue follow-up with [facility] for history of multiple myeloma in remission.  Next appointment in June 2026. Assessment & Plan Prediabetes Discussed continuing lifestyle modification including low carbohydrate diet, regular physical activity to reduce risk of progression into diabetes.  Will repeat A1c with her next set of labs.  Future lab ordered. Orders:   Hemoglobin A1c; Future  Hyperlipidemia, unspecified hyperlipidemia type Tolerating atorvastatin  20 mg daily as prescribed by cardiologist.  Continue.    Osteoporosis, unspecified osteoporosis type, unspecified pathological fracture presence Vitamin D , calcium  from 07/28/2024 normal.  Continue every 6 monthly Prolia  for osteoporosis.  Repeat vitamin D , CMP prior to Prolia  injection.  Future lab ordered.  Recommend scheduling nurse visit for repeat DEXA.  Repeat DEXA ordered. Next injection due after 01/14/25.  Orders:   Comprehensive metabolic panel with GFR; Future   DG Bone Density; Future   Vitamin D  (25 hydroxy); Future  Sliding hiatal hernia Contributing to GERD, stable on Protonix 40 mg daily.  Continue follow-up with Dr. Aundria.     Follow up: Schedule nurse visit for Prolia  after: 01/14/25, labs couple of  days before Prolia  injection.  Follow up with Dr. Abbey in 6 months or sooner if you need me.   Luke Abbey, MD "

## 2025-01-04 NOTE — Telephone Encounter (Signed)
 Copied from CRM #[id number]. Topic: Clinical - Prescription Issue >> Jan 04, 2025  1:41 PM Alfonso HERO wrote: Reason for CRM: patient was told by [facility] pharmacy that a new Rx is needed for the prolia  injection.... Pt is asking for a call back once it has been sent.

## 2025-01-04 NOTE — Assessment & Plan Note (Deleted)
 SABRA

## 2025-01-04 NOTE — Assessment & Plan Note (Addendum)
 Discussed continuing lifestyle modification including low carbohydrate diet, regular physical activity to reduce risk of progression into diabetes.  Will repeat A1c with her next set of labs.  Future lab ordered. Orders:   Hemoglobin A1c; Future

## 2025-01-11 NOTE — Telephone Encounter (Signed)
 Noted.

## 2025-01-11 NOTE — Telephone Encounter (Signed)
 Called and spoke with Gwenn HERO with [facility].  Delivery for Prolia  set up for Thursday Feb 5th.

## 2025-01-14 ENCOUNTER — Ambulatory Visit: Payer: Self-pay

## 2025-01-14 ENCOUNTER — Other Ambulatory Visit: Payer: Self-pay | Admitting: Internal Medicine

## 2025-01-14 ENCOUNTER — Ambulatory Visit: Admission: RE | Admit: 2025-01-14 | Source: Ambulatory Visit

## 2025-01-14 ENCOUNTER — Telehealth: Payer: Self-pay

## 2025-01-14 DIAGNOSIS — M810 Age-related osteoporosis without current pathological fracture: Secondary | ICD-10-CM

## 2025-01-14 DIAGNOSIS — R1314 Dysphagia, pharyngoesophageal phase: Secondary | ICD-10-CM

## 2025-01-14 NOTE — Telephone Encounter (Signed)
 Patient Prolia  medication was delivered to the office. Patient is notified and scheduled for an Nurse Visit.

## 2025-01-14 NOTE — Telephone Encounter (Signed)
 Noted.

## 2025-01-17 ENCOUNTER — Other Ambulatory Visit

## 2025-01-19 ENCOUNTER — Ambulatory Visit

## 2025-01-21 ENCOUNTER — Ambulatory Visit

## 2025-01-28 ENCOUNTER — Ambulatory Visit: Payer: Medicare PPO

## 2025-02-02 ENCOUNTER — Encounter

## 2025-02-02 ENCOUNTER — Ambulatory Visit: Admit: 2025-02-02 | Admitting: Internal Medicine

## 2025-07-04 ENCOUNTER — Ambulatory Visit

## 2025-12-29 ENCOUNTER — Ambulatory Visit
